# Patient Record
Sex: Male | Born: 1955 | Race: White | Hispanic: No | Marital: Married | Smoking: Former smoker
Health system: Southern US, Community
[De-identification: ages and names within clinical notes are randomized; demographics above are authoritative.]

## PROBLEM LIST (undated history)

## (undated) DIAGNOSIS — E785 Hyperlipidemia, unspecified: Secondary | ICD-10-CM

## (undated) DIAGNOSIS — I739 Peripheral vascular disease, unspecified: Secondary | ICD-10-CM

## (undated) DIAGNOSIS — I10 Essential (primary) hypertension: Secondary | ICD-10-CM

## (undated) DIAGNOSIS — M359 Systemic involvement of connective tissue, unspecified: Secondary | ICD-10-CM

---

## 2012-05-10 ENCOUNTER — Ambulatory Visit: Payer: Self-pay | Admitting: Internal Medicine

## 2015-11-01 ENCOUNTER — Other Ambulatory Visit: Payer: Self-pay | Admitting: Vascular Surgery

## 2015-11-05 ENCOUNTER — Other Ambulatory Visit
Admission: RE | Admit: 2015-11-05 | Discharge: 2015-11-05 | Disposition: A | Discharge status: Home / Self Care | Payer: BLUE CROSS/BLUE SHIELD | Source: Ambulatory Visit | Attending: Vascular Surgery | Admitting: Vascular Surgery

## 2015-11-05 DIAGNOSIS — I70213 Atherosclerosis of native arteries of extremities with intermittent claudication, bilateral legs: Secondary | ICD-10-CM | POA: Insufficient documentation

## 2015-11-05 LAB — CREATININE, SERUM: CREATININE: 0.96 mg/dL (ref 0.61–1.24)

## 2015-11-05 LAB — BUN: BUN: 13 mg/dL (ref 6–20)

## 2015-11-08 ENCOUNTER — Ambulatory Visit
Admission: RE | Admit: 2015-11-08 | Discharge: 2015-11-08 | Disposition: A | Discharge status: Home / Self Care | Payer: BLUE CROSS/BLUE SHIELD | Source: Ambulatory Visit | Attending: Vascular Surgery | Admitting: Vascular Surgery

## 2015-11-08 ENCOUNTER — Encounter: Payer: Self-pay | Admitting: *Deleted

## 2015-11-08 ENCOUNTER — Encounter
Admission: RE | Disposition: A | Discharge status: Home / Self Care | Payer: Self-pay | Source: Ambulatory Visit | Attending: Vascular Surgery

## 2015-11-08 DIAGNOSIS — I70213 Atherosclerosis of native arteries of extremities with intermittent claudication, bilateral legs: Secondary | ICD-10-CM | POA: Diagnosis present

## 2015-11-08 DIAGNOSIS — Z8249 Family history of ischemic heart disease and other diseases of the circulatory system: Secondary | ICD-10-CM | POA: Insufficient documentation

## 2015-11-08 DIAGNOSIS — Z833 Family history of diabetes mellitus: Secondary | ICD-10-CM | POA: Diagnosis not present

## 2015-11-08 DIAGNOSIS — E785 Hyperlipidemia, unspecified: Secondary | ICD-10-CM | POA: Insufficient documentation

## 2015-11-08 DIAGNOSIS — F1010 Alcohol abuse, uncomplicated: Secondary | ICD-10-CM | POA: Insufficient documentation

## 2015-11-08 DIAGNOSIS — M2550 Pain in unspecified joint: Secondary | ICD-10-CM | POA: Insufficient documentation

## 2015-11-08 DIAGNOSIS — I10 Essential (primary) hypertension: Secondary | ICD-10-CM | POA: Diagnosis not present

## 2015-11-08 DIAGNOSIS — Z823 Family history of stroke: Secondary | ICD-10-CM | POA: Diagnosis not present

## 2015-11-08 DIAGNOSIS — F17210 Nicotine dependence, cigarettes, uncomplicated: Secondary | ICD-10-CM | POA: Insufficient documentation

## 2015-11-08 DIAGNOSIS — Z809 Family history of malignant neoplasm, unspecified: Secondary | ICD-10-CM | POA: Diagnosis not present

## 2015-11-08 DIAGNOSIS — M79609 Pain in unspecified limb: Secondary | ICD-10-CM | POA: Insufficient documentation

## 2015-11-08 HISTORY — PX: PERIPHERAL VASCULAR CATHETERIZATION: SHX172C

## 2015-11-08 HISTORY — DX: Essential (primary) hypertension: I10

## 2015-11-08 HISTORY — DX: Hyperlipidemia, unspecified: E78.5

## 2015-11-08 HISTORY — DX: Peripheral vascular disease, unspecified: I73.9

## 2015-11-08 SURGERY — LOWER EXTREMITY ANGIOGRAPHY
Anesthesia: Moderate Sedation | Site: Leg Lower | Laterality: Right

## 2015-11-08 MED ORDER — LABETALOL HCL 5 MG/ML IV SOLN: 10.0000 mg | INTRAVENOUS | Status: DC | PRN

## 2015-11-08 MED ORDER — OXYCODONE-ACETAMINOPHEN 5-325 MG PO TABS: 1.0000 | ORAL_TABLET | ORAL | Status: DC | PRN

## 2015-11-08 MED ORDER — SODIUM CHLORIDE 0.9 % IV SOLN: 500.0000 mL | Freq: Once | INTRAVENOUS | Status: DC | PRN

## 2015-11-08 MED ORDER — ACETAMINOPHEN 325 MG RE SUPP
325.0000 mg | RECTAL | Status: DC | PRN
  Filled 2015-11-08: qty 2

## 2015-11-08 MED ORDER — GUAIFENESIN-DM 100-10 MG/5ML PO SYRP: 15.0000 mL | ORAL_SOLUTION | ORAL | Status: DC | PRN

## 2015-11-08 MED ORDER — HEPARIN SODIUM (PORCINE) 1000 UNIT/ML IJ SOLN
INTRAMUSCULAR | Status: AC
  Filled 2015-11-08: qty 1

## 2015-11-08 MED ORDER — FAMOTIDINE 20 MG PO TABS: 40.0000 mg | ORAL_TABLET | ORAL | Status: DC | PRN

## 2015-11-08 MED ORDER — CLOPIDOGREL BISULFATE 75 MG PO TABS: 75.0000 mg | ORAL_TABLET | Freq: Every day | ORAL | Status: DC

## 2015-11-08 MED ORDER — HYDROMORPHONE HCL 1 MG/ML IJ SOLN: 1.0000 mg | INTRAMUSCULAR | Status: DC | PRN

## 2015-11-08 MED ORDER — HEPARIN SODIUM (PORCINE) 1000 UNIT/ML IJ SOLN
INTRAMUSCULAR | Status: DC | PRN
  Administered 2015-11-08: 5000 [IU] via INTRAVENOUS
  Administered 2015-11-08: 2000 [IU] via INTRAVENOUS

## 2015-11-08 MED ORDER — ONDANSETRON HCL 4 MG/2ML IJ SOLN: 4.0000 mg | Freq: Four times a day (QID) | INTRAMUSCULAR | Status: DC | PRN

## 2015-11-08 MED ORDER — METOPROLOL TARTRATE 5 MG/5ML IV SOLN: 2.0000 mg | INTRAVENOUS | Status: DC | PRN

## 2015-11-08 MED ORDER — DIPHENHYDRAMINE HCL 50 MG/ML IJ SOLN
INTRAMUSCULAR | Status: AC
  Filled 2015-11-08: qty 1

## 2015-11-08 MED ORDER — HEPARIN (PORCINE) IN NACL 2-0.9 UNIT/ML-% IJ SOLN
INTRAMUSCULAR | Status: AC
  Filled 2015-11-08: qty 1000

## 2015-11-08 MED ORDER — MIDAZOLAM HCL 5 MG/5ML IJ SOLN
INTRAMUSCULAR | Status: AC
  Filled 2015-11-08: qty 5

## 2015-11-08 MED ORDER — DEXTROSE 5 % IV SOLN
1.5000 g | INTRAVENOUS | Status: AC
  Administered 2015-11-08: 1.5 g via INTRAVENOUS

## 2015-11-08 MED ORDER — DIPHENHYDRAMINE HCL 50 MG/ML IJ SOLN
INTRAMUSCULAR | Status: DC | PRN
  Administered 2015-11-08: 50 mg via INTRAVENOUS

## 2015-11-08 MED ORDER — FENTANYL CITRATE (PF) 100 MCG/2ML IJ SOLN
INTRAMUSCULAR | Status: AC
  Filled 2015-11-08: qty 4

## 2015-11-08 MED ORDER — HYDROMORPHONE HCL 1 MG/ML IJ SOLN
INTRAMUSCULAR | Status: AC
  Filled 2015-11-08: qty 1

## 2015-11-08 MED ORDER — HYDROMORPHONE HCL 1 MG/ML IJ SOLN
1.0000 mg | Freq: Once | INTRAMUSCULAR | Status: AC
  Administered 2015-11-08: 1 mg via INTRAVENOUS

## 2015-11-08 MED ORDER — METHYLPREDNISOLONE SODIUM SUCC 125 MG IJ SOLR: 125.0000 mg | INTRAMUSCULAR | Status: DC | PRN

## 2015-11-08 MED ORDER — MIDAZOLAM HCL 2 MG/2ML IJ SOLN
INTRAMUSCULAR | Status: AC
  Filled 2015-11-08: qty 2

## 2015-11-08 MED ORDER — FENTANYL CITRATE (PF) 100 MCG/2ML IJ SOLN
INTRAMUSCULAR | Status: DC | PRN
  Administered 2015-11-08: 25 ug via INTRAVENOUS
  Administered 2015-11-08 (×2): 50 ug via INTRAVENOUS
  Administered 2015-11-08 (×3): 25 ug via INTRAVENOUS

## 2015-11-08 MED ORDER — IOPAMIDOL (ISOVUE-300) INJECTION 61%
INTRAVENOUS | Status: DC | PRN
  Administered 2015-11-08: 100 mL via INTRA_ARTERIAL

## 2015-11-08 MED ORDER — ACETAMINOPHEN 325 MG PO TABS: 325.0000 mg | ORAL_TABLET | ORAL | Status: DC | PRN

## 2015-11-08 MED ORDER — MIDAZOLAM HCL 2 MG/2ML IJ SOLN
INTRAMUSCULAR | Status: DC | PRN
  Administered 2015-11-08: 2 mg via INTRAVENOUS
  Administered 2015-11-08 (×3): 1 mg via INTRAVENOUS
  Administered 2015-11-08: 2 mg via INTRAVENOUS
  Administered 2015-11-08: 1 mg via INTRAVENOUS

## 2015-11-08 MED ORDER — SODIUM CHLORIDE 0.9 % IV SOLN
INTRAVENOUS | Status: DC
Start: 2015-11-08 — End: 2015-11-08
  Administered 2015-11-08: 10:00:00 via INTRAVENOUS

## 2015-11-08 MED ORDER — HYDROMORPHONE HCL 1 MG/ML IJ SOLN: 0.5000 mg | INTRAMUSCULAR | Status: DC | PRN

## 2015-11-08 MED ORDER — LIDOCAINE-EPINEPHRINE (PF) 1 %-1:200000 IJ SOLN
INTRAMUSCULAR | Status: AC
  Filled 2015-11-08: qty 30

## 2015-11-08 MED ORDER — HYDRALAZINE HCL 20 MG/ML IJ SOLN: 5.0000 mg | INTRAMUSCULAR | Status: DC | PRN

## 2015-11-08 MED ORDER — PHENOL 1.4 % MT LIQD: 1.0000 | OROMUCOSAL | Status: DC | PRN

## 2015-11-08 MED ORDER — HYDROCODONE-ACETAMINOPHEN 5-325 MG PO TABS: 1.0000 | ORAL_TABLET | Freq: Four times a day (QID) | ORAL | Status: DC | PRN

## 2015-11-08 SURGICAL SUPPLY — 32 items
BALLN ARMADA 3.0X120X150 (BALLOONS) ×2
BALLN ARMADA 3X120X150 (BALLOONS) ×1
BALLN DORADO 5X200X135 (BALLOONS) ×3
BALLN LUTONIX 4X150X130 (BALLOONS) ×3
BALLN LUTONIX 5X150X130 (BALLOONS) ×3
BALLN ULTRVRSE 3X150X130 (BALLOONS) ×3
BALLN ULTRVRSE 4X200X130 (BALLOONS) ×3
BALLN ULTRVRSE 4X20X130C (BALLOONS)
BALLOON ARMADA 3X120X150 (BALLOONS) ×1 IMPLANT
BALLOON DORADO 5X200X135 (BALLOONS) ×1 IMPLANT
BALLOON LUTONIX 4X150X130 (BALLOONS) ×1 IMPLANT
BALLOON LUTONIX 5X150X130 (BALLOONS) ×1 IMPLANT
BALLOON ULTRVRSE 3X150X130 (BALLOONS) ×1 IMPLANT
BALLOON ULTRVRSE 4X200X130 (BALLOONS) ×1 IMPLANT
BALLOON ULTRVRSE 4X20X130C (BALLOONS) IMPLANT
CANNULA 5F STIFF (CANNULA) ×3 IMPLANT
CATH CXI SUPP ANG 4FR 135 (MICROCATHETER) ×1 IMPLANT
CATH CXI SUPP ANG 4FR 135CM (MICROCATHETER) ×3
CATH PIG 70CM (CATHETERS) ×3 IMPLANT
CATH VERT 100CM (CATHETERS) ×3 IMPLANT
DEVICE PRESTO INFLATION (MISCELLANEOUS) ×3 IMPLANT
DEVICE STARCLOSE SE CLOSURE (Vascular Products) ×3 IMPLANT
GLIDEWIRE ADV .035X260CM (WIRE) ×3 IMPLANT
PACK ANGIOGRAPHY (CUSTOM PROCEDURE TRAY) ×3 IMPLANT
SHEATH ANL2 6FRX45 HC (SHEATH) ×3 IMPLANT
SHEATH BRITE TIP 5FRX11 (SHEATH) ×3 IMPLANT
STENT VIABAHN 6X150X120 (Permanent Stent) ×3 IMPLANT
STENT VIABAHN 6X250X120 (Permanent Stent) ×3 IMPLANT
SYR MEDRAD MARK V 150ML (SYRINGE) ×3 IMPLANT
TUBING CONTRAST HIGH PRESS 72 (TUBING) ×3 IMPLANT
WIRE HI TORQ STEELCORE18 300CM (WIRE) ×3 IMPLANT
WIRE J 3MM .035X145CM (WIRE) ×3 IMPLANT

## 2015-11-08 NOTE — Op Note (Signed)
[facility] Percutaneous Study/Intervention Procedural Note   Date of Surgery: 11/08/2015  Surgeon(s):Jayli Fogleman   Assistants:none  Pre-operative Diagnosis: PAD with claudication bilateral lower extremities  Post-operative diagnosis: Same  Procedure(s) Performed: 1. Ultrasound guidance for vascular access left femoral artery 2. Catheter placement into right anterior tibial artery from left femoral approach 3. Aortogram and selective right lower extremity angiogram 4. Percutaneous transluminal angioplasty of right anterior tibial artery with 3 mm diameter by 12 cm length angioplasty balloon 5. Percutaneous transluminal angioplasty of the right popliteal artery with 4 mm diameter by 15 cm length Lutonix drug-coated angioplasty balloon and percutaneous transluminal angioplasty of the right superficial femoral artery with 5 mm diameter angioplasty balloon including a 5 mm diameter by 15 cm length Lutonix drug-coated balloon proximally  6.  Viabahn covered stent placement 2 to the right SFA and popliteal artery for multiple areas of greater than 50% residual stenosis and dissection using a 6 mm diameter by 25 cm length stent and a 6 mm diameter by 15 cm length stent 7. StarClose closure device left femoral artery  EBL: 25 cc  Contrast: 100 cc  Fluoro Time: 29.3 minutes  Moderate Conscious Sedation Time: approximately 90 minutes using 8 mg of Versed and 200 mcg of Fentanyl  Indications: Patient is a 60 y.o.male with short distance claudication in both lower extremities worse on the right. The patient has noninvasive study showing markedly reduced ABIs with bilateral SFA occlusions. The patient is brought in for angiography for further evaluation and potential treatment. Risks and benefits are discussed and informed consent is obtained  Procedure: The patient was identified  and appropriate procedural time out was performed. The patient was then placed supine on the table and prepped and draped in the usual sterile fashion.Moderate conscious sedation was administered during a face to face encounter with the patient throughout the procedure with my supervision of the RN administering medicines and monitoring the patient's vital signs, pulse oximetry, telemetry and mental status throughout from the start of the procedure until the patient was taken to the recovery room. Ultrasound was used to evaluate the left common femoral artery. It was patent . A digital ultrasound image was acquired. A Seldinger needle was used to access the left common femoral artery under direct ultrasound guidance and a permanent image was performed. A 0.035 J wire was advanced without resistance and a 5Fr sheath was placed. Pigtail catheter was placed into the aorta and an AP aortogram was performed. This demonstrated normal renal arteries and normal aorta and iliac segments without significant stenosis. I then crossed the aortic bifurcation and advanced to the right femoral head. Selective right lower extremity angiogram was then performed. This demonstrated Moderate stenosis in the common femoral artery of 50-60%, diffusely diseased SFA and popliteal artery with multiple areas of stenosis and occlusion throughout their course with complete occlusion of the popliteal artery and distal reconstitution of the posterior tibial artery and anterior tibial artery distally with occlusion of the tibioperoneal trunk and proximal anterior tibial artery as well. Very calcific disease. The patient was systemically heparinized and a 6 [place] Ansell sheath was then placed over the [employer] wire. I then used a Kumpe catheter and the advantage wire to navigate through the occlusion. This was a dense calcific lesion and was very difficult to cross with multiple different wires and catheters including a CXI catheter  and a steel core wire but ultimately the CXI catheter and the advantage wire were able to gain access  to the anterior tibial artery and confirm intraluminal flow. I exchanged for a 0.018 wire. The proximal anterior tibial artery occlusion was treated with a 3 mm diameter by 12 cm length angioplasty balloon. This was inflated to 12 atm for 1 minute. I predilated the popliteal and SFA with a 3 and a 4 mm balloon and then used a 4 mm diameter by 15 cm length Lutonix drug-coated angioplasty balloon inflated to 10 atm for 1 minute to treat the popliteal artery. The proximal SFA was treated with a 5 mm diameter by 15 cm length Lutonix drug-coated angioplasty balloon inflated to 12 atm for 1 minute. Several additional inflations with this 5 mm balloon were performed to treat the entire SFA. Despite this, there was a lot of residual stenosis and dissection throughout the SFA and popliteal artery with the severe calcific disease. 2 Viabahn covered stents were used to treat from the knee up to the proximal superficial femoral artery to encompass the residual disease that remained after angioplasty. The first stent was a 6 mm diameter by 25 cm length stent and the second stent was a 6 mm diameter by 15 cm length stent. 5 mm diameter high pressure balloon was used to post dilate this. At this point, completion angiogram showed 40-50% residual disease in the common femoral artery and SFA origin that was an area that we were not going to place a stent. The stented SFA and popliteal artery had no greater than 30% residual stenosis. There was about a 40% residual stenosis in the proximal anterior tibial artery but this was not flow limiting and he had brisk flow distally. He also now had flow through the posterior tibial artery and tibioperoneal trunk although these vessels were quite small. At this point, I filled his perfusion had been improved his much as possible. I elected to terminate the procedure. The sheath was removed and  StarClose closure device was deployed in the left femoral artery with excellent hemostatic result. The patient was taken to the recovery room in stable condition having tolerated the procedure well.  Findings:  Aortogram: Renal arteries appeared patent bilaterally. Aorta had calcification but no significant stenosis. Iliac arteries also had significant calcification with mild stenosis of less than 30% but nothing that appeared flow limiting. Right Lower Extremity: Moderate stenosis in the common femoral artery of 50-60%, diffusely diseased SFA and popliteal artery with multiple areas of stenosis and occlusion throughout their course with complete occlusion of the popliteal artery and distal reconstitution of the posterior tibial artery and anterior tibial artery distally with occlusion of the tibioperoneal trunk and proximal anterior tibial artery as well. Very calcific disease.   Disposition: Patient was taken to the recovery room in stable condition having tolerated the procedure well.  Complications: None  Marcoantonio Legault 11/08/2015 1:20 PM

## 2015-11-08 NOTE — OR Nursing (Signed)
PAD removed after being deflated X 20 minutes, site dressed with guaze & tegaderm. Stable upon discharge.

## 2015-11-08 NOTE — Discharge Instructions (Signed)
Groin Insertion Instructions-If you lose feeling or develop tingling or pain in your leg or foot after the procedure, please walk around first.  If the discomfort does not improve , contact your physician and proceed to the nearest emergency room.  Loss of feeling in your leg might mean that a blockage has formed in the artery and this can be appropriately treated.  Limit your activity for the next two days after your procedure.  Avoid stooping, bending, heavy lifting or exertion as this may put pressure on the insertion site.  Resume normal activities in 48 hours.  You may shower after 24 hours but avoid excessive warm water and do not scrub the site.  Remove clear dressing in 48 hours.  If you have had a closure device inserted, do not soak in a tub bath or a hot tub for at least one week. ° °No driving for 48 hours after discharge.  After the procedure, check the insertion site occasionally.  If any oozing occurs or there is apparent swelling, firm pressure over the site will prevent a bruise from forming.  You can not hurt anything by pressing directly on the site.  The pressure stops the bleeding by allowing a small clot to form.  If the bleeding continues after the pressure has been applied for more than 15 minutes, call 911 or go to the nearest emergency room.   ° °The x-ray dye causes you to pass a considerate amount of urine.  For this reason, you will be asked to drink plenty of liquids after the procedure to prevent dehydration.  You may resume you regular diet.  Avoid caffeine products.   ° °For pain at the site of your procedure, take non-aspirin medicines such as Tylenol. ° °Medications: A. Hold Metformin for 48 hours if applicable.  B. Continue taking all your present medications at home unless your doctor prescribes any changes.The drugs you were given will stay in your system until tomorrow, so for the next 24 hours you should not.  Drive an automobile. Make any legal decisions.  Drink any alcoholic  beverages. ° °Today you should start with liquids and gradually work up to solid foods as your are able to tolerate them ° °Resume your regular medications as prescribed by your doctor.  Avoid aspirin for 24 hours.   ° °Change the Band-Aid or dressing as needed.  After a 2 days no dressing as needed. ° °Avoid strenuous activity for the remainder of the day. ° °Please notify your primary physician immediately if you have any unusual bleeding, trouble breathing, fever >100 degrees or pain not relieved by the medication your doctor prescribed for your doctor prescribed for you physician ° °Return to diaslysis  tommorow. °

## 2015-11-08 NOTE — H&P (Signed)
  [facility] History & Physical Update  The patient was interviewed and re-examined.  The patient's previous History and Physical has been reviewed and is unchanged.  There is no change in the plan of care. We plan to proceed with the scheduled procedure.  Mattix Imhof, MD  11/08/2015, 9:19 AM

## 2015-11-09 ENCOUNTER — Encounter: Payer: Self-pay | Admitting: Vascular Surgery

## 2015-12-08 ENCOUNTER — Other Ambulatory Visit: Payer: Self-pay | Admitting: Vascular Surgery

## 2015-12-10 ENCOUNTER — Other Ambulatory Visit
Admission: RE | Admit: 2015-12-10 | Discharge: 2015-12-10 | Disposition: A | Discharge status: Home / Self Care | Payer: BLUE CROSS/BLUE SHIELD | Source: Ambulatory Visit | Attending: Vascular Surgery | Admitting: Vascular Surgery

## 2015-12-10 DIAGNOSIS — Z029 Encounter for administrative examinations, unspecified: Secondary | ICD-10-CM | POA: Insufficient documentation

## 2015-12-10 LAB — BUN: BUN: 14 mg/dL (ref 6–20)

## 2015-12-10 LAB — CREATININE, SERUM
Creatinine, Ser: 0.97 mg/dL (ref 0.61–1.24)
GFR calc Af Amer: >60 mL/min (ref >60)
GFR calc non Af Amer: >60 mL/min (ref >60)

## 2015-12-12 MED ORDER — DEXTROSE 5 % IV SOLN
1.5000 g | INTRAVENOUS | Status: AC
  Administered 2015-12-13: 1.5 g via INTRAVENOUS

## 2015-12-13 ENCOUNTER — Ambulatory Visit: Payer: BLUE CROSS/BLUE SHIELD | Admitting: Anesthesiology

## 2015-12-13 ENCOUNTER — Encounter: Payer: Self-pay | Admitting: *Deleted

## 2015-12-13 ENCOUNTER — Encounter
Admission: RE | Disposition: A | Discharge status: Home / Self Care | Payer: Self-pay | Source: Ambulatory Visit | Attending: Vascular Surgery

## 2015-12-13 ENCOUNTER — Observation Stay
Admission: RE | Admit: 2015-12-13 | Discharge: 2015-12-14 | Disposition: A | Discharge status: Home / Self Care | Payer: BLUE CROSS/BLUE SHIELD | Source: Ambulatory Visit | Attending: Vascular Surgery | Admitting: Vascular Surgery

## 2015-12-13 DIAGNOSIS — M2560 Stiffness of unspecified joint, not elsewhere classified: Secondary | ICD-10-CM | POA: Insufficient documentation

## 2015-12-13 DIAGNOSIS — Z8249 Family history of ischemic heart disease and other diseases of the circulatory system: Secondary | ICD-10-CM | POA: Diagnosis not present

## 2015-12-13 DIAGNOSIS — Z87891 Personal history of nicotine dependence: Secondary | ICD-10-CM | POA: Diagnosis not present

## 2015-12-13 DIAGNOSIS — Z833 Family history of diabetes mellitus: Secondary | ICD-10-CM | POA: Diagnosis not present

## 2015-12-13 DIAGNOSIS — I10 Essential (primary) hypertension: Secondary | ICD-10-CM | POA: Insufficient documentation

## 2015-12-13 DIAGNOSIS — I998 Other disorder of circulatory system: Secondary | ICD-10-CM | POA: Diagnosis present

## 2015-12-13 DIAGNOSIS — I70221 Atherosclerosis of native arteries of extremities with rest pain, right leg: Principal | ICD-10-CM | POA: Insufficient documentation

## 2015-12-13 DIAGNOSIS — E785 Hyperlipidemia, unspecified: Secondary | ICD-10-CM | POA: Diagnosis not present

## 2015-12-13 DIAGNOSIS — Z823 Family history of stroke: Secondary | ICD-10-CM | POA: Diagnosis not present

## 2015-12-13 HISTORY — PX: PERIPHERAL VASCULAR CATHETERIZATION: SHX172C

## 2015-12-13 SURGERY — LOWER EXTREMITY ANGIOGRAPHY
Anesthesia: General | Laterality: Right

## 2015-12-13 MED ORDER — ONDANSETRON HCL 4 MG/2ML IJ SOLN: 4.0000 mg | Freq: Four times a day (QID) | INTRAMUSCULAR | Status: DC | PRN

## 2015-12-13 MED ORDER — ASPIRIN EC 81 MG PO TBEC
81.0000 mg | DELAYED_RELEASE_TABLET | Freq: Every day | ORAL | Status: DC
  Administered 2015-12-13: 81 mg via ORAL
  Filled 2015-12-13: qty 1

## 2015-12-13 MED ORDER — HEPARIN (PORCINE) IN NACL 2-0.9 UNIT/ML-% IJ SOLN
INTRAMUSCULAR | Status: AC
Start: 2015-12-13 — End: 2015-12-13
  Filled 2015-12-13: qty 1000

## 2015-12-13 MED ORDER — HYDROMORPHONE HCL 1 MG/ML IJ SOLN
1.0000 mg | Freq: Once | INTRAMUSCULAR | Status: AC
  Administered 2015-12-13: 1 mg via INTRAVENOUS

## 2015-12-13 MED ORDER — PROMETHAZINE HCL 25 MG/ML IJ SOLN
6.2500 mg | INTRAMUSCULAR | Status: DC | PRN
  Filled 2015-12-13: qty 1

## 2015-12-13 MED ORDER — ONDANSETRON HCL 4 MG/2ML IJ SOLN
4.0000 mg | Freq: Four times a day (QID) | INTRAMUSCULAR | Status: DC | PRN
  Administered 2015-12-13 – 2015-12-14 (×2): 4 mg via INTRAVENOUS
  Filled 2015-12-13 (×2): qty 2

## 2015-12-13 MED ORDER — SODIUM CHLORIDE 0.9 % IV SOLN
INTRAVENOUS | Status: DC
  Administered 2015-12-13: 10:00:00 via INTRAVENOUS

## 2015-12-13 MED ORDER — ROSUVASTATIN CALCIUM 10 MG PO TABS
20.0000 mg | ORAL_TABLET | Freq: Every day | ORAL | Status: DC
  Administered 2015-12-13: 20 mg via ORAL
  Filled 2015-12-13: qty 2

## 2015-12-13 MED ORDER — SODIUM CHLORIDE 0.9% FLUSH
3.0000 mL | Freq: Two times a day (BID) | INTRAVENOUS | Status: DC
  Administered 2015-12-13: 3 mL via INTRAVENOUS

## 2015-12-13 MED ORDER — OXYCODONE HCL 5 MG PO TABS
10.0000 mg | ORAL_TABLET | ORAL | Status: DC | PRN
  Administered 2015-12-13 – 2015-12-14 (×2): 10 mg via ORAL
  Filled 2015-12-13 (×2): qty 2

## 2015-12-13 MED ORDER — IBUPROFEN 200 MG PO TABS: 200.0000 mg | ORAL_TABLET | Freq: Four times a day (QID) | ORAL | Status: DC | PRN

## 2015-12-13 MED ORDER — AMLODIPINE BESYLATE 5 MG PO TABS
5.0000 mg | ORAL_TABLET | Freq: Every day | ORAL | Status: DC
  Administered 2015-12-13: 5 mg via ORAL
  Filled 2015-12-13: qty 1

## 2015-12-13 MED ORDER — OXYCODONE HCL 5 MG PO TABS: 5.0000 mg | ORAL_TABLET | ORAL | Status: DC | PRN

## 2015-12-13 MED ORDER — FLEET ENEMA 7-19 GM/118ML RE ENEM: 1.0000 | ENEMA | Freq: Once | RECTAL | Status: DC | PRN

## 2015-12-13 MED ORDER — DEXTROSE-NACL 5-0.9 % IV SOLN
INTRAVENOUS | Status: DC
  Administered 2015-12-13: 1000 mL via INTRAVENOUS

## 2015-12-13 MED ORDER — SORBITOL 70 % SOLN
30.0000 mL | Freq: Every day | Status: DC | PRN
  Filled 2015-12-13: qty 30

## 2015-12-13 MED ORDER — MORPHINE SULFATE (PF) 2 MG/ML IV SOLN
2.0000 mg | INTRAVENOUS | Status: DC | PRN
  Administered 2015-12-13 – 2015-12-14 (×4): 2 mg via INTRAVENOUS
  Filled 2015-12-13 (×4): qty 1

## 2015-12-13 MED ORDER — LIDOCAINE HCL (CARDIAC) 20 MG/ML IV SOLN
INTRAVENOUS | Status: DC | PRN
  Administered 2015-12-13: 60 mg via INTRAVENOUS

## 2015-12-13 MED ORDER — IOPAMIDOL (ISOVUE-300) INJECTION 61%
INTRAVENOUS | Status: DC | PRN
  Administered 2015-12-13: 105 mL via INTRAVENOUS

## 2015-12-13 MED ORDER — MAGNESIUM HYDROXIDE 400 MG/5ML PO SUSP: 30.0000 mL | Freq: Every day | ORAL | Status: DC | PRN

## 2015-12-13 MED ORDER — METHYLPREDNISOLONE SODIUM SUCC 125 MG IJ SOLR: 125.0000 mg | INTRAMUSCULAR | Status: DC | PRN

## 2015-12-13 MED ORDER — HEPARIN SODIUM (PORCINE) 1000 UNIT/ML IJ SOLN
INTRAMUSCULAR | Status: DC | PRN
  Administered 2015-12-13: 5000 [IU] via INTRAVENOUS

## 2015-12-13 MED ORDER — TIROFIBAN HCL IN NACL 5-0.9 MG/100ML-% IV SOLN
0.1500 ug/kg/min | INTRAVENOUS | Status: DC
  Administered 2015-12-13 – 2015-12-14 (×2): 0.15 ug/kg/min via INTRAVENOUS
  Filled 2015-12-13 (×5): qty 100

## 2015-12-13 MED ORDER — DOCUSATE SODIUM 100 MG PO CAPS
100.0000 mg | ORAL_CAPSULE | Freq: Two times a day (BID) | ORAL | Status: DC
  Administered 2015-12-13: 100 mg via ORAL
  Filled 2015-12-13: qty 1

## 2015-12-13 MED ORDER — ACETAMINOPHEN 650 MG RE SUPP: 650.0000 mg | Freq: Four times a day (QID) | RECTAL | Status: DC | PRN

## 2015-12-13 MED ORDER — PROPOFOL 500 MG/50ML IV EMUL
INTRAVENOUS | Status: DC | PRN
  Administered 2015-12-13: 100 ug/kg/min via INTRAVENOUS

## 2015-12-13 MED ORDER — PHENYLEPHRINE HCL 10 MG/ML IJ SOLN
INTRAMUSCULAR | Status: DC | PRN
  Administered 2015-12-13: 100 ug via INTRAVENOUS
  Administered 2015-12-13: 50 ug via INTRAVENOUS

## 2015-12-13 MED ORDER — MORPHINE SULFATE (PF) 2 MG/ML IV SOLN
2.0000 mg | INTRAVENOUS | Status: DC | PRN
  Administered 2015-12-13: 2 mg via INTRAVENOUS
  Filled 2015-12-13: qty 1

## 2015-12-13 MED ORDER — PROPOFOL 10 MG/ML IV BOLUS
INTRAVENOUS | Status: DC | PRN
  Administered 2015-12-13: 20 mg via INTRAVENOUS
  Administered 2015-12-13: 80 mg via INTRAVENOUS

## 2015-12-13 MED ORDER — ADULT MULTIVITAMIN W/MINERALS CH
1.0000 | ORAL_TABLET | Freq: Every evening | ORAL | Status: DC
  Administered 2015-12-13: 1 via ORAL
  Filled 2015-12-13: qty 1

## 2015-12-13 MED ORDER — ALTEPLASE 2 MG IJ SOLR
INTRAMUSCULAR | Status: DC | PRN
  Administered 2015-12-13: 8 mg

## 2015-12-13 MED ORDER — FENTANYL CITRATE (PF) 100 MCG/2ML IJ SOLN
25.0000 ug | INTRAMUSCULAR | Status: DC | PRN
  Administered 2015-12-13 (×3): 50 ug via INTRAVENOUS

## 2015-12-13 MED ORDER — MIDAZOLAM HCL 2 MG/2ML IJ SOLN
INTRAMUSCULAR | Status: DC | PRN
Start: 2015-12-13 — End: 2015-12-13
  Administered 2015-12-13 (×2): 1 mg via INTRAVENOUS

## 2015-12-13 MED ORDER — MORPHINE SULFATE (PF) 2 MG/ML IV SOLN: 2.0000 mg | INTRAVENOUS | Status: DC | PRN

## 2015-12-13 MED ORDER — APIXABAN 2.5 MG PO TABS
5.0000 mg | ORAL_TABLET | Freq: Two times a day (BID) | ORAL | Status: DC
Start: 2015-12-13 — End: 2015-12-14
  Administered 2015-12-13: 5 mg via ORAL
  Filled 2015-12-13: qty 2

## 2015-12-13 MED ORDER — ACETAMINOPHEN 325 MG PO TABS
650.0000 mg | ORAL_TABLET | Freq: Four times a day (QID) | ORAL | Status: DC | PRN
Start: 2015-12-13 — End: 2015-12-14
  Filled 2015-12-13: qty 2

## 2015-12-13 MED ORDER — LIDOCAINE-EPINEPHRINE (PF) 1 %-1:200000 IJ SOLN
INTRAMUSCULAR | Status: AC
  Filled 2015-12-13: qty 30

## 2015-12-13 MED ORDER — OMEGA-3-ACID ETHYL ESTERS 1 G PO CAPS
1.0000 g | ORAL_CAPSULE | Freq: Every day | ORAL | Status: DC
  Administered 2015-12-13: 1 g via ORAL
  Filled 2015-12-13: qty 1

## 2015-12-13 MED ORDER — BENAZEPRIL HCL 20 MG PO TABS
20.0000 mg | ORAL_TABLET | Freq: Every day | ORAL | Status: DC
  Administered 2015-12-13: 20 mg via ORAL
  Filled 2015-12-13: qty 1

## 2015-12-13 MED ORDER — ONDANSETRON HCL 4 MG PO TABS
4.0000 mg | ORAL_TABLET | Freq: Four times a day (QID) | ORAL | Status: DC | PRN
  Filled 2015-12-13: qty 1

## 2015-12-13 MED ORDER — HYDROMORPHONE HCL 1 MG/ML IJ SOLN: 1.0000 mg | Freq: Once | INTRAMUSCULAR | Status: DC

## 2015-12-13 MED ORDER — FENTANYL CITRATE (PF) 100 MCG/2ML IJ SOLN
INTRAMUSCULAR | Status: DC | PRN
  Administered 2015-12-13: 25 ug via INTRAVENOUS
  Administered 2015-12-13: 12.5 ug via INTRAVENOUS
  Administered 2015-12-13 (×2): 25 ug via INTRAVENOUS
  Administered 2015-12-13: 50 ug via INTRAVENOUS
  Administered 2015-12-13: 25 ug via INTRAVENOUS
  Administered 2015-12-13: 12.5 ug via INTRAVENOUS
  Administered 2015-12-13: 25 ug via INTRAVENOUS

## 2015-12-13 MED ORDER — FAMOTIDINE 20 MG PO TABS: 40.0000 mg | ORAL_TABLET | ORAL | Status: DC | PRN

## 2015-12-13 SURGICAL SUPPLY — 24 items
BALLN ARMADA 5.0X100X150 (BALLOONS) ×3
BALLN DORADO 4X200X135 (BALLOONS) ×3
BALLN LUTONIX 4X150X130 (BALLOONS) ×3
BALLN LUTONIX 6X150X130 (BALLOONS) ×3
BALLN ULTRVRSE 2.5X220X150 (BALLOONS) ×3
BALLN ULTRVRSE 3X300X150 (BALLOONS) ×2
BALLN ULTRVRSE 3X300X150 OTW (BALLOONS) ×1
CATH CXI SUPP ANG 2.6FR 150CM (MICROCATHETER) ×3
CATH KA2 5FR 65CM (CATHETERS) ×3
CATH PIG 70CM (CATHETERS) ×3
CATH VERT 100CM (CATHETERS) ×3
DEVICE PRESTO INFLATION (MISCELLANEOUS) ×3
DEVICE SOLENT OMNI 120CM (CATHETERS) ×3
DEVICE STARCLOSE SE CLOSURE (Vascular Products) ×3 IMPLANT
GLIDEWIRE ADV .035X260CM (WIRE) ×3
LIFESTENT 6X60X130 (Permanent Stent) ×3 IMPLANT
PACK ANGIOGRAPHY (CUSTOM PROCEDURE TRAY) ×3
SHEATH ANL2 6FRX45 HC (SHEATH) ×3
SHEATH BRITE TIP 5FRX11 (SHEATH) ×3
STENT VIABAHN 5X100X120 (Permanent Stent) ×3 IMPLANT
SYR MEDRAD MARK V 150ML (SYRINGE) ×3
TUBING CONTRAST HIGH PRESS 72 (TUBING) ×3
WIRE G V18X300CM (WIRE) ×3
WIRE J 3MM .035X145CM (WIRE) ×3

## 2015-12-13 NOTE — H&P (Signed)
  [facility] History & Physical Update  The patient was interviewed and re-examined.  The patient's previous History and Physical has been reviewed and is unchanged.  There is no change in the plan of care. We plan to proceed with the scheduled procedure.  Jude Linck, MD  12/13/2015, 9:31 AM

## 2015-12-13 NOTE — Transfer of Care (Signed)
Immediate Anesthesia Transfer of Care Note  Patient: Travis Salas  Procedure(s) Performed: Procedure(s): Lower Extremity Angiography (Right)  Patient Location: PACU  Anesthesia Type:General  Level of Consciousness: awake and patient cooperative  Airway & Oxygen Therapy: Patient Spontanous Breathing and Patient connected to face mask oxygen  Post-op Assessment: Report given to RN and Post -op Vital signs reviewed and stable  Post vital signs: Reviewed and stable  Last Vitals:  Vitals:   12/13/15 0923 12/13/15 1435  BP: (!) 145/81 (!) (P) 172/95  Pulse: 76 85  Resp: 16 16  Temp: 37 C 98.64F    Last Pain:  Vitals:   12/13/15 0923  PainSc: 5          Complications: No apparent anesthesia complications

## 2015-12-13 NOTE — Anesthesia Preprocedure Evaluation (Signed)
Anesthesia Evaluation  Patient identified by MRN, date of birth, ID band Patient awake    Reviewed: Allergy & Precautions, NPO status , Patient's Chart, lab work & pertinent test results  History of Anesthesia Complications Negative for: history of anesthetic complications  Airway Mallampati: II  TM Distance: >3 FB Neck ROM: Full    Dental  (+) Upper Dentures, Lower Dentures   Pulmonary neg COPD, former smoker,    breath sounds clear to auscultation- rhonchi (-) wheezing      Cardiovascular Exercise Tolerance: Good hypertension, Pt. on medications + Peripheral Vascular Disease  (-) CAD and (-) Past MI  Rhythm:Regular Rate:Normal - Systolic murmurs and - Diastolic murmurs    Neuro/Psych negative psych ROS   GI/Hepatic negative GI ROS, Neg liver ROS,   Endo/Other  negative endocrine ROSneg diabetes  Renal/GU negative Renal ROS     Musculoskeletal negative musculoskeletal ROS (+)   Abdominal (+) - obese,   Peds  Hematology negative hematology ROS (+)   Anesthesia Other Findings Past Medical History: No date: Hyperlipidemia No date: Hypertension No date: Peripheral vascular disease (HCC)   Reproductive/Obstetrics                             Anesthesia Physical Anesthesia Plan  ASA: II  Anesthesia Plan: General   Post-op Pain Management:    Induction: Intravenous  Airway Management Planned: Natural Airway and LMA  Additional Equipment:   Intra-op Plan:   Post-operative Plan:   Informed Consent: I have reviewed the patients History and Physical, chart, labs and discussed the procedure including the risks, benefits and alternatives for the proposed anesthesia with the patient or authorized representative who has indicated his/her understanding and acceptance.   Dental advisory given  Plan Discussed with: CRNA and Anesthesiologist  Anesthesia Plan Comments:          Anesthesia Quick Evaluation

## 2015-12-13 NOTE — Progress Notes (Signed)
Patient lying in bed with wife at the bedside. Morphine given for pain per PRN order. Currently patient complains of mild nausea but antiemetic not due until 2300. Will administer when medication due and will continue to monitor patient to end of shift.

## 2015-12-13 NOTE — Op Note (Signed)
[facility] Percutaneous Study/Intervention Procedural Note   Date of Surgery: 12/13/2015  Surgeon(s):Jeriko Kowalke   Assistants:none  Pre-operative Diagnosis: PAD with rest pain right lower extremity status post previous intervention with recurrent occlusive disease  Post-operative diagnosis: Same  Procedure(s) Performed: 1. Ultrasound guidance for vascular access left femoral artery 2. Catheter placement into right anterior tibial artery from left femoral approach 3. Aortogram and selective right lower extremity angiogram 4. Percutaneous transluminal angioplasty of right anterior tibial artery with 2.5 and 3 mm diameter angioplasty balloons 5. Catheter directed thrombolysis with 8 mg of TPA delivered with the AngioJet Omni catheter and a power pulse spray fashion to the right SFA and popliteal arteries  6.  Mechanical rheolytic thrombectomy with the AngioJet Omni catheter to the right SFA, popliteal, and anterior tibial arteries  7.  Percutaneous transluminal angioplasty of the popliteal artery with 4 mm diameter Lutonix drug-coated angioplasty balloon  8.  Percutaneous transluminal angioplasty of the proximal SFA with 6 mm diameter Lutonix drug-coated angioplasty balloon  9.  Viabahn covered stent placement to right popliteal artery for residual stenosis and thrombosis after above procedures using a 5 mm diameter x 10 cm length stent  10. Self-expanding stent placement to the right proximal superficial femoral artery for greater than 50% residual stenosis and dissection after angioplasty using a 6 mm diameter by 6 cm length life stent 11. StarClose closure device left femoral artery  EBL: 25 cc  Contrast: 105 cc  Fluoro Time: 18 minutes  Anesthesia: general  Indications: Patient is a 60 y.o.male with severe peripheral vascular disease status post right lower  extremity revascularization earlier this year. He presented with worsening pain now with ischemic rest pain in the right leg. The patient has noninvasive study showing a markedly reduced ABI 0.3 range on the right in a stable but severely reduced ABI at 0.5 on the left. This would be consistent with occlusion of his previous intervention on the right. The patient is brought in for angiography for further evaluation and potential treatment. Risks and benefits are discussed and informed consent is obtained  Procedure: The patient was identified and appropriate procedural time out was performed. The patient was then placed supine on the table and prepped and draped in the usual sterile fashion.Anesthesia provided a general anesthetic for the procedure. Ultrasound was used to evaluate the left common femoral artery. It was patent . A digital ultrasound image was acquired. A Seldinger needle was used to access the left common femoral artery under direct ultrasound guidance and a permanent image was performed. A 0.035 J wire was advanced without resistance and a 5Fr sheath was placed. Pigtail catheter was placed into the aorta and an AP aortogram was performed. This demonstrated normal renal arteries and normal aorta and iliac segments without significant stenosis. I then crossed the aortic bifurcation and advanced to the right femoral head. Selective right lower extremity angiogram was then performed. This demonstrated near flush occlusion of the right SFA above the previously placed stent with stenosis in the common femoral artery as well as stenosis in the profunda femoris artery. There was reconstitution of the anterior tibial artery in the mid to distal segment and also faint reconstitution of a posterior tibial artery distally. The patient was systemically heparinized and a 6 [place] Ansell sheath was then placed over the [employer] wire. I then used a Kumpe catheter and the advantage wire to navigate  through the occlusion and gain access into the anterior tibial artery. I was  able to cross the occlusion in the anterior tibial artery with a CXI catheter and a 0.018 wire and confirm intraluminal flow in the distal anterior tibial artery near the foot. The 018 wire was then replaced. 8 mg of TPA were deployed with the AngioJet Omni catheter to the SFA and popliteal artery and a power pulse spray fashion. Initially, before angioplasty this would not track into the anterior tibial artery stent TPA was administered where the catheter wouldn't track. This was allowed to dwell for 15 minutes. I then performed balloon angioplasty of the anterior tibial artery with a 3 mm diameter by 30 cm length angioplasty balloon in the proximal and mid segments. For the more distal anterior tibial artery 2.5 mm x 22 cm length angioplasty balloon was inflated. Each of these inflations were for 1 minute. The initial balloon inflations were to 8 atm in both parts of the anterior tibial artery. With this, I was then able to perform mechanical rheolytic thrombectomy throughout the entire SFA, popliteal arteries, and in the proximal portions of the anterior tibial artery for thrombosis. Following this, a stenosis was seen in the proximal SFA as well as in the popliteal artery and the proximal anterior tibial artery continued to have stenosis despite angioplasty. In the popliteal artery, I used a 4 mm diameter by 15 cm length Lutonix drug-coated angioplasty balloon. This was inflated to 12 atm for 1 minute. In the proximal SFA, a 6 mm diameter by 15 cm length angioplasty balloon was used to treat the proximal SFA and common femoral artery. This was inflated to 12 atm for 1 minute. Following this, there was also an area of stenosis in the previous SFA stent and I elected to treat this area with a 6 mm diameter by 15 cm length balloon as well. Proximally, there was a bad spiral dissection with high-grade residual stenosis about 2 cm beyond the  origin of the SFA. I took a 6 mm diameter by 6 cm length noncovered life stent and deployed this about 1 cm below the origin of the SFA just above the area of stenosis and dissection and going down distally into the previously placed stent. Less than 20% residual stenosis was present in the proximal SFA after this. There was an area of extravasation in the mid to distal anterior tibial artery. I took the 2.5 mm diameter by 22 cm length angioplasty balloon and inflated in this area for approximately 3 minutes up to 8 atm. The more proximal anterior tibial artery was treated with a burst inflation of the 3 mm diameter angioplasty balloon. The popliteal artery was retreated with a 4 mm diameter high pressure angioplasty balloon. Nonetheless, the popliteal artery continued to have a residual high-grade stenosis and dissection. A 5 mm diameter by 10 cm length Viabahn covered stent was taken from the previously placed stent in the popliteal artery down to just above the origin of the anterior tibial artery. This was postdilated with a 5 mm balloon proximally and a 4 mm balloon distally. There was no further extravasation seen in the mid to distal anterior tibial artery and the anterior tibial artery after the first 8-10 cm was widely patent without significant stenosis after the above procedures. In the proximal anterior tibial artery there was a dissection and about 40-50% residual stenosis, but this was not a location where a stent would likely be durable. I elected to re-balloon the area again with a 4 mm balloon but little improvement was seen. The patient will  be kept on Aggrastat and transition to full anticoagulation for his high risk of recurrent thrombosis.. I elected to terminate the procedure. The sheath was removed and StarClose closure device was deployed in the left femoral artery with excellent hemostatic result. The patient was taken to the recovery room in stable condition having tolerated the procedure  well.  Findings:  Aortogram: No significant stenosis of the aorta, iliac arteries, and renal arteries appeared present, but calcific disease is present throughout the aorta and iliac arteries Right Lower Extremity: near flush occlusion of the right SFA above the previously placed stent with stenosis in the common femoral artery as well as stenosis in the profunda femoris artery. There was reconstitution of the anterior tibial artery in the mid to distal segment and also faint reconstitution of a posterior tibial artery distally   Disposition: Patient was taken to the recovery room in stable condition having tolerated the procedure well.  Complications: None  Lason Eveland 12/13/2015 2:21 PM

## 2015-12-13 NOTE — Anesthesia Procedure Notes (Signed)
Procedure Name: LMA Insertion Date/Time: 12/13/2015 12:53 PM Performed by: Lily Kocher Pre-anesthesia Checklist: Patient identified, Patient being monitored, Timeout performed, Emergency Drugs available and Suction available Patient Re-evaluated:Patient Re-evaluated prior to inductionOxygen Delivery Method: Circle system utilized Preoxygenation: Pre-oxygenation with 100% oxygen Intubation Type: IV induction Ventilation: Mask ventilation without difficulty LMA: LMA inserted LMA Size: 5.0 Tube type: Oral Number of attempts: 1 Placement Confirmation: positive ETCO2 and breath sounds checked- equal and bilateral Tube secured with: Tape Dental Injury: Teeth and Oropharynx as per pre-operative assessment  Comments: Upper dentures removed prior to LMA insertion; dentures placed in denture cup labeled with patient sticker

## 2015-12-14 ENCOUNTER — Encounter: Payer: Self-pay | Admitting: Vascular Surgery

## 2015-12-14 DIAGNOSIS — I70221 Atherosclerosis of native arteries of extremities with rest pain, right leg: Secondary | ICD-10-CM | POA: Diagnosis not present

## 2015-12-14 LAB — BASIC METABOLIC PANEL
ANION GAP: 6 (ref 5–15)
BUN: 9 mg/dL (ref 6–20)
CO2: 31 mmol/L (ref 22–32)
Calcium: 9 mg/dL (ref 8.9–10.3)
Chloride: 99 mmol/L — ABNORMAL LOW (ref 101–111)
Creatinine, Ser: 0.62 mg/dL (ref 0.61–1.24)
Glucose, Bld: 137 mg/dL — ABNORMAL HIGH (ref 65–99)
POTASSIUM: 3.4 mmol/L — AB (ref 3.5–5.1)
SODIUM: 136 mmol/L (ref 135–145)

## 2015-12-14 LAB — MAGNESIUM: Magnesium: 1.9 mg/dL (ref 1.7–2.4)

## 2015-12-14 LAB — CBC
HEMATOCRIT: 33.9 % — AB (ref 40.0–52.0)
HEMOGLOBIN: 12.1 g/dL — AB (ref 13.0–18.0)
MCH: 33 pg (ref 26.0–34.0)
MCHC: 35.8 g/dL (ref 32.0–36.0)
MCV: 92.2 fL (ref 80.0–100.0)
Platelets: 222 10*3/uL (ref 150–440)
RBC: 3.67 MIL/uL — AB (ref 4.40–5.90)
RDW: 12.6 % (ref 11.5–14.5)
WBC: 16.2 10*3/uL — AB (ref 3.8–10.6)

## 2015-12-14 MED ORDER — ALUM & MAG HYDROXIDE-SIMETH 200-200-20 MG/5ML PO SUSP
30.0000 mL | ORAL | Status: DC | PRN
  Administered 2015-12-14: 30 mL via ORAL
  Filled 2015-12-14 (×2): qty 30

## 2015-12-14 MED ORDER — ASPIRIN 81 MG PO TBEC: 81.0000 mg | DELAYED_RELEASE_TABLET | Freq: Every day | ORAL | Status: DC

## 2015-12-14 MED ORDER — OXYCODONE HCL 5 MG PO TABS: ORAL_TABLET | ORAL | 0 refills | Status: DC

## 2015-12-14 MED ORDER — APIXABAN 5 MG PO TABS: 5.0000 mg | ORAL_TABLET | Freq: Two times a day (BID) | ORAL | 5 refills | Status: DC

## 2015-12-14 NOTE — Discharge Instructions (Signed)
You may remove your dressing tomorrow and shower. No driving while on pain medication.

## 2015-12-14 NOTE — Progress Notes (Signed)
[facility]  Daily Progress Note   Subjective: 1 Day Post-Op:  Ultrasound guidance for vascular access left femoral artery, Catheter placement into right anterior tibial artery from left femoral approach, Aortogram and selective right lower extremity angiogram, Percutaneous transluminal angioplasty of right anterior tibial artery with 2.5 and 3 mm diameter angioplasty balloons, Catheter directed thrombolysis with 8 mg of TPA delivered with the AngioJet Omni catheter and a power pulse spray fashion to the right SFA and popliteal arteries, Mechanical rheolytic thrombectomy with the AngioJet Omni catheter to the right SFA, popliteal, and anterior tibial arteries, Percutaneous transluminal angioplasty of the popliteal artery with 4 mm diameter Lutonix drug-coated angioplasty balloon, Percutaneous transluminal angioplasty of the proximal SFA with 6 mm diameter Lutonix drug-coated angioplasty balloon, Viabahn covered stent placement to right popliteal artery for residual stenosis and thrombosis after above procedures using a 5 mm diameter x 10 cm length stent, Self-expanding stent placement to the right proximal superficial femoral artery for greater than 50% residual stenosis and dissection after angioplasty using a 6 mm diameter by 6 cm length life stent with StarClose closure device left femoral artery  Patient with two bouts of nausea of vomiting last night. Some right lower extremity discomfort last night into this AM.   Objective: Vitals:   12/13/15 1633 12/13/15 1900 12/13/15 2028 12/14/15 0414  BP: (!) 158/74 (!) 147/61 (!) 152/74 (!) 143/67  Pulse: 87 84 90 73  Resp: 16 17 16 18   Temp: 98.2 F (36.8 C) 98 F (36.7 C) 98.1 F (36.7 C) 98.1 F (36.7 C)  TempSrc: Oral Oral Oral Oral  SpO2: 100% 98% 98% 95%  Weight:    66.2 kg (146 lb)  Height:        Intake/Output Summary (Last 24 hours) at 12/14/15 0801 Last data filed at 12/14/15 1027  Gross per 24 hour  Intake              2266 ml  Output             1600 ml  Net              666 ml    Physical Exam: A&Ox3, NAD CV: RRR Pulmonary: CTA Bilaterally Abdomen: Soft, Nontender, Nondistended Vascular:  Right Lower Extremity: Faint PT pulses but leg warm down to toes.    Laboratory: CBC    Component Value Date/Time   WBC 16.2 (H) 12/14/2015 0451   HGB 12.1 (L) 12/14/2015 0451   HCT 33.9 (L) 12/14/2015 0451   PLT 222 12/14/2015 0451    BMET    Component Value Date/Time   NA 136 12/14/2015 0451   K 3.4 (L) 12/14/2015 0451   CL 99 (L) 12/14/2015 0451   CO2 31 12/14/2015 0451   GLUCOSE 137 (H) 12/14/2015 0451   BUN 9 12/14/2015 0451   CREATININE 0.62 12/14/2015 0451   CALCIUM 9.0 12/14/2015 0451   GFRNONAA >60 12/14/2015 0451   GFRAA >60 12/14/2015 0451    Assessment/Planning: 60 year old male s/p right lower extremity angiogram with aggrestat overnight - stable 1) Right extremity warm down to toes 2) OK to discharge home  [facility] PA-C 12/14/2015 8:01 AM

## 2015-12-14 NOTE — Discharge Summary (Signed)
[facility]    Discharge Summary    Patient ID:  Travis Salas MRN: [id number] DOB/AGE: 09-08-1955 60 y.o.  Admit date: 12/13/2015 Discharge date: 12/14/2015 Date of Surgery: 12/13/2015 Surgeon: Surgeon(s): Annice Needy, MD  Admission Diagnosis: RT leg angio  ASO w rest pain     Anesthesia Needed   Per Vernona Rieger called Juliette Alcide and Anesthesia will be available at 10:15A  Discharge Diagnoses:  RT leg angio  ASO w rest pain     Anesthesia Needed   Per Vernona Rieger called Juliette Alcide and Anesthesia will be available at 10:15A  Secondary Diagnoses: Past Medical History:  Diagnosis Date  . Hyperlipidemia   . Hypertension   . Peripheral vascular disease (HCC)     Procedure(s): Lower Extremity Angiography  Discharged Condition: good  HPI:  Patient is a 60 y.o.male with severe peripheral vascular disease status post right lower extremity revascularization earlier this year. He presented with worsening pain now with ischemic rest pain in the right leg. The patient has noninvasive study showing a markedly reduced ABI 0.3 range on the right in a stable but severely reduced ABI at 0.5 on the left. This would be consistent with occlusion of his previous intervention on the right.  On 12/13/15, the patient underwent a Ultrasound guidance for vascular access left femoral artery, 2. Catheter placement into right anterior tibial artery from left femoral approach, 3. Aortogram and selective right lower extremity angiogram, 4. Percutaneous transluminal angioplasty of right anterior tibial artery with 2.5 and 3 mm diameter angioplasty balloons, 5. Catheter directed thrombolysis with 8 mg of TPA delivered with the AngioJet Omni catheter and a power pulse spray fashion to the right SFA and popliteal arteries, 6.  Mechanical rheolytic thrombectomy with the AngioJet Omni catheter to the right SFA, popliteal, and anterior tibial arteries, 7.  Percutaneous transluminal angioplasty of the  popliteal artery with 4 mm diameter Lutonix drug-coated angioplasty balloon, 8.  Percutaneous transluminal angioplasty of the proximal SFA with 6 mm diameter Lutonix drug-coated angioplasty balloon, 9.  Viabahn covered stent placement to right popliteal artery for residual stenosis and thrombosis after above procedures using a 5 mm diameter x 10 cm length stent, 10. Self-expanding stent placement to the right proximal superficial femoral artery for greater than 50% residual stenosis and dissection after angioplasty using a 6 mm diameter by 6 cm length life stent with StarClose closure device left femoral artery  Patient was started on Aggrastat s/p intervention which ran until the following morning.   Hospital Course:  Travis Salas is a 60 y.o. male is S/P Right  Procedure(s): Lower Extremity Angiography  Physical exam:  A&Ox3, NAD CV: RRR Pulmonary: CTA Bilaterally Abdomen: Soft, Nontender, Nondistended Left Groin: OR dressing intact, clean and dry Vascular:     Right Lower Extremity: Faint PT pulses but leg warm down to toes.   Post-op wounds clean, dry, intact or healing well  Pt. Ambulating, voiding and taking PO diet without difficulty.  Pt pain controlled with PO pain meds.  Labs as below  Complications:none  Consults:  None  Significant Diagnostic Studies: CBC Lab Results  Component Value Date   WBC 16.2 (H) 12/14/2015   HGB 12.1 (L) 12/14/2015   HCT 33.9 (L) 12/14/2015   MCV 92.2 12/14/2015   PLT 222 12/14/2015    BMET    Component Value Date/Time   NA 136 12/14/2015 0451   K 3.4 (L) 12/14/2015 0451   CL 99 (L) 12/14/2015 0451   CO2  31 12/14/2015 0451   GLUCOSE 137 (H) 12/14/2015 0451   BUN 9 12/14/2015 0451   CREATININE 0.62 12/14/2015 0451   CALCIUM 9.0 12/14/2015 0451   GFRNONAA >60 12/14/2015 0451   GFRAA >60 12/14/2015 0451   COAG No results found for: INR, PROTIME   Disposition:  Discharge to :Home    Medication List    STOP taking  these medications   clopidogrel 75 MG tablet Commonly known as:  PLAVIX   HYDROcodone-acetaminophen 5-325 MG tablet Commonly known as:  NORCO   ibuprofen 200 MG tablet Commonly known as:  ADVIL,MOTRIN     TAKE these medications   amLODipine-benazepril 5-20 MG capsule Commonly known as:  LOTREL Take 1 capsule by mouth 2 (two) times daily.   apixaban 5 MG Tabs tablet Commonly known as:  ELIQUIS Take 1 tablet (5 mg total) by mouth 2 (two) times daily.   aspirin 81 MG EC tablet Take 1 tablet (81 mg total) by mouth daily.   Fish Oil 1200 MG Caps Take 2,400 mg by mouth 2 (two) times daily.   multivitamin with minerals Tabs tablet Take 1 tablet by mouth every evening.   ONE-A-DAY PROSTATE PO Take 1 tablet by mouth daily. "Super Beta Prostate"   oxyCODONE 5 MG immediate release tablet Commonly known as:  Oxy IR/ROXICODONE One to Two Tabs by Mouth Every Four to Six Hours As Needed For Pain   rosuvastatin 20 MG tablet Commonly known as:  CRESTOR Take 20 mg by mouth daily.      Verbal and written Discharge instructions given to the patient. Wound care per Discharge AVS Follow-up Information    DEW,JASON, MD. Go on 12/28/2015.   Specialties:  Vascular Surgery, Radiology, Interventional Cardiology Why:  With ABI's at 2:45 PM Contact information: [address] [place] [address] [phone]           Signed: Tonette Lederer, PA-C  12/14/2015, 2:03 PM

## 2015-12-14 NOTE — Progress Notes (Signed)
Pt A and O x 4. VSS. Pt tolerating diet well. No complaints of pain or nausea. IV removed intact, prescriptions given. Pt voiced understanding of discharge instructions with no further questions. Pt discharged via wheelchair with axillary.   

## 2015-12-14 NOTE — Anesthesia Postprocedure Evaluation (Addendum)
Anesthesia Post Note  Patient: Travis Salas  Procedure(s) Performed: Procedure(s) (LRB): Lower Extremity Angiography (Right)  Patient location during evaluation: PACU Anesthesia Type: General Level of consciousness: awake and alert Pain management: pain level controlled Vital Signs Assessment: post-procedure vital signs reviewed and stable Respiratory status: spontaneous breathing, nonlabored ventilation and respiratory function stable Cardiovascular status: blood pressure returned to baseline and stable Postop Assessment: no signs of nausea or vomiting Anesthetic complications: no    Last Vitals:  Vitals:   12/13/15 2028 12/14/15 0414  BP: (!) 152/74 (!) 143/67  Pulse: 90 73  Resp: 16 18  Temp: 36.7 C 36.7 C    Last Pain:  Vitals:   12/14/15 0440  TempSrc:   PainSc: 3                  Ramata Strothman

## 2015-12-14 NOTE — Progress Notes (Signed)
Earlier this morning, the patient requested to stand up for it helps to relieve the pain in right foot. Patient sat down on the side of the bed after standing about 1-2 minutes and begin to feel nauseous. Patient vomited x1 with Tea-colored emesis noted about 200cc. Zofran given per patient request and also morphine given for pain in right foot.  Notified Dr. Gilda Crease about the Tea-colored emesis. Orders given to continue IV infusion Aggrastat and to make patient NPO. Carried out instructions as ordered. Will continue to monitor patient to end of shift as well as notify oncoming nurse.

## 2015-12-21 ENCOUNTER — Inpatient Hospital Stay
Admission: AD | Admit: 2015-12-21 | Discharge: 2015-12-29 | DRG: 271 | Disposition: A | Discharge status: Home Health | Payer: BLUE CROSS/BLUE SHIELD | Source: Ambulatory Visit | Attending: Vascular Surgery | Admitting: Vascular Surgery

## 2015-12-21 ENCOUNTER — Inpatient Hospital Stay: Payer: BLUE CROSS/BLUE SHIELD

## 2015-12-21 DIAGNOSIS — I70221 Atherosclerosis of native arteries of extremities with rest pain, right leg: Secondary | ICD-10-CM | POA: Diagnosis present

## 2015-12-21 DIAGNOSIS — I10 Essential (primary) hypertension: Secondary | ICD-10-CM | POA: Diagnosis present

## 2015-12-21 DIAGNOSIS — Z87891 Personal history of nicotine dependence: Secondary | ICD-10-CM

## 2015-12-21 DIAGNOSIS — I998 Other disorder of circulatory system: Secondary | ICD-10-CM | POA: Diagnosis present

## 2015-12-21 DIAGNOSIS — D62 Acute posthemorrhagic anemia: Secondary | ICD-10-CM | POA: Diagnosis not present

## 2015-12-21 DIAGNOSIS — E785 Hyperlipidemia, unspecified: Secondary | ICD-10-CM | POA: Diagnosis present

## 2015-12-21 HISTORY — DX: Systemic involvement of connective tissue, unspecified: M35.9

## 2015-12-21 LAB — BASIC METABOLIC PANEL
Anion gap: 11 (ref 5–15)
BUN: 13 mg/dL (ref 6–20)
CHLORIDE: 98 mmol/L — AB (ref 101–111)
CO2: 23 mmol/L (ref 22–32)
CREATININE: 0.86 mg/dL (ref 0.61–1.24)
Calcium: 9.5 mg/dL (ref 8.9–10.3)
GFR calc Af Amer: >60 mL/min (ref >60)
GFR calc non Af Amer: >60 mL/min (ref >60)
GLUCOSE: 128 mg/dL — AB (ref 65–99)
Potassium: 3.9 mmol/L (ref 3.5–5.1)
Sodium: 132 mmol/L — ABNORMAL LOW (ref 135–145)

## 2015-12-21 LAB — CBC WITH DIFFERENTIAL/PLATELET
Basophils Absolute: 0.1 10*3/uL (ref 0–0.1)
Basophils Relative: 1 %
EOS ABS: 0.1 10*3/uL (ref 0–0.7)
EOS PCT: 1 %
HCT: 32.1 % — ABNORMAL LOW (ref 40.0–52.0)
HEMOGLOBIN: 11.4 g/dL — AB (ref 13.0–18.0)
LYMPHS ABS: 1.5 10*3/uL (ref 1.0–3.6)
Lymphocytes Relative: 11 %
MCH: 32.2 pg (ref 26.0–34.0)
MCHC: 35.5 g/dL (ref 32.0–36.0)
MCV: 90.8 fL (ref 80.0–100.0)
MONOS PCT: 10 %
Monocytes Absolute: 1.4 10*3/uL — ABNORMAL HIGH (ref 0.2–1.0)
Neutro Abs: 10 10*3/uL — ABNORMAL HIGH (ref 1.4–6.5)
Neutrophils Relative %: 77 %
PLATELETS: 443 10*3/uL — AB (ref 150–440)
RBC: 3.53 MIL/uL — ABNORMAL LOW (ref 4.40–5.90)
RDW: 12.8 % (ref 11.5–14.5)
WBC: 13 10*3/uL — ABNORMAL HIGH (ref 3.8–10.6)

## 2015-12-21 LAB — PROTIME-INR
INR: 1.24
Prothrombin Time: 15.7 seconds — ABNORMAL HIGH (ref 11.4–15.2)

## 2015-12-21 LAB — HEPARIN LEVEL (UNFRACTIONATED): HEPARIN UNFRACTIONATED: 1.38 [IU]/mL — AB (ref 0.30–0.70)

## 2015-12-21 LAB — APTT: aPTT: 37 seconds — ABNORMAL HIGH (ref 24–36)

## 2015-12-21 MED ORDER — IOPAMIDOL (ISOVUE-370) INJECTION 76%
125.0000 mL | Freq: Once | INTRAVENOUS | Status: AC | PRN
  Administered 2015-12-21: 125 mL via INTRAVENOUS

## 2015-12-21 MED ORDER — OXYCODONE HCL 5 MG PO TABS
5.0000 mg | ORAL_TABLET | ORAL | Status: DC | PRN
  Administered 2015-12-22 – 2015-12-29 (×6): 5 mg via ORAL
  Filled 2015-12-21 (×8): qty 1

## 2015-12-21 MED ORDER — HEPARIN (PORCINE) IN NACL 100-0.45 UNIT/ML-% IJ SOLN
1350.0000 [IU]/h | INTRAMUSCULAR | Status: DC
  Administered 2015-12-21: 1100 [IU]/h via INTRAVENOUS
  Administered 2015-12-22: 1350 [IU]/h via INTRAVENOUS
  Filled 2015-12-21 (×3): qty 250

## 2015-12-21 MED ORDER — FLEET ENEMA 7-19 GM/118ML RE ENEM: 1.0000 | ENEMA | Freq: Once | RECTAL | Status: DC | PRN

## 2015-12-21 MED ORDER — ASPIRIN EC 81 MG PO TBEC
81.0000 mg | DELAYED_RELEASE_TABLET | Freq: Every day | ORAL | Status: DC
  Administered 2015-12-21 – 2015-12-29 (×7): 81 mg via ORAL
  Filled 2015-12-21 (×7): qty 1

## 2015-12-21 MED ORDER — SORBITOL 70 % SOLN
30.0000 mL | Freq: Every day | Status: DC | PRN
  Filled 2015-12-21: qty 30

## 2015-12-21 MED ORDER — HEPARIN BOLUS VIA INFUSION
2000.0000 [IU] | Freq: Once | INTRAVENOUS | Status: AC
  Administered 2015-12-21: 2000 [IU] via INTRAVENOUS
  Filled 2015-12-21: qty 2000

## 2015-12-21 MED ORDER — ACETAMINOPHEN 650 MG RE SUPP: 650.0000 mg | Freq: Four times a day (QID) | RECTAL | Status: DC | PRN

## 2015-12-21 MED ORDER — SODIUM CHLORIDE 0.9% FLUSH
3.0000 mL | Freq: Two times a day (BID) | INTRAVENOUS | Status: DC
  Administered 2015-12-21 – 2015-12-28 (×7): 3 mL via INTRAVENOUS

## 2015-12-21 MED ORDER — MAGNESIUM HYDROXIDE 400 MG/5ML PO SUSP
30.0000 mL | Freq: Every day | ORAL | Status: DC | PRN
  Administered 2015-12-26: 30 mL via ORAL
  Filled 2015-12-21: qty 30

## 2015-12-21 MED ORDER — DEXTROSE-NACL 5-0.9 % IV SOLN
INTRAVENOUS | Status: DC
Start: 2015-12-21 — End: 2015-12-24
  Administered 2015-12-21 – 2015-12-23 (×4): via INTRAVENOUS

## 2015-12-21 MED ORDER — SODIUM CHLORIDE 0.9 % IV SOLN
4.0000 mg | Freq: Four times a day (QID) | INTRAVENOUS | Status: DC | PRN
  Filled 2015-12-21: qty 2

## 2015-12-21 MED ORDER — HYDROMORPHONE HCL 1 MG/ML IJ SOLN
1.0000 mg | INTRAMUSCULAR | Status: DC | PRN
  Administered 2015-12-21 – 2015-12-23 (×9): 1 mg via INTRAVENOUS
  Filled 2015-12-21 (×11): qty 1

## 2015-12-21 MED ORDER — ONDANSETRON HCL 4 MG PO TABS: 4.0000 mg | ORAL_TABLET | Freq: Four times a day (QID) | ORAL | Status: DC | PRN

## 2015-12-21 MED ORDER — ACETAMINOPHEN 325 MG PO TABS: 650.0000 mg | ORAL_TABLET | Freq: Four times a day (QID) | ORAL | Status: DC | PRN

## 2015-12-21 MED ORDER — DOCUSATE SODIUM 100 MG PO CAPS
100.0000 mg | ORAL_CAPSULE | Freq: Two times a day (BID) | ORAL | Status: DC
  Administered 2015-12-21 – 2015-12-29 (×15): 100 mg via ORAL
  Filled 2015-12-21 (×8): qty 1
  Filled 2015-12-21: qty 2
  Filled 2015-12-21 (×7): qty 1
  Filled 2015-12-21: qty 2

## 2015-12-21 NOTE — Consult Note (Addendum)
ANTICOAGULATION CONSULT NOTE - Initial Consult  Pharmacy Consult for heparin drip Indication: arterial insufficiency with rest pain  Allergies  Allergen Reactions  . Hydrocodone Swelling    Patient Measurements:   Heparin Dosing Weight: 66.2kg  Vital Signs: Temp: 98.2 F (36.8 C) (08/08 1602) Temp Source: Oral (08/08 1602) BP: 135/79 (08/08 1602) Pulse Rate: 91 (08/08 1602)  Labs: No results for input(s): HGB, HCT, PLT, APTT, LABPROT, INR, HEPARINUNFRC, HEPRLOWMOCWT, CREATININE, CKTOTAL, CKMB, TROPONINI in the last 72 hours.  Estimated Creatinine Clearance: 93.1 mL/min (by C-G formula based on SCr of 0.8 mg/dL).   Medical History: Past Medical History:  Diagnosis Date  . Hyperlipidemia   . Hypertension   . Peripheral vascular disease (HCC)     Medications:  Scheduled:  . aspirin EC  81 mg Oral Daily  . docusate sodium  100 mg Oral BID  . heparin  2,000 Units Intravenous Once  . sodium chloride flush  3 mL Intravenous Q12H    Assessment: Pt is a 60 year old male who is a direct admit by Dr. Wyn Quakerew for arterial insufficiency with rest pain. Pharmacy is consulted to dose a heparin drip. Pt is on apixaban at home. Last dose was yesterday according to RN. Ok to start heparin drip now as it has been >12 hours since last apixaban dose. Will get a baseline HL, APTT, INR and CBC. Will most likely need to dose based off of APTT until HL and APTT correlate if baseline HL is >0.1. Follow up on baseline HL. Due to recent anticoaulation use, will give a smaller bolus than normal.  Goal of Therapy:  Heparin level 0.3-0.7 units/ml Monitor platelets by anticoagulation protocol: Yes   Plan:  Give 2000 units bolus x 1 Start heparin infusion at 1100 units/hr Check anti-Xa level in 6 hours and daily while on heparin Continue to monitor H&H and platelets   8/9 AM aPTT 55. Increase rate to 1200 units/hr. Recheck aPTT and heparin level in 6 hours.  Olene FlossMelissa D Maccia, Pharm.D Clinical  Pharmacist  12/21/2015,5:22 PM

## 2015-12-21 NOTE — H&P (Signed)
See scanned office note for H&P

## 2015-12-22 LAB — CBC
HEMATOCRIT: 29.8 % — AB (ref 40.0–52.0)
HEMOGLOBIN: 10.8 g/dL — AB (ref 13.0–18.0)
MCH: 32.9 pg (ref 26.0–34.0)
MCHC: 36.1 g/dL — ABNORMAL HIGH (ref 32.0–36.0)
MCV: 91.2 fL (ref 80.0–100.0)
PLATELETS: 422 10*3/uL (ref 150–440)
RBC: 3.27 MIL/uL — ABNORMAL LOW (ref 4.40–5.90)
RDW: 13 % (ref 11.5–14.5)
WBC: 9.8 10*3/uL (ref 3.8–10.6)

## 2015-12-22 LAB — HEPARIN LEVEL (UNFRACTIONATED)
HEPARIN UNFRACTIONATED: 0.58 [IU]/mL (ref 0.30–0.70)
HEPARIN UNFRACTIONATED: 0.69 [IU]/mL (ref 0.30–0.70)

## 2015-12-22 LAB — APTT
aPTT: 55 seconds — ABNORMAL HIGH (ref 24–36)
aPTT: 51 seconds — ABNORMAL HIGH (ref 24–36)
aPTT: 68 seconds — ABNORMAL HIGH (ref 24–36)

## 2015-12-22 LAB — MRSA PCR SCREENING: MRSA by PCR: NEGATIVE

## 2015-12-22 MED ORDER — CHLORHEXIDINE GLUCONATE 4 % EX LIQD
60.0000 mL | Freq: Once | CUTANEOUS | Status: AC
  Administered 2015-12-23: 4 via TOPICAL

## 2015-12-22 NOTE — Consult Note (Addendum)
ANTICOAGULATION CONSULT NOTE - Initial Consult  Pharmacy Consult for heparin drip Indication: arterial insufficiency with rest pain  Allergies  Allergen Reactions  . Hydrocodone Swelling    Patient Measurements: Weight: 139 lb 4.8 oz (63.2 kg) Heparin Dosing Weight: 66.2kg  Vital Signs: Temp: 98.5 F (36.9 C) (08/09 1259) Temp Source: Oral (08/09 1259) BP: 110/66 (08/09 1259) Pulse Rate: 67 (08/09 1259)  Labs:  Recent Labs  12/21/15 1825 12/22/15 0406 12/22/15 1152  HGB 11.4* 10.8*  --   HCT 32.1* 29.8*  --   PLT 443* 422  --   APTT 37* 55* 51*  LABPROT 15.7*  --   --   INR 1.24  --   --   HEPARINUNFRC 1.38*  --  0.58  CREATININE 0.86  --   --     Estimated Creatinine Clearance: 82.7 mL/min (by C-G formula based on SCr of 0.86 mg/dL).   Medical History: Past Medical History:  Diagnosis Date  . Collagen vascular disease (HCC)   . Hyperlipidemia   . Hypertension   . Peripheral vascular disease (HCC)     Medications:  Scheduled:  . aspirin EC  81 mg Oral Daily  . docusate sodium  100 mg Oral BID  . sodium chloride flush  3 mL Intravenous Q12H    Assessment: Pt is a 60 year old male who is a direct admit by Dr. Wyn Quakerew for arterial insufficiency with rest pain. Pharmacy is consulted to dose a heparin drip. Pt is on apixaban at home. Last dose was yesterday according to RN. Ok to start heparin drip now as it has been >12 hours since last apixaban dose. Will get a baseline HL, APTT, INR and CBC. Will most likely need to dose based off of APTT until HL and APTT correlate if baseline HL is >0.1. Follow up on baseline HL. Due to recent anticoaulation use, will give a smaller bolus than normal.  Goal of Therapy:  Heparin level 0.3-0.7 units/ml Monitor platelets by anticoagulation protocol: Yes   Plan:  Give 2000 units bolus x 1 Start heparin infusion at 1100 units/hr Check anti-Xa level in 6 hours and daily while on heparin Continue to monitor H&H and platelets    8/9 AM aPTT 55. Increase rate to 1200 units/hr. Recheck aPTT and heparin level in 6 hours.  8/9 1247  APTT 51 and HL 0.58. Will increase rate to 1350 units/hr and recheck aPTT and Heparin level in 6 hours.   Cher NakaiSheema Govanni Plemons, PharmD Clinical Pharmacist 12/22/2015 1:31 PM

## 2015-12-22 NOTE — Progress Notes (Addendum)
ANTICOAGULATION CONSULT NOTE - Initial Consult  Pharmacy Consult for Heparin  Indication: limb ischemia  Allergies  Allergen Reactions  . Hydrocodone Swelling    Patient Measurements: Weight: 139 lb 4.8 oz (63.2 kg) Heparin Dosing Weight:   Vital Signs: Temp: 98.5 F (36.9 C) (08/09 1259) Temp Source: Oral (08/09 1259) BP: 110/66 (08/09 1259) Pulse Rate: 67 (08/09 1259)  Labs:  Recent Labs  12/21/15 1825 12/22/15 0406 12/22/15 1152 12/22/15 1936  HGB 11.4* 10.8*  --   --   HCT 32.1* 29.8*  --   --   PLT 443* 422  --   --   APTT 37* 55* 51* 68*  LABPROT 15.7*  --   --   --   INR 1.24  --   --   --   HEPARINUNFRC 1.38*  --  0.58 0.69  CREATININE 0.86  --   --   --     Estimated Creatinine Clearance: 82.7 mL/min (by C-G formula based on SCr of 0.86 mg/dL).   Medical History: Past Medical History:  Diagnosis Date  . Collagen vascular disease (HCC)   . Hyperlipidemia   . Hypertension   . Peripheral vascular disease (HCC)     Medications:  Scheduled:  . aspirin EC  81 mg Oral Daily  . docusate sodium  100 mg Oral BID  . sodium chloride flush  3 mL Intravenous Q12H    Assessment: Pt is a 60 year old male who is a direct admit by Dr. Wyn Quakerew for arterial insufficiency with rest pain. Pharmacy is consulted to dose a heparin drip. Pt is on apixaban at home. Last dose was yesterday according to RN. Ok to start heparin drip now as it has been >12 hours since last apixaban dose. Will get a baseline HL, APTT, INR and CBC. Will most likely need to dose based off of APTT until HL and APTT correlate if baseline HL is >0.1. Follow up on baseline HL. Due to recent anticoaulation use, will give a smaller bolus than normal.  8/09:  HL @ 19:36 = 0.69 ,  APTT = 68   Goal of Therapy:  Heparin level 0.3-0.7 units/ml Monitor platelets by anticoagulation protocol: Yes   Plan:  Since this pt's aptt and HL are now therapeutic will now use HL to guide heparin dosing.  Will  continue this pt on current heparin rate and recheck HL on 8/10 with AM labs.   08/10 heparin level 0.49. Continue current regimen, Recheck CBC and heparin level with tomorrow AM labs.  Robbins,Jason D 12/22/2015,8:07 PM

## 2015-12-22 NOTE — Progress Notes (Signed)
[facility]  Daily Progress Note   Subjective  - * No surgery date entered *  Pain still pretty bad.  IV dilaudid does help.   CTA done and I have reviewed this.  There is a distal target for bypass present No other major events overnight  Objective Vitals:   12/21/15 1602 12/21/15 2106 12/22/15 0438 12/22/15 1259  BP: 135/79 113/66 125/68 110/66  Pulse: 91 89 70 67  Resp: 18 18 20 18   Temp: 98.2 F (36.8 C) 98.3 F (36.8 C)  98.5 F (36.9 C)  TempSrc: Oral Oral  Oral  SpO2: 100% 98% 98% 100%  Weight:   63.2 kg (139 lb 4.8 oz)     Intake/Output Summary (Last 24 hours) at 12/22/15 1357 Last data filed at 12/22/15 0910  Gross per 24 hour  Intake              223 ml  Output             1425 ml  Net            -1202 ml    PULM  CTAB CV  RRR VASC  Right foot cool with no pulses palpable, foot with petechial areas and some mottling present  Laboratory CBC    Component Value Date/Time   WBC 9.8 12/22/2015 0406   HGB 10.8 (L) 12/22/2015 0406   HCT 29.8 (L) 12/22/2015 0406   PLT 422 12/22/2015 0406    BMET    Component Value Date/Time   NA 132 (L) 12/21/2015 1825   K 3.9 12/21/2015 1825   CL 98 (L) 12/21/2015 1825   CO2 23 12/21/2015 1825   GLUCOSE 128 (H) 12/21/2015 1825   BUN 13 12/21/2015 1825   CREATININE 0.86 12/21/2015 1825   CALCIUM 9.5 12/21/2015 1825   GFRNONAA >60 12/21/2015 1825   GFRAA >60 12/21/2015 1825    Assessment/Planning: RLE ischemia with rest pain CTA reviewed and does have distal target for bypass Plan femoral to distal bypass tomorrow am Risks and benefits discussed Limb threatening situation       DEW,JASON  12/22/2015, 1:57 PM

## 2015-12-23 ENCOUNTER — Inpatient Hospital Stay: Payer: BLUE CROSS/BLUE SHIELD

## 2015-12-23 ENCOUNTER — Encounter
Admission: AD | Disposition: A | Discharge status: Home Health | Payer: Self-pay | Source: Ambulatory Visit | Attending: Vascular Surgery

## 2015-12-23 ENCOUNTER — Inpatient Hospital Stay: Payer: BLUE CROSS/BLUE SHIELD | Admitting: Anesthesiology

## 2015-12-23 HISTORY — PX: FEMORAL-TIBIAL BYPASS GRAFT: SHX938

## 2015-12-23 HISTORY — PX: ENDARTERECTOMY FEMORAL: SHX5804

## 2015-12-23 LAB — MRSA PCR SCREENING: MRSA by PCR: NEGATIVE

## 2015-12-23 LAB — CBC
HCT: 28.5 % — ABNORMAL LOW (ref 40.0–52.0)
HEMOGLOBIN: 10 g/dL — AB (ref 13.0–18.0)
MCH: 32.2 pg (ref 26.0–34.0)
MCHC: 34.9 g/dL (ref 32.0–36.0)
MCV: 92.2 fL (ref 80.0–100.0)
Platelets: 440 10*3/uL (ref 150–440)
RBC: 3.09 MIL/uL — ABNORMAL LOW (ref 4.40–5.90)
RDW: 12.9 % (ref 11.5–14.5)
WBC: 8.1 10*3/uL (ref 3.8–10.6)

## 2015-12-23 LAB — ABO/RH: ABO/RH(D): B POS

## 2015-12-23 LAB — HEPARIN LEVEL (UNFRACTIONATED): Heparin Unfractionated: 0.49 IU/mL (ref 0.30–0.70)

## 2015-12-23 LAB — GLUCOSE, CAPILLARY: GLUCOSE-CAPILLARY: 187 mg/dL — AB (ref 65–99)

## 2015-12-23 SURGERY — CREATION, BYPASS, ARTERIAL, FEMORAL TO TIBIAL, USING GRAFT
Anesthesia: General | Laterality: Right

## 2015-12-23 SURGERY — CREATION, BYPASS, ARTERIAL, FEMORAL TO TIBIAL, USING GRAFT
Anesthesia: General | Laterality: Right | Wound class: Clean

## 2015-12-23 MED ORDER — OMEGA-3-ACID ETHYL ESTERS 1 G PO CAPS
1.0000 g | ORAL_CAPSULE | Freq: Every day | ORAL | Status: DC
  Administered 2015-12-24 – 2015-12-29 (×6): 1 g via ORAL
  Filled 2015-12-23 (×6): qty 1

## 2015-12-23 MED ORDER — FENTANYL CITRATE (PF) 100 MCG/2ML IJ SOLN
INTRAMUSCULAR | Status: AC
  Filled 2015-12-23: qty 2

## 2015-12-23 MED ORDER — HEPARIN SODIUM (PORCINE) 5000 UNIT/ML IJ SOLN
INTRAMUSCULAR | Status: AC
  Filled 2015-12-23: qty 1

## 2015-12-23 MED ORDER — LIDOCAINE HCL (CARDIAC) 20 MG/ML IV SOLN
INTRAVENOUS | Status: DC | PRN
  Administered 2015-12-23: 40 mg via INTRAVENOUS

## 2015-12-23 MED ORDER — SODIUM CHLORIDE 0.9 % IV SOLN
INTRAVENOUS | Status: DC | PRN
  Administered 2015-12-23 (×3): via INTRAVENOUS

## 2015-12-23 MED ORDER — DEXAMETHASONE SODIUM PHOSPHATE 10 MG/ML IJ SOLN
INTRAMUSCULAR | Status: DC | PRN
  Administered 2015-12-23: 8 mg via INTRAVENOUS

## 2015-12-23 MED ORDER — HYDROMORPHONE HCL 1 MG/ML IJ SOLN
2.0000 mg | INTRAMUSCULAR | Status: DC | PRN
  Administered 2015-12-23 – 2015-12-24 (×7): 2 mg via INTRAVENOUS
  Filled 2015-12-23 (×7): qty 2

## 2015-12-23 MED ORDER — PROMETHAZINE HCL 25 MG/ML IJ SOLN
6.2500 mg | INTRAMUSCULAR | Status: DC | PRN
Start: 2015-12-23 — End: 2015-12-23

## 2015-12-23 MED ORDER — PHENYLEPHRINE HCL 10 MG/ML IJ SOLN
INTRAMUSCULAR | Status: DC | PRN
  Administered 2015-12-23 (×2): 50 ug via INTRAVENOUS
  Administered 2015-12-23: 100 ug via INTRAVENOUS
  Administered 2015-12-23 (×2): 50 ug via INTRAVENOUS

## 2015-12-23 MED ORDER — MIDAZOLAM HCL 2 MG/2ML IJ SOLN
INTRAMUSCULAR | Status: DC | PRN
  Administered 2015-12-23: .5 mg via INTRAVENOUS
  Administered 2015-12-23: 1.5 mg via INTRAVENOUS

## 2015-12-23 MED ORDER — FAMOTIDINE IN NACL 20-0.9 MG/50ML-% IV SOLN
20.0000 mg | Freq: Two times a day (BID) | INTRAVENOUS | Status: DC
  Administered 2015-12-23 – 2015-12-24 (×3): 20 mg via INTRAVENOUS
  Filled 2015-12-23 (×5): qty 50

## 2015-12-23 MED ORDER — DOPAMINE-DEXTROSE 3.2-5 MG/ML-% IV SOLN: 3.0000 ug/kg/min | INTRAVENOUS | Status: DC

## 2015-12-23 MED ORDER — MAGNESIUM SULFATE 2 GM/50ML IV SOLN
2.0000 g | Freq: Every day | INTRAVENOUS | Status: DC | PRN
Start: 2015-12-23 — End: 2015-12-29
  Filled 2015-12-23: qty 50

## 2015-12-23 MED ORDER — DEXTROSE 5 % IV SOLN
1.5000 g | Freq: Two times a day (BID) | INTRAVENOUS | Status: AC
  Administered 2015-12-23 – 2015-12-24 (×2): 1.5 g via INTRAVENOUS
  Filled 2015-12-23 (×2): qty 1.5

## 2015-12-23 MED ORDER — PROPOFOL 10 MG/ML IV BOLUS
INTRAVENOUS | Status: DC | PRN
  Administered 2015-12-23: 30 mg via INTRAVENOUS
  Administered 2015-12-23: 40 mg via INTRAVENOUS
  Administered 2015-12-23: 20 mg via INTRAVENOUS
  Administered 2015-12-23: 40 mg via INTRAVENOUS
  Administered 2015-12-23: 150 mg via INTRAVENOUS

## 2015-12-23 MED ORDER — METOPROLOL TARTRATE 5 MG/5ML IV SOLN: 2.0000 mg | INTRAVENOUS | Status: DC | PRN

## 2015-12-23 MED ORDER — EVICEL 5 ML EX KIT
PACK | CUTANEOUS | Status: DC | PRN
  Administered 2015-12-23: 5 mL via TOPICAL

## 2015-12-23 MED ORDER — FENTANYL CITRATE (PF) 100 MCG/2ML IJ SOLN
INTRAMUSCULAR | Status: DC | PRN
Start: 2015-12-23 — End: 2015-12-23
  Administered 2015-12-23 (×3): 25 ug via INTRAVENOUS
  Administered 2015-12-23: 50 ug via INTRAVENOUS
  Administered 2015-12-23 (×4): 25 ug via INTRAVENOUS
  Administered 2015-12-23: 50 ug via INTRAVENOUS
  Administered 2015-12-23: 25 ug via INTRAVENOUS
  Administered 2015-12-23: 50 ug via INTRAVENOUS
  Administered 2015-12-23: 25 ug via INTRAVENOUS
  Administered 2015-12-23: 100 ug via INTRAVENOUS
  Administered 2015-12-23: 25 ug via INTRAVENOUS
  Administered 2015-12-23: 50 ug via INTRAVENOUS

## 2015-12-23 MED ORDER — SENNOSIDES-DOCUSATE SODIUM 8.6-50 MG PO TABS: 1.0000 | ORAL_TABLET | Freq: Every evening | ORAL | Status: DC | PRN

## 2015-12-23 MED ORDER — GUAIFENESIN-DM 100-10 MG/5ML PO SYRP: 15.0000 mL | ORAL_SOLUTION | ORAL | Status: DC | PRN

## 2015-12-23 MED ORDER — ONDANSETRON HCL 4 MG/2ML IJ SOLN: 4.0000 mg | Freq: Four times a day (QID) | INTRAMUSCULAR | Status: DC | PRN

## 2015-12-23 MED ORDER — FENTANYL CITRATE (PF) 100 MCG/2ML IJ SOLN
25.0000 ug | INTRAMUSCULAR | Status: DC | PRN
  Administered 2015-12-23 (×2): 25 ug via INTRAVENOUS
  Administered 2015-12-23: 50 ug via INTRAVENOUS
  Administered 2015-12-23 (×2): 25 ug via INTRAVENOUS

## 2015-12-23 MED ORDER — MEPERIDINE HCL 25 MG/ML IJ SOLN: 6.2500 mg | INTRAMUSCULAR | Status: DC | PRN

## 2015-12-23 MED ORDER — ADULT MULTIVITAMIN W/MINERALS CH
1.0000 | ORAL_TABLET | Freq: Every evening | ORAL | Status: DC
  Administered 2015-12-24 – 2015-12-27 (×3): 1 via ORAL
  Filled 2015-12-23 (×3): qty 1

## 2015-12-23 MED ORDER — PHENOL 1.4 % MT LIQD
1.0000 | OROMUCOSAL | Status: DC | PRN
Start: 2015-12-23 — End: 2015-12-29
  Filled 2015-12-23: qty 177

## 2015-12-23 MED ORDER — ROSUVASTATIN CALCIUM 20 MG PO TABS
20.0000 mg | ORAL_TABLET | Freq: Every day | ORAL | Status: DC
  Administered 2015-12-24 – 2015-12-29 (×6): 20 mg via ORAL
  Filled 2015-12-23 (×6): qty 1

## 2015-12-23 MED ORDER — ACETAMINOPHEN 325 MG PO TABS: 325.0000 mg | ORAL_TABLET | ORAL | Status: DC | PRN

## 2015-12-23 MED ORDER — NITROGLYCERIN IN D5W 200-5 MCG/ML-% IV SOLN: 5.0000 ug/min | INTRAVENOUS | Status: DC

## 2015-12-23 MED ORDER — HEPARIN (PORCINE) IN NACL 100-0.45 UNIT/ML-% IJ SOLN
1500.0000 [IU]/h | INTRAMUSCULAR | Status: AC
  Administered 2015-12-23 – 2015-12-25 (×2): 1350 [IU]/h via INTRAVENOUS
  Administered 2015-12-26: 1500 [IU]/h via INTRAVENOUS
  Administered 2015-12-26: 1350 [IU]/h via INTRAVENOUS
  Administered 2015-12-27 – 2015-12-28 (×2): 1500 [IU]/h via INTRAVENOUS
  Filled 2015-12-23 (×11): qty 250

## 2015-12-23 MED ORDER — POTASSIUM CHLORIDE CRYS ER 20 MEQ PO TBCR: 20.0000 meq | EXTENDED_RELEASE_TABLET | Freq: Every day | ORAL | Status: DC | PRN

## 2015-12-23 MED ORDER — CEFAZOLIN SODIUM 1 G IJ SOLR
INTRAMUSCULAR | Status: DC | PRN
  Administered 2015-12-23: 2 g via INTRAMUSCULAR
  Administered 2015-12-23: 1 g via INTRAMUSCULAR

## 2015-12-23 MED ORDER — SODIUM CHLORIDE 0.9 % IV SOLN
INTRAVENOUS | Status: DC | PRN
  Administered 2015-12-23: 100 mL

## 2015-12-23 MED ORDER — LABETALOL HCL 5 MG/ML IV SOLN
10.0000 mg | INTRAVENOUS | Status: DC | PRN
  Filled 2015-12-23: qty 4

## 2015-12-23 MED ORDER — ACETAMINOPHEN 10 MG/ML IV SOLN
INTRAVENOUS | Status: AC
  Filled 2015-12-23: qty 100

## 2015-12-23 MED ORDER — EVICEL 5 ML EX KIT
PACK | CUTANEOUS | Status: AC
  Filled 2015-12-23: qty 2

## 2015-12-23 MED ORDER — SODIUM CHLORIDE 0.9 % IV SOLN
INTRAVENOUS | Status: DC | PRN
  Administered 2015-12-23: 300 mL via INTRAMUSCULAR

## 2015-12-23 MED ORDER — HYDRALAZINE HCL 20 MG/ML IJ SOLN: 5.0000 mg | INTRAMUSCULAR | Status: DC | PRN

## 2015-12-23 MED ORDER — HEPARIN SODIUM (PORCINE) 1000 UNIT/ML IJ SOLN
INTRAMUSCULAR | Status: DC | PRN
  Administered 2015-12-23: 1000 [IU] via INTRAVENOUS
  Administered 2015-12-23: 4000 [IU] via INTRAVENOUS
  Administered 2015-12-23: 2000 [IU] via INTRAVENOUS
  Administered 2015-12-23: 2500 [IU] via INTRAVENOUS

## 2015-12-23 MED ORDER — EPHEDRINE SULFATE 50 MG/ML IJ SOLN
INTRAMUSCULAR | Status: DC | PRN
  Administered 2015-12-23: 10 mg via INTRAVENOUS

## 2015-12-23 MED ORDER — ALUM & MAG HYDROXIDE-SIMETH 200-200-20 MG/5ML PO SUSP: 15.0000 mL | ORAL | Status: DC | PRN

## 2015-12-23 MED ORDER — ONDANSETRON HCL 4 MG/2ML IJ SOLN
INTRAMUSCULAR | Status: DC | PRN
  Administered 2015-12-23: 4 mg via INTRAVENOUS

## 2015-12-23 MED ORDER — SODIUM CHLORIDE 0.9 % IV SOLN
INTRAVENOUS | Status: DC
Start: 2015-12-23 — End: 2015-12-27
  Administered 2015-12-23 – 2015-12-27 (×8): via INTRAVENOUS

## 2015-12-23 MED ORDER — SODIUM CHLORIDE 0.9 % IV SOLN: 500.0000 mL | Freq: Once | INTRAVENOUS | Status: DC | PRN

## 2015-12-23 MED ORDER — OXYCODONE HCL 5 MG PO TABS: 5.0000 mg | ORAL_TABLET | Freq: Once | ORAL | Status: DC | PRN

## 2015-12-23 MED ORDER — ACETAMINOPHEN 650 MG RE SUPP: 325.0000 mg | RECTAL | Status: DC | PRN

## 2015-12-23 MED ORDER — ACETAMINOPHEN 10 MG/ML IV SOLN
INTRAVENOUS | Status: DC | PRN
  Administered 2015-12-23: 1000 mg via INTRAVENOUS

## 2015-12-23 MED ORDER — CEFAZOLIN SODIUM 1 G IJ SOLR
INTRAMUSCULAR | Status: AC
  Filled 2015-12-23: qty 10

## 2015-12-23 MED ORDER — OXYCODONE HCL 5 MG/5ML PO SOLN: 5.0000 mg | Freq: Once | ORAL | Status: DC | PRN

## 2015-12-23 SURGICAL SUPPLY — 75 items
APPLIER CLIP 9.375 SM OPEN (CLIP) ×6
BAG COUNTER SPONGE EZ (MISCELLANEOUS) IMPLANT
BAG DECANTER FOR FLEXI CONT (MISCELLANEOUS) ×3 IMPLANT
BALLN DORADO 7X60X80 (BALLOONS) ×3
BALLOON DORADO 7X60X80 (BALLOONS) ×1 IMPLANT
BLADE SURG 15 STRL LF DISP TIS (BLADE) ×1 IMPLANT
BLADE SURG 15 STRL SS (BLADE) ×2
BLADE SURG SZ11 CARB STEEL (BLADE) ×3 IMPLANT
BOOT SUTURE AID YELLOW STND (SUTURE) ×3 IMPLANT
BRUSH SCRUB 4% CHG (MISCELLANEOUS) ×3 IMPLANT
CANISTER SUCT 1200ML W/VALVE (MISCELLANEOUS) ×3 IMPLANT
CATH TORCON 5FR 0.38 (CATHETERS) ×3 IMPLANT
CATH TRAY 16F METER LATEX (MISCELLANEOUS) ×3 IMPLANT
CLIP APPLIE 9.375 SM OPEN (CLIP) ×2 IMPLANT
COUNTER SPONGE BAG EZ (MISCELLANEOUS)
COVER LIGHT HANDLE STERIS (MISCELLANEOUS) ×3 IMPLANT
COVER PROBE FLX POLY STRL (MISCELLANEOUS) ×6 IMPLANT
DEVICE PRESTO INFLATION (MISCELLANEOUS) ×3 IMPLANT
DRAPE C-ARM XRAY 36X54 (DRAPES) ×3 IMPLANT
DRAPE INCISE IOBAN 66X45 STRL (DRAPES) ×6 IMPLANT
DRAPE UNIVERSAL PACK (DRAPES) ×3 IMPLANT
DRSG OPSITE POSTOP 4X8 (GAUZE/BANDAGES/DRESSINGS) ×9 IMPLANT
DURAPREP 26ML APPLICATOR (WOUND CARE) ×6 IMPLANT
ELECT CAUTERY BLADE 6.4 (BLADE) ×6 IMPLANT
ELECT REM PT RETURN 9FT ADLT (ELECTROSURGICAL) ×6
ELECTRODE REM PT RTRN 9FT ADLT (ELECTROSURGICAL) ×2 IMPLANT
GLIDEWIRE ADV .035X180CM (WIRE) ×3 IMPLANT
GLOVE BIO SURGEON STRL SZ7 (GLOVE) ×12 IMPLANT
GLOVE INDICATOR 7.5 STRL GRN (GLOVE) ×3 IMPLANT
GOWN STRL REUS W/ TWL LRG LVL3 (GOWN DISPOSABLE) ×2 IMPLANT
GOWN STRL REUS W/TWL LRG LVL3 (GOWN DISPOSABLE) ×4
GRAFT VASC DISTAFLO 6X80 (Graft) ×3 IMPLANT
HEMOSTAT SURGICEL 2X14 (HEMOSTASIS) ×3 IMPLANT
HEMOSTAT SURGICEL 2X3 (HEMOSTASIS) ×3 IMPLANT
IV NS 500ML (IV SOLUTION) ×2
IV NS 500ML BAXH (IV SOLUTION) ×1 IMPLANT
KIT RM TURNOVER STRD PROC AR (KITS) ×3 IMPLANT
LABEL OR SOLS (LABEL) IMPLANT
LIQUID BAND (GAUZE/BANDAGES/DRESSINGS) ×6 IMPLANT
LOOP RED MAXI  1X406MM (MISCELLANEOUS) ×6
LOOP VESSEL MAXI 1X406 RED (MISCELLANEOUS) ×3 IMPLANT
LOOP VESSEL MINI 0.8X406 BLUE (MISCELLANEOUS) ×6 IMPLANT
LOOPS BLUE MINI 0.8X406MM (MISCELLANEOUS) ×12
NS IRRIG 500ML POUR BTL (IV SOLUTION) ×3 IMPLANT
PACK ANGIOGRAPHY (CUSTOM PROCEDURE TRAY) ×3 IMPLANT
PACK BASIN MAJOR ARMC (MISCELLANEOUS) ×3 IMPLANT
PATCH CAROTID ECM VASC 1X10 (Prosthesis & Implant Heart) ×3 IMPLANT
PENCIL ELECTRO HAND CTR (MISCELLANEOUS) ×3 IMPLANT
SHEATH BRITE TIP 7FRX11 (SHEATH) ×3 IMPLANT
SPONGE LAP 18X18 5 PK (GAUZE/BANDAGES/DRESSINGS) ×3 IMPLANT
SPONGE XRAY 4X4 16PLY STRL (MISCELLANEOUS) IMPLANT
STAPLER SKIN PROX 35W (STAPLE) ×3 IMPLANT
STENT VIABAHN 8X50X120 (Permanent Stent) ×3 IMPLANT
SUT GTX CV-5 TTC13 DBL (SUTURE) ×3 IMPLANT
SUT GTX CV-6 30 (SUTURE) ×9 IMPLANT
SUT GTX CV-7 30 3/8 TAPER (SUTURE) ×3 IMPLANT
SUT MNCRL 4-0 (SUTURE) ×2
SUT MNCRL 4-0 27XMFL (SUTURE) ×1
SUT PROLENE 5 0 RB 1 DA (SUTURE) ×6 IMPLANT
SUT PROLENE 6 0 BV (SUTURE) ×48 IMPLANT
SUT PROLENE 6 0 C 1 30 (SUTURE) ×3 IMPLANT
SUT SILK 2 0 (SUTURE) ×2
SUT SILK 2-0 18XBRD TIE 12 (SUTURE) ×1 IMPLANT
SUT SILK 3 0 (SUTURE) ×4
SUT SILK 3-0 18XBRD TIE 12 (SUTURE) ×2 IMPLANT
SUT SILK 4 0 (SUTURE) ×2
SUT SILK 4-0 18XBRD TIE 12 (SUTURE) ×1 IMPLANT
SUT VIC AB 2-0 CT1 (SUTURE) ×9 IMPLANT
SUT VIC AB 3-0 SH 27 (SUTURE) ×4
SUT VIC AB 3-0 SH 27X BRD (SUTURE) ×2 IMPLANT
SUT VICRYL+ 3-0 36IN CT-1 (SUTURE) ×12 IMPLANT
SUTURE MNCRL 4-0 27XMF (SUTURE) ×1 IMPLANT
SYR 20CC LL (SYRINGE) ×3 IMPLANT
SYR BULB IRRIG 60ML STRL (SYRINGE) ×3 IMPLANT
WIRE G 018X200 V18 (WIRE) ×3 IMPLANT

## 2015-12-23 NOTE — Anesthesia Postprocedure Evaluation (Signed)
Anesthesia Post Note  Patient: Travis Salas  Procedure(s) Performed: Procedure(s) (LRB): BYPASS GRAFT FEMORAL-TIBIAL ARTERY, COMMON FEMORAL AND PROFUNDIS FEMORAL WITH PATCH ANGIOPLASTY, POPLITEAL ANTERIOR ARTERY BYPASS, ILIOFEMORAL ANGIOGRAM VIABHAM STENT 8 MM DIAMETER X 5 CM LENGTH (Right) COMMON, FEMORAL, SUPERFICIAL, AND PROFUNDA ENDARTERECTOMY (Right)  Patient location during evaluation: PACU Anesthesia Type: General Level of consciousness: awake and alert Pain management: pain level controlled Vital Signs Assessment: post-procedure vital signs reviewed and stable Respiratory status: spontaneous breathing, nonlabored ventilation and respiratory function stable Cardiovascular status: blood pressure returned to baseline and stable Postop Assessment: no signs of nausea or vomiting Anesthetic complications: no    Last Vitals:  Vitals:   12/23/15 1520 12/23/15 1530  BP: (!) 155/80 (!) 147/78  Pulse: 92 92  Resp: 12 13  Temp:      Last Pain:  Vitals:   12/23/15 1530  TempSrc:   PainSc: Asleep                 Marielis Samara

## 2015-12-23 NOTE — Transfer of Care (Signed)
Immediate Anesthesia Transfer of Care Note  Patient: Travis Salas  Procedure(s) Performed: Procedure(s): BYPASS GRAFT FEMORAL-TIBIAL ARTERY, COMMON FEMORAL AND PROFUNDIS FEMORAL WITH PATCH ANGIOPLASTY, POPLITEAL ANTERIOR ARTERY BYPASS, ILIOFEMORAL ANGIOGRAM VIABHAM STENT 8 MM DIAMETER X 5 CM LENGTH (Right) COMMON, FEMORAL, SUPERFICIAL, AND PROFUNDA ENDARTERECTOMY (Right)  Patient Location: PACU  Anesthesia Type:General  Level of Consciousness: awake  Airway & Oxygen Therapy: Patient Spontanous Breathing and Patient connected to face mask oxygen  Post-op Assessment: Report given to RN and Post -op Vital signs reviewed and stable  Post vital signs: Reviewed and stable  Last Vitals:  Vitals:   12/22/15 2200 12/23/15 0354  BP: 128/71 136/68  Pulse: 73 73  Resp: 16 17  Temp: 36.9 C 36.9 C    Last Pain:  Vitals:   12/23/15 0636  TempSrc:   PainSc: 8       Patients Stated Pain Goal: 0 (12/23/15 0636)  Complications: No apparent anesthesia complications

## 2015-12-23 NOTE — Op Note (Signed)
[facility]   OPERATIVE NOTE     PRE-OPERATIVE DIAGNOSIS: PAD with rest pain right lower extremity status post multiple failed previous percutaneous interventions  POST-OPERATIVE DIAGNOSIS: Same as above  PROCEDURE: 1.  Right common femoral artery and profunda femoris artery endarterectomy with patch angioplasty using cor-matrix patch 2.  Right popliteal artery and tibioperoneal trunk endarterectomy 3.  Right SFA endarterectomy 4.  Right femoral artery to TP trunk bypass with 6mm PTFE Distaflo graft 5.  Right popliteal artery (from the Distaflo graft) to anterior tibial artery bypass with reverse saphenous vein graft 6.  Catheter placement into the aorta from right femoral approach 7.  Right iliofemoral arteriogram 8.  Viabahn stent placement to the right external iliac artery with 8mm diameter by 5 cm length stent  SURGEON:  Dr. Wyn Quaker and Dr. Gilda Crease, co-surgeons  ANESTHESIA: general  ESTIMATED BLOOD LOSS: 400 cc  FINDING(S): Extensive atherosclerotic disease of the common femoral artery, profunda femoris artery, superficial femoral artery proximally, popliteal artery, and tibioperoneal trunk with inadequate saphenous vein for long bypass but a short segment of the proximal great saphenous vein was usable for a shorter distal bypass  SPECIMEN(S):  Right common femoral, profunda femoris, and superficial femoral artery plaque. Right popliteal artery and tibioperoneal trunk plaque  INDICATIONS:   Travis Salas is a 60 y.o. male who presents with ischemic rest pain of the right lower extremity with 2 failed previous revascularization attempts.  The patient required surgical bypass for revascularization.  Risks and benefits were discussed including but not limited to bleeding, infection, thrombosis, limb loss, injury to nearby structures, cardiopulmonary complications, and death.  DESCRIPTION: After full informed written consent was obtained, the patient was  brought back to the operating room and placed supine upon the operating table.  Prior to induction, the patient was given intravenous antibiotics.  After obtaining adequate anesthesia, the patient was prepped and draped in the standard fashion for a femoral to distal bypass operation.  Attention was turned to the right groin.  A longitudinal incision was made over the right common femoral artery.  Using blunt dissection and electrocautery, the artery was dissected out from the inguinal ligament down to the femoral bifurcation.  The superficial femoral artery, profunda femoral artery, and external iliac artery were dissected out and vessel loops applied.  Circumflex branches were also dissected and controlled with vessel loops.  This common femoral artery was found on exam to be calcified and quite disease as was the profunda femoris artery to its primary branches and the proximal superficial femoral artery. There was an occluded stent in the superficial femoral artery about 3-4 cm below the bifurcation.      At this point, attention was turned to the calf.  An longitudinal incision was made one finger-width posterior to the tibia.  Using blunt dissection and electrocautery, a plane was developed through the subcutaneous tissue and fascia down to the popliteal space.  The popliteal vein was dissected out and retracted medially and posteriorly.  The below-the-knee popliteal artery was dissected away from the popliteal vein.  This popliteal artery was found to be very diseased, and the dissection was carried down to the tibioperoneal trunk beyond the origin of the anterior tibial artery and down to the tibioperoneal trunk bifurcation. As both his posterior tibial artery and anterior tibial artery continues to the foot and he did not have adequate vein for bypass the entire length, we felt that a bypass to the popliteal artery/tibioperoneal trunk to try  to keep the posterior tibial artery continuous and then a jump  graft from this artery to the anterior tibial artery would be his best option for long-term durability.      At this point, the patient's right greater saphenous vein was not found to be adequate for bypass conduit for the entire bypass but would be harvested over the proximal 12-15 segments for the jump graft bypass.  We elected to use a Distaflo PTFE graft as a conduit for what would be the femoral to tibioperoneal trunk bypass.  This was brought onto the field and flushed with heparin.  At this point, I bluntly dissected a space between the femoral condyles adjacent to the below-the-knee popliteal vessels.  I bluntly passed the long metal tunneler between the femoral condyles in a subsartorial fashion to the groin incision.  The bullet on the tunneler was removed and then the conduit was sewn to the inner cannula with a 2-0 Silk.  I passed the conduit through the metal tunnel, taking care to maintain the orientation of the conduit.     At this point, the patient was given 4000 units of Heparin intravenously.  After waiting three minutes, the external iliac artery, superficial femoral artery and profunda femoral artery were clamped.  An incision was made in the common femoral artery and extended proximally and distally with a Potts scissor.  We started by making a long incision on the common femoral artery and carrying this down to the primary bifurcation of the profunda femoris artery for an extensive endarterectomy. This was going to be a much longer segment and could be patched for the bypass graft. We elected to lay this open and use a core matrix patch for an endarterectomy in this location to try to improve the profunda femoris artery separate from the bypass. The Therapist, nutritional was used to gain a nice plane and a nice feathered distal endpoint was gained at the profunda bifurcation. There was disease up to the clamp proximally, but an endarterectomy was performed to remove as much plaque as possible  at this location. The vessel was locally heparinized and we had avoided the superficial femoral artery on this instance. The medial distal common femoral artery and proximal superficial femoral artery would then be used for the bypass later. The core matrix patch was then brought onto the field and cut and beveled proximally and distally to match the arteriotomy. This was closed with a running 6-0 Prolene suture and was flushed and de-aired prior to release of control. Initially, there was a reasonably good pulse in the profunda femoris artery.  Attention was then turned to the popliteal exposure.   I reset the exposure of the popliteal space.  I verified the popliteal artery was appropriately marked.  I determine the target segment for the anastomosis which would be the distal popliteal artery and then the tibioperoneal trunk.  I applied tension to control the artery proximally and distally with vessel loops.  I made an incision with a 11-blade in the artery and extended it proximally and distally with a Potts scissor. The vessels far too diseased for anastomosis initially. We elected to perform an endarterectomy and the [facility] elevator was brought onto the field. The endarterectomy was continued in the popliteal artery up to a previously placed stent where it was transected with the stent in the intima removed. Distally we went down to the tibioperoneal trunk bifurcation and at this point, a nice feathered endpoint was created both the peroneal artery  and the posterior tibial artery with good backbleeding identified.  I pulled the conduit to appropriate tension and length, taking into account straightening out the leg.  I adjusted the length of the Distaflo PTFE conduit sharply.  I spatulated this conduit to meet the dimensions of the arteriotomy.  The conduit was sewn to the popliteal artery with a running CV-7 suture.  Prior to completing this anastomosis, all vessels were backbled. No thrombus was noted  from any vessels and backbleeding was good.  The bypass conduit was allowed to bleed in an antegrade fashion with excellent pulsatile flow.  The anastomosis was completed in the usual fashion. We then created an arteriotomy on the distal medial common femoral artery and extended this down to the superficial femoral artery. Large bulky plaque was identified and the Therapist, nutritionalreer elevator was used to remove a large chunk of plaque from the first 2-3 cm of the superficial femoral artery down to the previously placed stent. The vessel was locally heparinized and flushed and then prepared for anastomosis in this location. The proximal conduit was cut and bevelled to match the arteriotomy.  The conduit was sewn to the common femoral artery with a running CV-5 suture.  Prior to completing this anastomosis, all vessels were backbled.  No thrombus was noted from any vessels and backbleeding was good.  The anastomosis was completed in the usual fashion. To create the bypass to the anterior tibial artery, the saphenous vein was harvested in the thigh and all side branches were ligated. Was then marked for orientation and brought onto the field. A tunnel was created between the anterior tibial incision after dissecting out the anterior tibial artery in its midsegment in an area that was reasonably free of disease. We tunneled from this anterior tibial incision to the bypass in the popliteal space. About 4-5 cm proximal to the distal bypass anastomosis of the PTFE graft, a graftotomy was created in the location that would correspond to the popliteal artery. The vein was then cut and beveled to match the graftotomy and an anastomosis was created with a CV 6 suture in the usual fashion. We then turned our attention to the distal anastomosis to the anterior tibial artery. An arteriotomy is created with 11 blade and extended with Potts scissors and the vessel was found to be suitable for distal bypass anastomosis to the mid anterior  tibial artery. The vein was then cut and beveled to an appropriate length to match the arteriotomy. The anastomosis created with a 6-0 Prolene suture in the usual fashion and the vessel was flushed and de-aired prior to release of control. On flushing maneuvers, it was noted that her proximal and inflow pulses were not as good as they were previously. There appeared to be a plaque at the clamp site in the distal external iliac artery proximally. There was a clear transition from a good pulse prior to this and a diminished pulse beyond this. We then accessed the core matrix patch and a 7 French sheath was placed in the proximal profunda femoris artery. I then imaged the stenosis with an angiogram through the sheath and there was a large bulky plaque from a clamp site area that was about 90% stenotic and clearly flow-limiting. I then used a 0.018 wire to navigate through the stenosis and a Kumpe catheter up into the aorta and confirm intraluminal flow in the aorta and right common iliac artery. The 0.018 wire was replaced. I elected to place an 8 mm diameter by 5  cm length Viabahn stent in the right external iliac artery and postdilated this with a 7 mm balloon. Completion angiogram from both the sheath in the femoral location as well as a catheter in the common iliac artery showed this to be widely patent with less than 10% residual stenosis. This also resulted in a brisk pulse through the graft as well as in the profunda femoris artery beyond the sheath. The sheath was removed and a pursestring 6-0 Prolene suture was placed. The distal bypass anastomosis completed after flushing maneuvers showed good flow. She also be noted that several other small dose of heparin were given due to the length of the procedure.   At this point, all incisions were washed out and Surgicel and Evicel were placed into both incisions.    At this point, bleeding in both incisions were controlled with electrocautery and suture  ligature.  The 2 calf incisions were closed with interrupted deep sutures to reapproximate the deep muscles and a running stitch of 3-0 Vicryl in the subcutaneous tissue.  The skin was reapproximated with staples. . The upper thigh incision for vein harvest was then closed with a 2-0 Vicryl and a 3-0 Monocryl and the skin was reapproximated with staples. Attention was turned to the groin.  The groin was repaired with a double layer of 2-0 Vicryl immediately superficial to the bypass conduit.  The superficial subcutaneous tissue was reapproximated with two layers of 3-0 Vicryl.  The skin was reapproximated with a running subcuticular of 4-0 Monocryl.  The skin was cleaned, dried, and reinforced with Dermabond.   The patient was then awakened from anesthesia and taken to the recovery room in stable condition having tolerated the procedure well.  He had a palpable dorsalis pedis pulse at the conclusion of the procedure and his foot was pink and warm.   COMPLICATIONS: none  CONDITION: stable   Taunya Goral 12/23/2015 5:57 PM

## 2015-12-23 NOTE — Op Note (Signed)
[facility]    OPERATIVE NOTE   PROCEDURE: 1. Right superficial femoral artery to tibioperoneal trunk artery bypass with 6 mm distal flow PTFE graft 2. Right popliteal to anterior tibial bypass using reversed great saphenous vein harvested from the right thigh 3. Endarterectomy with core matrix patch angioplasty of the right common femoral artery and the right profunda femoris artery 4. Endarterectomy of the right popliteal artery 5. Endarterectomy of the right tibioperoneal trunk 6. Endarterectomy of the right superficial femoral artery 7. Open angioplasty and stent placement with a Viabahn covered stent right external iliac artery 8. 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 and Annice Needy, M.D. co-surgeons  ASSISTANT(S): None  ANESTHESIA: general  ESTIMATED BLOOD LOSS: 400 cc  FINDING(S): None   SPECIMEN(S):  Plaque from the right common femoral, profunda femoris, popliteal and tibioperoneal trunk as well as the superficial femoral artery to pathology for permanent section  INDICATIONS:   Travis Salas is a 60 y.o. male who presents with worsening ischemia of his right leg. He is now in 10 out of 10 continuous pain. When evaluated by Dr. Wyn Quaker in the office he was noted to have profound ischemia with flatline tracings of the right foot. He has undergone multiple attempts at revascularization with intervention and therefore he is now undergoing open repair.. The risk, benefits, and alternative for bypass operations were discussed with the patient.  The patient is aware the risks include but are not limited to: bleeding, infection, myocardial infarction, stroke, limb loss, nerve damage, need for additional procedures in the  future, wound complications, and inability to complete the bypass.  The patient is voices understanding of these risks and agreed to proceed.  DESCRIPTION: After informed consent was obtained, the patient was brought back to the operating room and placed in the supine position.  Prior to induction, the patient was given intravenous antibiotics.  After general anesthesia is induced, the patient was prepped and draped in the standard fashion for a femoral to popliteal bypass operation.  Appropriate timeout is called.  Attention was turned to the right groin.  A longitudinal incision was made over the right common femoral artery.  Using blunt dissection and electrocautery, the artery was dissected circumferentially from the inguinal ligament down to the femoral bifurcation.  The superficial femoral artery, profunda femoral artery, and distal external iliac artery are looped with Silastic vessel loops.  Circumflex branches were also dissected and controlled with vessel loops as needed. It should be noted that the profunda femoris was dissected well beyond its origin to the level of the fourth order branches and each branch was individually looped with a Silastic vessel loop also the distal external iliac artery for a distance of approximately 2 cm was exposed and dissected circumferentially.     Also working from the vertical groin incision the saphenofemoral junction was identified and the saphenous vein dissected for a distance of approximately 6-8 cm was skeletonized ligating branches with 0 silk ties. Subsequently an the case a second medial thigh incision was created over the saphenous vein and the dissection carried down to expose the vein. Again branches were ligated with silk. The dissection was carried to a level where the vein bifurcated and became too small to use as a complete conduit for femoral distal bypass. Given  this finding it was decided to perform a prosthetic bypass to the level and which he  reconstitutes which was the tibioperoneal trunk and then to jump from this reconstruction to the anterior tibial using a second bypass with the short segment of great saphenous vein that was available.  Medial incision was then made in the calf and the distal popliteal and tibioperoneal trunk were identified. The popliteal as well as the origin of the peroneal and the origin of the posterior tibial were dissected circumferentially and looped with Silastic vessel loops. A tunneler was then passed and a Bard distal flow graft was then pulled through the tunnel. The distal end was approximated to the tibial artery.  The anterior tibial artery was then exposed through a lateral calf incision small branches were ligated with 6-0 Prolene or controlled with surgical clips Silastic vessel loops were placed proximally and distally. The interosseous membrane was then opened using blunt dissection and a Kelly clamp passed from the medial calf incision through to the lateral Incision to ensure a pathway for the vein graft.  The patient was given 4000 units of Heparin intravenously and re-bolused 3 times during the procedure, and this is allowed to circulate for proximally 5 minutes.  The external iliac artery, superficial femoral artery, and profunda femoral artery were then clamped along with any circumflex branches.  Arteriotomy is then made in the common femoral artery with an 11 blade and extended with Potts scissors. Arteriotomy is extended into the profunda femoris. The common femoral and profunda femoris arteries were found to be severely atherosclerotic with a large amount of plaque present which created a subtotal occlusion of both of these arteries..    Endarterectomy of the common femoral and profunda femoris was then performed under direct visualization beginning in the distal external iliac artery and extending down to the fourth order branches of the profunda femoris. This created an endarterectomy was  approximately 7 cm in length. At this time the origin of the superficial femoral artery was endarterectomized using an eversion technique down to the point that a stent was encountered. This did create approximately a 15 mm cul-de-sac which will later be used as the origin for the bypass graft. A Cor matrix patch was then rehydrated on the field and sewn to repair the arteriotomy using running 6-0 Prolene.  The distal 10 mm of the popliteal artery as well as the entire tibioperoneal trunk was then opened using an 11 blade and subsequent Potts scissors. Freer elevator was then used to perform endarterectomy of both of these vessels under direct visualization. Again the specimen was passed off the field.    The distal flow graft was then approximated to the below-knee arteriotomy and an end distal flow to side tibioperoneal artery anastomosis was fashioned using running CV 7 suture. A gap was left in the medial suture line to allow for flushing. Subsequently, the vein previously harvested was dilated on the back table flushed with heparin. Small leaks were controlled with 6-0 Prolene it was then marked and reversed. An arteriotomy was made in the graft at the level of the popliteal and an end vein to side graft anastomosis was fashioned using running CV 7 suture. A small of leak was controlled with a 6-0 Prolene interrupted suture. Attention was then returned to the distal flow as noted above the origin of the SFA was then selected as the site for the bypass graft anastomosis. The common femoral profunda femoris and were controlled with vascular clamps arteriotomy  was made in the SFA further endarterectomy was performed under direct visualization. The proximal extent of the bypass vein was trimmed to the appropriate shape.  The PTFE graft was sewn to the superficial femoral artery in an end-to-side configuration with a running stitch of Gore CV 6 suture.  Prior to completing the suture line the anastomosis is  flushed and subsequently the suture line is completed. Flow was then reestablished to the profunda femoris artery.  The anastomosis is checked for leaks and the graft forward flushed and then it is clamped proximally.  The medial gap in the distal suture line was then completed without difficulty after flushing maneuvers and subtotally flow was established in a forward direction through the distal flow graft.  It was at this point that the inflow was noted to be very poor with a very weakly palpable pulse however upon inspecting the mid external iliac there was a very strong pulse not localizing a residual high-grade stenosis within the external iliac. Based on this C-arm was brought into the room a 7 French sheath was started under direct visualization in the profunda femoris and using a Kumpe catheter and a Glidewire the stenosis in the mid external iliac was crossed catheter was advanced into the aorta were hand injection contrast demonstrated proper intraluminal positioning. The actual lesion was then interrogated under magnified imaging again with hand injection. AV 18 wire was then advanced through the catheter and subsequently an 8 x 50 Viabahn stent was deployed and postdilated with a 7 mm balloon. Follow-up imaging now demonstrated a widely patent external iliac and clinically there is now normalization of the pulse within the graft as well as the profunda femoris.  Excellent forward flow was also noted through the vein graft. The medial wound was packed with a laparotomy pad and the anterior tibial artery controlled with Silastic vessel loops. Arteriotomy was then made with 11 blade and extended with Potts scissors the reversed saphenous vein was trimmed to the appropriate length and spatulated and an end vein to side anterior tibial anastomosis was fashioned using running 6-0 Prolene. Flushing maneuvers were performed and flow was reestablished through the vein graft down to the foot. Palpable  dorsalis pedis pulse is now clinically evident.  All the wounds were then irrigated with sterile saline.  The wounds were then inspected for hemostasis. Bleeding points were controlled with electrocautery, and suture repair of active bleeding points.  Fibrillar and EvaSeal were then placed in the bed of the wounds. Once hemostasis was achieved.  The groin incision was then closed in multiple layers using both 2-0 and 3-0 Vicryl in interrupted and running fashion. Skin was closed with a staples. The medial and lateral calf and the medial thigh wound were then closed layers using  3-0 Vicryl, and staples for the calf incisions and 4-0 Monocryl subcuticular for the 2 thigh incisions.   Honeycomb dressings were applied to each incision.   COMPLICATIONS: None  CONDITION: Stable  Renford Dills, M.D. [facility] Office: [phone]  12/23/2015, 2:56 PM

## 2015-12-23 NOTE — Anesthesia Preprocedure Evaluation (Signed)
Anesthesia Evaluation  Patient identified by MRN, date of birth, ID band Patient awake    Reviewed: Allergy & Precautions, NPO status , Patient's Chart, lab work & pertinent test results  History of Anesthesia Complications Negative for: history of anesthetic complications  Airway Mallampati: II  TM Distance: >3 FB Neck ROM: Full    Dental  (+) Upper Dentures, Lower Dentures   Pulmonary neg COPD, former smoker,    breath sounds clear to auscultation- rhonchi (-) wheezing      Cardiovascular Exercise Tolerance: Good hypertension, Pt. on medications + Peripheral Vascular Disease  (-) CAD and (-) Past MI  Rhythm:Regular Rate:Normal - Systolic murmurs and - Diastolic murmurs    Neuro/Psych negative psych ROS   GI/Hepatic negative GI ROS, Neg liver ROS,   Endo/Other  negative endocrine ROSneg diabetes  Renal/GU negative Renal ROS     Musculoskeletal negative musculoskeletal ROS (+)   Abdominal (+) - obese,   Peds  Hematology negative hematology ROS (+)   Anesthesia Other Findings Past Medical History: No date: Hyperlipidemia No date: Hypertension No date: Peripheral vascular disease (HCC)   Reproductive/Obstetrics                             Anesthesia Physical  Anesthesia Plan  ASA: II  Anesthesia Plan: General   Post-op Pain Management:    Induction: Intravenous  Airway Management Planned: LMA  Additional Equipment:   Intra-op Plan:   Post-operative Plan:   Informed Consent: I have reviewed the patients History and Physical, chart, labs and discussed the procedure including the risks, benefits and alternatives for the proposed anesthesia with the patient or authorized representative who has indicated his/her understanding and acceptance.   Dental advisory given  Plan Discussed with: CRNA and Anesthesiologist  Anesthesia Plan Comments:         Lab Results   Component Value Date   WBC 9.8 12/22/2015   HGB 10.8 (L) 12/22/2015   HCT 29.8 (L) 12/22/2015   MCV 91.2 12/22/2015   PLT 422 12/22/2015    Anesthesia Quick Evaluation

## 2015-12-23 NOTE — Anesthesia Procedure Notes (Signed)
Procedure Name: LMA Insertion Date/Time: 12/23/2015 7:39 AM Performed by: Henrietta HooverPOPE, Jamicia Haaland Pre-anesthesia Checklist: Patient identified, Emergency Drugs available, Suction available, Patient being monitored and Timeout performed Patient Re-evaluated:Patient Re-evaluated prior to inductionOxygen Delivery Method: Circle system utilized Preoxygenation: Pre-oxygenation with 100% oxygen Intubation Type: IV induction Ventilation: Mask ventilation without difficulty LMA: LMA inserted LMA Size: 4.0 Grade View: Grade I

## 2015-12-24 ENCOUNTER — Encounter: Payer: Self-pay | Admitting: Vascular Surgery

## 2015-12-24 LAB — BASIC METABOLIC PANEL
ANION GAP: 7 (ref 5–15)
BUN: 8 mg/dL (ref 6–20)
CHLORIDE: 106 mmol/L (ref 101–111)
CO2: 24 mmol/L (ref 22–32)
Calcium: 8.3 mg/dL — ABNORMAL LOW (ref 8.9–10.3)
Creatinine, Ser: 0.56 mg/dL — ABNORMAL LOW (ref 0.61–1.24)
GFR calc non Af Amer: >60 mL/min (ref >60)
GLUCOSE: 124 mg/dL — AB (ref 65–99)
POTASSIUM: 4.1 mmol/L (ref 3.5–5.1)
SODIUM: 137 mmol/L (ref 135–145)

## 2015-12-24 LAB — TYPE AND SCREEN
ABO/RH(D): B POS
ANTIBODY SCREEN: NEGATIVE
UNIT DIVISION: 0
UNIT DIVISION: 0

## 2015-12-24 LAB — HEPARIN LEVEL (UNFRACTIONATED): Heparin Unfractionated: 0.4 IU/mL (ref 0.30–0.70)

## 2015-12-24 LAB — CBC
HEMATOCRIT: 23.7 % — AB (ref 40.0–52.0)
HEMOGLOBIN: 8.4 g/dL — AB (ref 13.0–18.0)
MCH: 32.2 pg (ref 26.0–34.0)
MCHC: 35.7 g/dL (ref 32.0–36.0)
MCV: 90.4 fL (ref 80.0–100.0)
Platelets: 438 10*3/uL (ref 150–440)
RBC: 2.62 MIL/uL — ABNORMAL LOW (ref 4.40–5.90)
RDW: 12.9 % (ref 11.5–14.5)
WBC: 11.3 10*3/uL — ABNORMAL HIGH (ref 3.8–10.6)

## 2015-12-24 MED ORDER — HYDROMORPHONE HCL 1 MG/ML IJ SOLN
1.0000 mg | INTRAMUSCULAR | Status: DC | PRN
Start: 2015-12-24 — End: 2015-12-29
  Administered 2015-12-24 – 2015-12-28 (×9): 1 mg via INTRAVENOUS
  Filled 2015-12-24 (×12): qty 1

## 2015-12-24 MED ORDER — OXYCODONE HCL 5 MG PO TABS
10.0000 mg | ORAL_TABLET | ORAL | Status: DC | PRN
  Administered 2015-12-24 – 2015-12-29 (×12): 10 mg via ORAL
  Filled 2015-12-24 (×11): qty 2

## 2015-12-24 NOTE — Progress Notes (Signed)
ANTICOAGULATION CONSULT NOTE - Initial Consult  Pharmacy Consult for Heparin  Indication: limb ischemia  Allergies  Allergen Reactions  . Hydrocodone Swelling    Patient Measurements: Height: 5\' 7"  (170.2 cm) Weight: 143 lb 4.8 oz (65 kg) IBW/kg (Calculated) : 66.1 Heparin Dosing Weight:   Vital Signs: Temp: 98.4 F (36.9 C) (08/10 2000) Temp Source: Oral (08/10 2000) BP: 128/78 (08/10 2000) Pulse Rate: 79 (08/10 2015)  Labs:  Recent Labs  12/21/15 1825 12/22/15 0406 12/22/15 1152 12/22/15 1936 12/23/15 0436 12/24/15 0107  HGB 11.4* 10.8*  --   --  10.0* 8.4*  HCT 32.1* 29.8*  --   --  28.5* 23.7*  PLT 443* 422  --   --  440 438  APTT 37* 55* 51* 68*  --   --   LABPROT 15.7*  --   --   --   --   --   INR 1.24  --   --   --   --   --   HEPARINUNFRC 1.38*  --  0.58 0.69 0.49 0.40  CREATININE 0.86  --   --   --   --  0.56*    Estimated Creatinine Clearance: 90.3 mL/min (by C-G formula based on SCr of 0.8 mg/dL).   Medical History: Past Medical History:  Diagnosis Date  . Collagen vascular disease (HCC)   . Hyperlipidemia   . Hypertension   . Peripheral vascular disease (HCC)     Medications:  Scheduled:  . aspirin EC  81 mg Oral Daily  . cefUROXime (ZINACEF)  IV  1.5 g Intravenous Q12H  . docusate sodium  100 mg Oral BID  . famotidine (PEPCID) IV  20 mg Intravenous Q12H  . multivitamin with minerals  1 tablet Oral QPM  . omega-3 acid ethyl esters  1 g Oral Daily  . rosuvastatin  20 mg Oral Daily  . sodium chloride flush  3 mL Intravenous Q12H    Assessment: Pt is a [age] year old male who is a direct admit by Dr. Wyn Quakerew for arterial insufficiency with rest pain. Pharmacy is consulted to dose a heparin drip. Pt is on apixaban at home. Last dose was yesterday according to RN. Ok to start heparin drip now as it has been >12 hours since last apixaban dose. Will get a baseline HL, APTT, INR and CBC. Will most likely need to dose based off of APTT until HL and  APTT correlate if baseline HL is >0.1. Follow up on baseline HL. Due to recent anticoaulation use, will give a smaller bolus than normal.  8/09:  HL @ 19:36 = 0.69 ,  APTT = 68   Goal of Therapy:  Heparin level 0.3-0.7 units/ml Monitor platelets by anticoagulation protocol: Yes   Plan:  Since this pt's aptt and HL are now therapeutic will now use HL to guide heparin dosing.  Will continue this pt on current heparin rate and recheck HL on 8/10 with AM labs.   08/10 heparin level 0.49. Continue current regimen, Recheck CBC and heparin level with tomorrow AM labs.  8/11 01:00 heparin level 0.40. Drip was apparently suspended for procedure and then restarted. Since patient is therapeutic at previous rate, will order next heparin level and CBC with tomorrow AM labs.  Elmyra Banwart S 12/24/2015,3:48 AM

## 2015-12-24 NOTE — Evaluation (Signed)
Physical Therapy Evaluation Patient Details Name: Travis SkinnerSammy T Manzer MRN: [id number] DOB: [birth year] Today's Date: 12/24/2015   History of Present Illness  presented to ER/hospital secondary to R LE ischemic rest pain; status post R LE revascularization (R popliteal to anterior tibial artery bypass, with additional endarterectomy and stent placement), 12/23/15.  Clinical Impression  Upon evaluation, patient alert and oriented; very fearful/anxious for initiation of mobility due to pain.  R LE generally edematous (distal > proximal); incisions clean, dry and dressings intact.  Demonstrates fair/good post-op strength and ROM in R LE, limited primarily by pain (but functional for basic transfers and mobility).  Currently requiring min assist for bed mobility, sit/stand and short-distance gait (4-5 steps) with RW; tends to maintain R LE in complete NWB for pain control.  Good standing balance and good safety awareness noted.  Additional mobility declined due to patient fatigue, pain and anxiety regarding additional mobility.  Will need to increase gait distance and complete stair training prior to discharge home. Would benefit from skilled PT to address above deficits and promote optimal return to PLOF; anticipate patient okay to discharge home with HHPT as medical status and pain control improves (if patient able to safely increase gait distance and negotiate stairs).    Follow Up Recommendations Home health PT (if able to safely negotiate stairs prior to discharge)    Equipment Recommendations  Rolling walker with 5" wheels    Recommendations for Other Services       Precautions / Restrictions Precautions Precautions: Fall Precaution Comments: heparin drip Restrictions Weight Bearing Restrictions: No RLE Weight Bearing: Non weight bearing      Mobility  Bed Mobility Overal bed mobility: Needs Assistance Bed Mobility: Supine to Sit     Supine to sit: Min assist         Transfers Overall transfer level: Needs assistance Equipment used: Rolling walker (2 wheeled) Transfers: Sit to/from Stand Sit to Stand: Min assist         General transfer comment: tends to maintain R LE in NWB with movement transitions  Ambulation/Gait Ambulation/Gait assistance: Min assist Ambulation Distance (Feet): 4 Feet Assistive device: Rolling walker (2 wheeled)       General Gait Details: hop-to gait performance with R LE in complete NWB (due to pain).  Very slow and deliberate, but steady without buckling or LOB. Patient very fearful/anxious of mobility  Stairs            Wheelchair Mobility    Modified Rankin (Stroke Patients Only)       Balance Overall balance assessment: Needs assistance Sitting-balance support: No upper extremity supported;Feet supported Sitting balance-Leahy Scale: Normal     Standing balance support: Bilateral upper extremity supported Standing balance-Leahy Scale: Fair                               Pertinent Vitals/Pain Pain Assessment: 0-10 Pain Score: 4  Pain Location: R LE Pain Descriptors / Indicators: Aching Pain Intervention(s): Limited activity within patient's tolerance;Monitored during session;Repositioned (meds given prior to session)    Home Living Family/patient expects to be discharged to:: Private residence Living Arrangements: Spouse/significant other Available Help at Discharge: Family Type of Home: House Home Access: Stairs to enter Entrance Stairs-Rails: None Secretary/administratorntrance Stairs-Number of Steps: 2 Home Layout: One level Home Equipment: Crutches Additional Comments: Wife works from home and is available to assist patient as needed    Prior Function Level of Independence: Independent  Comments: Indep without use of assist device at true baseline; has recently been utilizing bilat axillary crutches with mobility due to worsening pain in R LE     Hand Dominance         Extremity/Trunk Assessment   Upper Extremity Assessment: Overall WFL for tasks assessed           Lower Extremity Assessment: Overall WFL for tasks assessed (R LE very guarded (due to pain), but demonstrates functional ROM with strength at least 3-/5)         Communication   Communication: No difficulties  Cognition Arousal/Alertness: Awake/alert Behavior During Therapy: WFL for tasks assessed/performed;Anxious Overall Cognitive Status: Within Functional Limits for tasks assessed                      General Comments General comments (skin integrity, edema, etc.): R LE groin incision clean and dry; additional LE incisions with honeycomb dressing, mild drainage (unchanged with mobility)    Exercises Other Exercises Other Exercises: Supine R LE therex, act assist (within tolerable range), 1x10: ankle pumps, SAQs, heel slides, hip abduct/adduct and SLR.  Encouraged performance outside of therapy for continued strengthening, circulation and edema management.  Patient/wife voiced understanding.      Assessment/Plan    PT Assessment Patient needs continued PT services  PT Diagnosis Difficulty walking;Generalized weakness;Acute pain   PT Problem List Decreased strength;Decreased range of motion;Decreased activity tolerance;Decreased balance;Decreased mobility;Decreased skin integrity;Pain  PT Treatment Interventions DME instruction;Gait training;Stair training;Functional mobility training;Therapeutic activities;Therapeutic exercise;Balance training;Patient/family education   PT Goals (Current goals can be found in the Care Plan section) Acute Rehab PT Goals Patient Stated Goal: to try a little PT Goal Formulation: With patient Time For Goal Achievement: 01/07/16 Potential to Achieve Goals: Good    Frequency 7X/week   Barriers to discharge        Co-evaluation               End of Session Equipment Utilized During Treatment: Gait belt Activity Tolerance:  Patient limited by pain Patient left: in chair;with call bell/phone within reach;with family/visitor present;with nursing/sitter in room Nurse Communication: Mobility status         Time: 8119-1478 PT Time Calculation (min) (ACUTE ONLY): 31 min   Charges:   PT Evaluation $PT Eval Moderate Complexity: 1 Procedure PT Treatments $Therapeutic Exercise: 8-22 mins   PT G Codes:       Kalin Kyler H. Manson Passey, PT, DPT, NCS 12/24/15, 3:13 PM [phone]

## 2015-12-24 NOTE — Progress Notes (Signed)
[facility]  Daily Progress Note   Subjective  - 1 Day Post-Op  Appropriately sore No signs of bleeding.  Left foot pain gone No N/V No signs of bleeding On heparin  Objective Vitals:   12/24/15 0600 12/24/15 0700 12/24/15 0800 12/24/15 0804  BP: 114/73 126/80 127/68   Pulse: 70 75 80   Resp: (!) 9 15 14    Temp:   98 F (36.7 C) 98 F (36.7 C)  TempSrc:   Oral Oral  SpO2: 96% 99% 98%   Weight:      Height:        Intake/Output Summary (Last 24 hours) at 12/24/15 0833 Last data filed at 12/24/15 0600  Gross per 24 hour  Intake          3993.58 ml  Output             1850 ml  Net          2143.58 ml    PULM  CTAB CV  RRR VASC  2+ right DP pulse, incisions C/D/I  Laboratory CBC    Component Value Date/Time   WBC 11.3 (H) 12/24/2015 0107   HGB 8.4 (L) 12/24/2015 0107   HCT 23.7 (L) 12/24/2015 0107   PLT 438 12/24/2015 0107    BMET    Component Value Date/Time   NA 137 12/24/2015 0107   K 4.1 12/24/2015 0107   CL 106 12/24/2015 0107   CO2 24 12/24/2015 0107   GLUCOSE 124 (H) 12/24/2015 0107   BUN 8 12/24/2015 0107   CREATININE 0.56 (L) 12/24/2015 0107   CALCIUM 8.3 (L) 12/24/2015 0107   GFRNONAA >60 12/24/2015 0107   GFRAA >60 12/24/2015 0107    Assessment/Planning: POD #1 s/p extensive surgery to revascularize RLE   Doing well  To floor today  Regular diet  Heparin drip  Recheck CBC tomorrow am    DEW,JASON  12/24/2015, 8:33 AM

## 2015-12-24 NOTE — Progress Notes (Signed)
Pt transferred to room 221, report given to Buddy DutyJosh Simser, RN receiving pt in room 221.  Pt is alert and oriented, nsr, lungs clear on room air.  Tolerating regular diet.  850 ml urine output from foley thus far, foley to remain in place until tomorrow post op day 2 per md order.  Foley care provided.  VSS, afebrile.  Pt up to chair with physical therapy.  Remains on heparin drip at 13.5 ml/hr, ns at 100 ml/hr.  Right femoral incision site and right calf/lower leg incisions clean dry and intact.  Pt does complain of pain in right groin and leg, but controlled/tolerable with prn dilaudid and oxycodone.

## 2015-12-24 NOTE — Progress Notes (Signed)
Pt was able to rest last night . Pain was controlled with 2 mg dilaudid x 3. Pt in stable condition at this time. Report given to Barron SchmidJerimiah, Charity fundraiserN.

## 2015-12-25 LAB — CBC
HEMATOCRIT: 21.7 % — AB (ref 40.0–52.0)
Hemoglobin: 7.8 g/dL — ABNORMAL LOW (ref 13.0–18.0)
MCH: 32.4 pg (ref 26.0–34.0)
MCHC: 36 g/dL (ref 32.0–36.0)
MCV: 89.9 fL (ref 80.0–100.0)
PLATELETS: 424 10*3/uL (ref 150–440)
RBC: 2.42 MIL/uL — ABNORMAL LOW (ref 4.40–5.90)
RDW: 13.2 % (ref 11.5–14.5)
WBC: 11.9 10*3/uL — AB (ref 3.8–10.6)

## 2015-12-25 LAB — BASIC METABOLIC PANEL
ANION GAP: 6 (ref 5–15)
BUN: 7 mg/dL (ref 6–20)
CALCIUM: 8.5 mg/dL — AB (ref 8.9–10.3)
CO2: 26 mmol/L (ref 22–32)
Chloride: 103 mmol/L (ref 101–111)
Creatinine, Ser: 0.61 mg/dL (ref 0.61–1.24)
Glucose, Bld: 121 mg/dL — ABNORMAL HIGH (ref 65–99)
Potassium: 3.6 mmol/L (ref 3.5–5.1)
SODIUM: 135 mmol/L (ref 135–145)

## 2015-12-25 LAB — HEPARIN LEVEL (UNFRACTIONATED): Heparin Unfractionated: 0.4 IU/mL (ref 0.30–0.70)

## 2015-12-25 LAB — MAGNESIUM: MAGNESIUM: 1.8 mg/dL (ref 1.7–2.4)

## 2015-12-25 MED ORDER — GABAPENTIN 300 MG PO CAPS
300.0000 mg | ORAL_CAPSULE | Freq: Every day | ORAL | Status: DC
  Administered 2015-12-25 – 2015-12-28 (×4): 300 mg via ORAL
  Filled 2015-12-25 (×4): qty 1

## 2015-12-25 MED ORDER — FAMOTIDINE 20 MG PO TABS
20.0000 mg | ORAL_TABLET | Freq: Two times a day (BID) | ORAL | Status: DC
  Administered 2015-12-25 – 2015-12-29 (×9): 20 mg via ORAL
  Filled 2015-12-25 (×9): qty 1

## 2015-12-25 NOTE — Progress Notes (Signed)
[facility]  Daily Progress Note   Subjective  - 2 Day Post-Op  Appropriately sore No signs of bleeding.  Left foot pain resolved    Objective Vitals:   12/24/15 2029 12/25/15 0500 12/25/15 0516 12/25/15 1232  BP: (!) 122/58  127/63 (!) 147/72  Pulse: 89  88 90  Resp: 20  20 20   Temp: 99.2 F (37.3 C)  98.7 F (37.1 C) 98.3 F (36.8 C)  TempSrc: Oral  Oral Oral  SpO2: 100%  98% 100%  Weight:  72.1 kg (159 lb)    Height:        Intake/Output Summary (Last 24 hours) at 12/25/15 1351 Last data filed at 12/25/15 1100  Gross per 24 hour  Intake             2544 ml  Output             2129 ml  Net              415 ml    PULM  CTAB CV  RRR VASC  2+ right DP pulse, incisions C/D/I, 3+ right leg edema with hyperemic response  Laboratory CBC    Component Value Date/Time   WBC 11.9 (H) 12/25/2015 0521   HGB 7.8 (L) 12/25/2015 0521   HCT 21.7 (L) 12/25/2015 0521   PLT 424 12/25/2015 0521    BMET    Component Value Date/Time   NA 135 12/25/2015 0521   K 3.6 12/25/2015 0521   CL 103 12/25/2015 0521   CO2 26 12/25/2015 0521   GLUCOSE 121 (H) 12/25/2015 0521   BUN 7 12/25/2015 0521   CREATININE 0.61 12/25/2015 0521   CALCIUM 8.5 (L) 12/25/2015 0521   GFRNONAA >60 12/25/2015 0521   GFRAA >60 12/25/2015 0521    Assessment/Planning: POD #2 s/p extensive surgery to revascularize RLE   Doing well, moderate reperfusion edema  Start Neurontin  Regular diet  Heparin drip continues  Recheck CBC tomorrow am    Renford DillsSchnier, Natalio Salois G  12/25/2015, 1:51 PM

## 2015-12-25 NOTE — Progress Notes (Signed)
Key Points: Use following P&T approved IV to PO antibiotic change policy.  Description contains the criteria that are approved Note: Policy Excludes:  Esophagectomy patientsPHARMACIST - PHYSICIAN COMMUNICATION DR:   Wyn Quakerew CONCERNING: IV to Oral Route Change Policy  RECOMMENDATION: This patient is receiving famotidine by the intravenous route.  Based on criteria approved by the Pharmacy and Therapeutics Committee, the intravenous medication(s) is/are being converted to the equivalent oral dose form(s).   DESCRIPTION: These criteria include:  The patient is eating (either orally or via tube) and/or has been taking other orally administered medications for a least 24 hours  The patient has no evidence of active gastrointestinal bleeding or impaired GI absorption (gastrectomy, short bowel, patient on TNA or NPO).  If you have questions about this conversion, please contact the Pharmacy Department  []   [phone]( [phone] )  [facility] [x]   [phone]( [phone] )  [facility] []   [phone]( [phone] )  [facility] []   [phone]( [phone] )  [facility] []   [phone]( [phone] )  [facility]   Valentina GuChristy, Jassmin Kemmerer D, [facility]PH 12/25/2015 9:47 AM

## 2015-12-25 NOTE — Progress Notes (Signed)
Patient set goal to aim to take prescribed Oral pain medication prn (pt progressed well), pt tolerated today's activities well and is asleep in bed.   Pt refuses bed alarm and was advised of fall precautions and injury/fall prevention. Pt states that he will call out for assistance, if he desires to get up and go to bathroom.   Report given to oncoming shift RN, continue to assess.

## 2015-12-25 NOTE — Progress Notes (Signed)
RN removed foley, per managed order. Pt has until 1119 am to void.   [name] [name]

## 2015-12-25 NOTE — Progress Notes (Signed)
Physical Therapy Treatment Patient Details Name: SQUARE JOWETT MRN: [id number] DOB: [birth year] Today's Date: 12/25/2015    History of Present Illness 60 y/o male presented to ER/hospital secondary to R LE ischemic rest pain; status post R LE revascularization (R popliteal to anterior tibial artery bypass, with additional endarterectomy and stent placement), 12/23/15.    PT Comments    Pt shows good willingness to participate with PT and push himself during ambulation.  He c/o signficant pain and needs multiple standing (and one sitting) rest breaks, but ultimately does well with O2 remaining 99% t/o the effort.  Pt initially very guarded and hesitant with ambulation but improved with increased distance.   Follow Up Recommendations  Home health PT     Equipment Recommendations  Rolling walker with 5" wheels    Recommendations for Other Services       Precautions / Restrictions Precautions Precautions: Fall Restrictions Weight Bearing Restrictions: No RLE Weight Bearing: Weight bearing as tolerated    Mobility  Bed Mobility Overal bed mobility: Modified Independent Bed Mobility: Supine to Sit;Sit to Supine     Supine to sit: Supervision Sit to supine: Min guard   General bed mobility comments: Pt is able to get R LE into/out of bed with cautious, slow movements, but was able to do so w/o assist  Transfers Overall transfer level: Modified independent Equipment used: Rolling walker (2 wheeled) Transfers: Sit to/from Stand Sit to Stand: Supervision         General transfer comment: Pt is reliant on UEs to rise to standing, able to maintain balance with only R TTWBing  Ambulation/Gait Ambulation/Gait assistance: Min guard Ambulation Distance (Feet): 60 Feet Assistive device: Rolling walker (2 wheeled)       General Gait Details: Pt is very hesitant to take more than a pound or 2 on the R LE during ambulation and though he is slow and guarded he is able to maintain  balance and with time does increase cadence and confidence.  He needed a few standing rest breaks but ultimately showed good motivation and eagerness to increase ambulation distance despite c/o severe pain.  Pt walked 25 ft and then after seated rest break another 35 ft.   Stairs            Wheelchair Mobility    Modified Rankin (Stroke Patients Only)       Balance     Sitting balance-Leahy Scale: Normal       Standing balance-Leahy Scale: Good (with RW)                      Cognition Arousal/Alertness: Awake/alert Behavior During Therapy: WFL for tasks assessed/performed;Anxious Overall Cognitive Status: Within Functional Limits for tasks assessed                      Exercises General Exercises - Lower Extremity Ankle Circles/Pumps: Strengthening;10 reps Quad Sets: Strengthening;10 reps Gluteal Sets: Strengthening;10 reps Short Arc Quad: 15 reps;Strengthening Heel Slides: 5 reps;Strengthening Hip ABduction/ADduction: Strengthening;10 reps    General Comments        Pertinent Vitals/Pain Pain Assessment:  (4/10 at rest, increases to 9/10 with WBing/gravity dependent)    Home Living                      Prior Function            PT Goals (current goals can now be found in the care plan section)  Acute Rehab PT Goals PT Goal Formulation: With patient Time For Goal Achievement: 01/07/16 Potential to Achieve Goals: Good Progress towards PT goals: Progressing toward goals    Frequency  7X/week    PT Plan Current plan remains appropriate    Co-evaluation             End of Session Equipment Utilized During Treatment: Gait belt Activity Tolerance: Patient limited by pain;Patient limited by fatigue Patient left: with call bell/phone within reach;with chair alarm set     Time: 1146-1230 PT Time Calculation (min) (ACUTE ONLY): 44 min  Charges:  $Gait Training: 8-22 mins $Therapeutic Exercise: 23-37 mins                     G Codes:      Malachi ProGalen R Nakema Fake, DPT 12/25/2015, 2:48 PM

## 2015-12-25 NOTE — Progress Notes (Signed)
ANTICOAGULATION CONSULT NOTE - Initial Consult  Pharmacy Consult for Heparin  Indication: limb ischemia  Allergies  Allergen Reactions  . Hydrocodone Swelling    Patient Measurements: Height: 5\' 7"  (170.2 cm) Weight: 152 lb 8.9 oz (69.2 kg) IBW/kg (Calculated) : 66.1 Heparin Dosing Weight:   Vital Signs: Temp: 98.7 F (37.1 C) (08/12 0516) Temp Source: Oral (08/12 0516) BP: 127/63 (08/12 0516) Pulse Rate: 88 (08/12 0516)  Labs:  Recent Labs  12/22/15 1152 12/22/15 1936  12/23/15 0436 12/24/15 0107 12/25/15 0521  HGB  --   --   < > 10.0* 8.4* 7.8*  HCT  --   --   --  28.5* 23.7* 21.7*  PLT  --   --   --  440 438 424  APTT 51* 68*  --   --   --   --   HEPARINUNFRC 0.58 0.69  --  0.49 0.40 0.40  CREATININE  --   --   --   --  0.56* 0.61  < > = values in this interval not displayed.  Estimated Creatinine Clearance: 91.8 mL/min (by C-G formula based on SCr of 0.8 mg/dL).   Medical History: Past Medical History:  Diagnosis Date  . Collagen vascular disease (HCC)   . Hyperlipidemia   . Hypertension   . Peripheral vascular disease (HCC)     Medications:  Scheduled:  . aspirin EC  81 mg Oral Daily  . docusate sodium  100 mg Oral BID  . famotidine (PEPCID) IV  20 mg Intravenous Q12H  . multivitamin with minerals  1 tablet Oral QPM  . omega-3 acid ethyl esters  1 g Oral Daily  . rosuvastatin  20 mg Oral Daily  . sodium chloride flush  3 mL Intravenous Q12H    Assessment: Pt is a 60 year old male who is a direct admit by Dr. Wyn Quakerew for arterial insufficiency with rest pain. Pharmacy is consulted to dose a heparin drip. Pt is on apixaban at home. Last dose was yesterday according to RN. Ok to start heparin drip now as it has been >12 hours since last apixaban dose. Will get a baseline HL, APTT, INR and CBC. Will most likely need to dose based off of APTT until HL and APTT correlate if baseline HL is >0.1. Follow up on baseline HL. Due to recent anticoaulation use,  will give a smaller bolus than normal.  8/09:  HL @ 19:36 = 0.69 ,  APTT = 68   Goal of Therapy:  Heparin level 0.3-0.7 units/ml Monitor platelets by anticoagulation protocol: Yes   Plan:  Since this pt's aptt and HL are now therapeutic will now use HL to guide heparin dosing.  Will continue this pt on current heparin rate and recheck HL on 8/10 with AM labs.   08/10 heparin level 0.49. Continue current regimen, Recheck CBC and heparin level with tomorrow AM labs.  8/11 01:00 heparin level 0.40. Drip was apparently suspended for procedure and then restarted. Since patient is therapeutic at previous rate, will order next heparin level and CBC with tomorrow AM labs.  8/12 AM heparin level 0.40. Continue current regimen. Recheck heparin level and CBC with tomorrow AM labs.  Niketa Turner S 12/25/2015,6:10 AM

## 2015-12-26 LAB — BASIC METABOLIC PANEL
Anion gap: 6 (ref 5–15)
BUN: 7 mg/dL (ref 6–20)
CHLORIDE: 105 mmol/L (ref 101–111)
CO2: 27 mmol/L (ref 22–32)
Calcium: 8.4 mg/dL — ABNORMAL LOW (ref 8.9–10.3)
Creatinine, Ser: 0.73 mg/dL (ref 0.61–1.24)
GFR calc Af Amer: >60 mL/min (ref >60)
GLUCOSE: 107 mg/dL — AB (ref 65–99)
POTASSIUM: 3.6 mmol/L (ref 3.5–5.1)
Sodium: 138 mmol/L (ref 135–145)

## 2015-12-26 LAB — PREPARE RBC (CROSSMATCH)

## 2015-12-26 LAB — CBC
HEMATOCRIT: 19.9 % — AB (ref 40.0–52.0)
Hemoglobin: 7.3 g/dL — ABNORMAL LOW (ref 13.0–18.0)
MCH: 33.3 pg (ref 26.0–34.0)
MCHC: 36.8 g/dL — ABNORMAL HIGH (ref 32.0–36.0)
MCV: 90.5 fL (ref 80.0–100.0)
PLATELETS: 371 10*3/uL (ref 150–440)
RBC: 2.2 MIL/uL — ABNORMAL LOW (ref 4.40–5.90)
RDW: 13.1 % (ref 11.5–14.5)
WBC: 10 10*3/uL (ref 3.8–10.6)

## 2015-12-26 LAB — HEPARIN LEVEL (UNFRACTIONATED)
HEPARIN UNFRACTIONATED: 0.2 [IU]/mL — AB (ref 0.30–0.70)
Heparin Unfractionated: 0.36 IU/mL (ref 0.30–0.70)
Heparin Unfractionated: 0.35 IU/mL (ref 0.30–0.70)

## 2015-12-26 MED ORDER — ZOLPIDEM TARTRATE 5 MG PO TABS
5.0000 mg | ORAL_TABLET | Freq: Every evening | ORAL | Status: DC | PRN
  Administered 2015-12-26 – 2015-12-28 (×3): 5 mg via ORAL
  Filled 2015-12-26 (×3): qty 1

## 2015-12-26 MED ORDER — HEPARIN BOLUS VIA INFUSION
1000.0000 [IU] | Freq: Once | INTRAVENOUS | Status: AC
  Administered 2015-12-26: 1000 [IU] via INTRAVENOUS
  Filled 2015-12-26: qty 1000

## 2015-12-26 NOTE — Progress Notes (Signed)
ANTICOAGULATION CONSULT NOTE - Initial Consult  Pharmacy Consult for Heparin  Indication: limb ischemia  Allergies  Allergen Reactions  . Hydrocodone Swelling    Patient Measurements: Height: 5\' 7"  (170.2 cm) Weight: 152 lb 9.6 oz (69.2 kg) IBW/kg (Calculated) : 66.1 Heparin Dosing Weight:   Vital Signs: Temp: 98.7 F (37.1 C) (08/13 1339) Temp Source: Oral (08/13 1339) BP: 130/70 (08/13 1339) Pulse Rate: 82 (08/13 1339)  Labs:  Recent Labs  12/24/15 0107 12/25/15 0521 12/26/15 0555 12/26/15 1414  HGB 8.4* 7.8* 7.3*  --   HCT 23.7* 21.7* 19.9*  --   PLT 438 424 371  --   HEPARINUNFRC 0.40 0.40 0.20* 0.36  CREATININE 0.56* 0.61 0.73  --     Estimated Creatinine Clearance: 91.8 mL/min (by C-G formula based on SCr of 0.8 mg/dL).   Medical History: Past Medical History:  Diagnosis Date  . Collagen vascular disease (HCC)   . Hyperlipidemia   . Hypertension   . Peripheral vascular disease (HCC)     Medications:  Scheduled:  . aspirin EC  81 mg Oral Daily  . docusate sodium  100 mg Oral BID  . famotidine  20 mg Oral BID  . gabapentin  300 mg Oral QHS  . multivitamin with minerals  1 tablet Oral QPM  . omega-3 acid ethyl esters  1 g Oral Daily  . rosuvastatin  20 mg Oral Daily  . sodium chloride flush  3 mL Intravenous Q12H    Assessment: Pt is a 60 year old male who is a direct admit by Dr. Wyn Quakerew for arterial insufficiency with rest pain. Pharmacy is consulted to dose a heparin drip. Pt is on apixaban at home.   Goal of Therapy:  Heparin level 0.3-0.7 units/ml Monitor platelets by anticoagulation protocol: Yes   Plan:  HL= 0.36. Will continue heparin infusion at 1500 units/hr and check a confirmatory level in 6 hours.    Luisa HartChristy, Dwyne Hasegawa D 12/26/2015,3:01 PM

## 2015-12-26 NOTE — Progress Notes (Signed)
Pt did very well throughout the night with only taking oral pain medication, however, he still complains that he "can barely sleep and can not get any rest with the soreness/pain the right leg keeps giving him." Will continue to monitor.   [name] [name]

## 2015-12-26 NOTE — Progress Notes (Signed)
[facility]  Daily Progress Note   Subjective  - 3 Day Post-Op  Appropriately sore, seems "better" today No signs of bleeding.  Left foot pain resolved    Objective Vitals:   12/25/15 2027 12/26/15 0427 12/26/15 0500 12/26/15 1339  BP: (!) 116/58 131/68  130/70  Pulse: 85 81  82  Resp: 16 16  18   Temp: 99 F (37.2 C) 98.3 F (36.8 C)  98.7 F (37.1 C)  TempSrc: Oral Oral  Oral  SpO2: 98% 100%  100%  Weight:   69.2 kg (152 lb 9.6 oz)   Height:   5\' 7"  (1.702 m)     Intake/Output Summary (Last 24 hours) at 12/26/15 1452 Last data filed at 12/26/15 1154  Gross per 24 hour  Intake             1754 ml  Output             2275 ml  Net             -521 ml    PULM  CTAB CV  RRR VASC  2+ right DP pulse, incisions C/D/I, 3+ right leg edema with hyperemic response  Laboratory CBC    Component Value Date/Time   WBC 10.0 12/26/2015 0555   HGB 7.3 (L) 12/26/2015 0555   HCT 19.9 (L) 12/26/2015 0555   PLT 371 12/26/2015 0555    BMET    Component Value Date/Time   NA 138 12/26/2015 0555   K 3.6 12/26/2015 0555   CL 105 12/26/2015 0555   CO2 27 12/26/2015 0555   GLUCOSE 107 (H) 12/26/2015 0555   BUN 7 12/26/2015 0555   CREATININE 0.73 12/26/2015 0555   CALCIUM 8.4 (L) 12/26/2015 0555   GFRNONAA >60 12/26/2015 0555   GFRAA >60 12/26/2015 0555    Assessment/Planning: POD #3 s/p extensive surgery to revascularize RLE   Doing well, moderate reperfusion edema unchanged  Started Neurontin  Regular diet  Heparin drip continues  Recheck CBC tomorrow am    Renford DillsSchnier, Gregory G  12/26/2015, 2:52 PM

## 2015-12-26 NOTE — Progress Notes (Addendum)
ANTICOAGULATION CONSULT NOTE - Initial Consult  Pharmacy Consult for Heparin  Indication: limb ischemia  Allergies  Allergen Reactions  . Hydrocodone Swelling    Patient Measurements: Height: 5\' 7"  (170.2 cm) Weight: 152 lb 9.6 oz (69.2 kg) IBW/kg (Calculated) : 66.1 Heparin Dosing Weight:   Vital Signs: Temp: 98.3 F (36.8 C) (08/13 2050) Temp Source: Oral (08/13 2050) BP: 141/55 (08/13 2050) Pulse Rate: 82 (08/13 2050)  Labs:  Recent Labs  12/24/15 0107 12/25/15 0521 12/26/15 0555 12/26/15 1414 12/26/15 2003  HGB 8.4* 7.8* 7.3*  --   --   HCT 23.7* 21.7* 19.9*  --   --   PLT 438 424 371  --   --   HEPARINUNFRC 0.40 0.40 0.20* 0.36 0.35  CREATININE 0.56* 0.61 0.73  --   --     Estimated Creatinine Clearance: 91.8 mL/min (by C-G formula based on SCr of 0.8 mg/dL).   Medical History: Past Medical History:  Diagnosis Date  . Collagen vascular disease (HCC)   . Hyperlipidemia   . Hypertension   . Peripheral vascular disease (HCC)     Medications:  Scheduled:  . aspirin EC  81 mg Oral Daily  . docusate sodium  100 mg Oral BID  . famotidine  20 mg Oral BID  . gabapentin  300 mg Oral QHS  . multivitamin with minerals  1 tablet Oral QPM  . omega-3 acid ethyl esters  1 g Oral Daily  . rosuvastatin  20 mg Oral Daily  . sodium chloride flush  3 mL Intravenous Q12H    Assessment: Pt is a 60 year old male who is a direct admit by Dr. Wyn Quakerew for arterial insufficiency with rest pain. Pharmacy is consulted to dose a heparin drip. Pt is on apixaban at home.   Goal of Therapy:  Heparin level 0.3-0.7 units/ml Monitor platelets by anticoagulation protocol: Yes   Plan:  HL= 0.35. Will continue heparin infusion at 1500 units/hr and check HL and CBC with AM labs tomorrow.    8/14 AM heparin level 0.34. Continue current regimen. Recheck CBC and heparin level with tomorrow AM labs.  Cindi CarbonMary M Swayne, PharmD 12/26/2015,9:02 PM

## 2015-12-26 NOTE — Progress Notes (Signed)
ANTICOAGULATION CONSULT NOTE - Initial Consult  Pharmacy Consult for Heparin  Indication: limb ischemia  Allergies  Allergen Reactions  . Hydrocodone Swelling    Patient Measurements: Height: 5\' 7"  (170.2 cm) Weight: 152 lb 9.6 oz (69.2 kg) IBW/kg (Calculated) : 66.1 Heparin Dosing Weight:   Vital Signs: Temp: 98.3 F (36.8 C) (08/13 0427) Temp Source: Oral (08/13 0427) BP: 131/68 (08/13 0427) Pulse Rate: 81 (08/13 0427)  Labs:  Recent Labs  12/24/15 0107 12/25/15 0521 12/26/15 0555  HGB 8.4* 7.8* 7.3*  HCT 23.7* 21.7* 19.9*  PLT 438 424 371  HEPARINUNFRC 0.40 0.40 0.20*  CREATININE 0.56* 0.61 0.73    Estimated Creatinine Clearance: 91.8 mL/min (by C-G formula based on SCr of 0.8 mg/dL).   Medical History: Past Medical History:  Diagnosis Date  . Collagen vascular disease (HCC)   . Hyperlipidemia   . Hypertension   . Peripheral vascular disease (HCC)     Medications:  Scheduled:  . aspirin EC  81 mg Oral Daily  . docusate sodium  100 mg Oral BID  . famotidine  20 mg Oral BID  . gabapentin  300 mg Oral QHS  . heparin  1,000 Units Intravenous Once  . multivitamin with minerals  1 tablet Oral QPM  . omega-3 acid ethyl esters  1 g Oral Daily  . rosuvastatin  20 mg Oral Daily  . sodium chloride flush  3 mL Intravenous Q12H    Assessment: Pt is a 60 year old male who is a direct admit by Dr. Wyn Quakerew for arterial insufficiency with rest pain. Pharmacy is consulted to dose a heparin drip. Pt is on apixaban at home.   Goal of Therapy:  Heparin level 0.3-0.7 units/ml Monitor platelets by anticoagulation protocol: Yes   Plan:  HL= 0.2 and not paused overnight per RN. Will bolus heparin 1000 units iv and increase infusion to 1500 units/hr. Will recheck a HL in 6 hours.   Luisa HartChristy, Shealee Yordy D 12/26/2015,7:11 AM

## 2015-12-26 NOTE — Progress Notes (Signed)
Physical Therapy Treatment Patient Details Name: Travis Salas MRN: [id number] DOB: Jun 08, 1955 Today's Date: 12/26/2015    History of Present Illness 60 y/o male presented to ER/hospital secondary to R LE ischemic rest pain; status post R LE revascularization (R popliteal to anterior tibial artery bypass, with additional endarterectomy and stent placement), 12/23/15.    PT Comments    Pt is able to increase ambulation distance today and shows more confidence with the effort.  He continues to be slow and cautious but needed less cuing and rest breaks.  He is eager to work with PT but still lacks confidence.   Follow Up Recommendations  Home health PT     Equipment Recommendations  Rolling walker with 5" wheels    Recommendations for Other Services       Precautions / Restrictions Precautions Precautions: Fall Restrictions RLE Weight Bearing: Weight bearing as tolerated    Mobility  Bed Mobility Overal bed mobility: Modified Independent Bed Mobility: Supine to Sit;Sit to Supine     Supine to sit: Supervision Sit to supine: Min guard   General bed mobility comments: Pt does well with getting in/out of bed, shows more R LE control.  Transfers Overall transfer level: Modified independent Equipment used: Rolling walker (2 wheeled) Transfers: Sit to/from Stand Sit to Stand: Supervision         General transfer comment: Pt does well getting to standing and does not need excessive UE use and maintains balance well with walker.  Ambulation/Gait Ambulation/Gait assistance: Min guard Ambulation Distance (Feet): 70 Feet Assistive device: Rolling walker (2 wheeled)       General Gait Details: 2 bouts of 35 ft with brief seated rest break.  Pt was able to put a little more weight on the R heel and forefoot and shows increased cadence and confidence.   Stairs            Wheelchair Mobility    Modified Rankin (Stroke Patients Only)       Balance      Sitting balance-Leahy Scale: Normal       Standing balance-Leahy Scale: Good                      Cognition Arousal/Alertness: Awake/alert Behavior During Therapy: WFL for tasks assessed/performed;Anxious Overall Cognitive Status: Within Functional Limits for tasks assessed                      Exercises General Exercises - Lower Extremity Ankle Circles/Pumps: Strengthening;10 reps (5 X 20 second calf stretches) Quad Sets: Strengthening;10 reps Gluteal Sets: Strengthening;10 reps Short Arc Quad: 15 reps;Strengthening Heel Slides: 5 reps;Strengthening Hip ABduction/ADduction: Strengthening;10 reps    General Comments        Pertinent Vitals/Pain Pain Assessment: 0-10 Pain Score: 6  Pain Location: Pt reports pain seems less severe today    Home Living                      Prior Function            PT Goals (current goals can now be found in the care plan section) Progress towards PT goals: Progressing toward goals    Frequency  7X/week    PT Plan Current plan remains appropriate    Co-evaluation             End of Session Equipment Utilized During Treatment: Gait belt Activity Tolerance: Patient limited by pain;Patient limited by fatigue Patient  left: with call bell/phone within reach;with chair alarm set     Time: [phone] PT Time Calculation (min) (ACUTE ONLY): 47 min  Charges:  $Gait Training: 23-37 mins $Therapeutic Exercise: 8-22 mins                    G Codes:      Malachi Pro, DPT 12/26/2015, 5:56 PM

## 2015-12-27 LAB — CBC
HEMATOCRIT: 21 % — AB (ref 40.0–52.0)
Hemoglobin: 7.4 g/dL — ABNORMAL LOW (ref 13.0–18.0)
MCH: 31.7 pg (ref 26.0–34.0)
MCHC: 35.4 g/dL (ref 32.0–36.0)
MCV: 89.6 fL (ref 80.0–100.0)
PLATELETS: 439 10*3/uL (ref 150–440)
RBC: 2.35 MIL/uL — ABNORMAL LOW (ref 4.40–5.90)
RDW: 13.4 % (ref 11.5–14.5)
WBC: 10.2 10*3/uL (ref 3.8–10.6)

## 2015-12-27 LAB — SURGICAL PATHOLOGY

## 2015-12-27 LAB — HEPARIN LEVEL (UNFRACTIONATED): HEPARIN UNFRACTIONATED: 0.34 [IU]/mL (ref 0.30–0.70)

## 2015-12-27 MED ORDER — POLYSACCHARIDE IRON COMPLEX 150 MG PO CAPS
150.0000 mg | ORAL_CAPSULE | Freq: Every day | ORAL | Status: DC
  Administered 2015-12-27 – 2015-12-29 (×3): 150 mg via ORAL
  Filled 2015-12-27 (×3): qty 1

## 2015-12-27 MED ORDER — APIXABAN 5 MG PO TABS
5.0000 mg | ORAL_TABLET | Freq: Two times a day (BID) | ORAL | Status: DC
  Administered 2015-12-28 – 2015-12-29 (×3): 5 mg via ORAL
  Filled 2015-12-27 (×3): qty 1

## 2015-12-27 NOTE — Progress Notes (Signed)
[facility]  Daily Progress Note   Subjective  - 4 Days Post-Op  Feeling better.  Walking ok with PT, but still limited Hgb stable, no signs of bleeding  Objective Vitals:   12/26/15 1339 12/26/15 2050 12/27/15 0429 12/27/15 0438  BP: 130/70 (!) 141/55  (!) 142/65  Pulse: 82 82  83  Resp: 18 20  16   Temp: 98.7 F (37.1 C) 98.3 F (36.8 C)  98 F (36.7 C)  TempSrc: Oral Oral  Oral  SpO2: 100% 100%  100%  Weight:   69.3 kg (152 lb 12.8 oz)   Height:        Intake/Output Summary (Last 24 hours) at 12/27/15 1400 Last data filed at 12/27/15 1024  Gross per 24 hour  Intake           2583.5 ml  Output             2625 ml  Net            -41.5 ml    PULM  CTAB CV  RRR VASC  2-3+ RLE edema.  Foot warm, pulses palpable  Laboratory CBC    Component Value Date/Time   WBC 10.2 12/27/2015 0434   HGB 7.4 (L) 12/27/2015 0434   HCT 21.0 (L) 12/27/2015 0434   PLT 439 12/27/2015 0434    BMET    Component Value Date/Time   NA 138 12/26/2015 0555   K 3.6 12/26/2015 0555   CL 105 12/26/2015 0555   CO2 27 12/26/2015 0555   GLUCOSE 107 (H) 12/26/2015 0555   BUN 7 12/26/2015 0555   CREATININE 0.73 12/26/2015 0555   CALCIUM 8.4 (L) 12/26/2015 0555   GFRNONAA >60 12/26/2015 0555   GFRAA >60 12/26/2015 0555    Assessment/Planning: POD #4 s/p RLE revascularization   Overall, doing pretty well  Start NuIron for anemia.  No need for transfusion currently  Saline lock iv  Start eliquis tomorrow and stop heparin several hours later  Continue PT.  Likely D/C in 2-3 days    Travis Salas  12/27/2015, 2:00 PM

## 2015-12-27 NOTE — Care Management Important Message (Deleted)
Important Message  Patient Details  Name: Travis SkinnerSammy T Matura MRN: [id number] Date of Birth: [birth year]   Medicare Important Message Given:  Yes    Chapman FitchBOWEN, Yaritzi Craun T, RN 12/27/2015, 12:30 PM

## 2015-12-27 NOTE — Progress Notes (Signed)
Physical Therapy Treatment Patient Details Name: Travis Salas MRN: [id number] DOB: 05-15-1956 Today's Date: 12/27/2015    History of Present Illness 60 y/o male presented to ER/hospital secondary to R LE ischemic rest pain; status post R LE revascularization (R popliteal to anterior tibial artery bypass, with additional endarterectomy and stent placement), 12/23/15.    PT Comments    Agrees to session.  Transitioned out of bed with increased time and rail.  He was able to amb 5370' then and additional 25' with walker and min guard.  He takes slow cautious steps with decreased weight bearing on RLE.  He stops frequently during gait for rest and to weight shift.  Weight bearing increases throughout session.  Participated in exercises as described below.   Follow Up Recommendations  Home health PT     Equipment Recommendations  Rolling walker with 5" wheels    Recommendations for Other Services       Precautions / Restrictions Precautions Precautions: Fall Precaution Comments: heparin drip Restrictions Weight Bearing Restrictions: No RLE Weight Bearing: Weight bearing as tolerated    Mobility  Bed Mobility Overal bed mobility: Modified Independent Bed Mobility: Supine to Sit;Sit to Supine     Supine to sit: Supervision Sit to supine: Min guard      Transfers Overall transfer level: Modified independent Equipment used: Rolling walker (2 wheeled) Transfers: Sit to/from Stand Sit to Stand: Supervision         General transfer comment: verbal cues for hand placements  Ambulation/Gait Ambulation/Gait assistance: Min guard Ambulation Distance (Feet): 70 Feet (25) Assistive device: Rolling walker (2 wheeled) Gait Pattern/deviations: Step-to pattern;Decreased stance time - right         Stairs            Wheelchair Mobility    Modified Rankin (Stroke Patients Only)       Balance Overall balance assessment: Needs assistance Sitting-balance support:  Feet supported Sitting balance-Leahy Scale: Normal     Standing balance support: Bilateral upper extremity supported Standing balance-Leahy Scale: Good                      Cognition Arousal/Alertness: Awake/alert Behavior During Therapy: WFL for tasks assessed/performed;Anxious Overall Cognitive Status: Within Functional Limits for tasks assessed                      Exercises General Exercises - Lower Extremity Ankle Circles/Pumps: Strengthening;10 reps Quad Sets: Strengthening;10 reps Heel Slides: AAROM;Right;10 reps;Supine Hip ABduction/ADduction: Strengthening;10 reps Straight Leg Raises: AAROM;Right;10 reps;Supine    General Comments        Pertinent Vitals/Pain Pain Assessment: 0-10 Pain Score: 8  Pain Location: LLE Pain Descriptors / Indicators: Aching Pain Intervention(s): Limited activity within patient's tolerance;Patient requesting pain meds-RN notified    Home Living                      Prior Function            PT Goals (current goals can now be found in the care plan section) Progress towards PT goals: Progressing toward goals    Frequency  7X/week    PT Plan Current plan remains appropriate    Co-evaluation             End of Session Equipment Utilized During Treatment: Gait belt Activity Tolerance: Patient limited by pain;Patient limited by fatigue Patient left: in bed;with call bell/phone within reach;with bed alarm set  Time: 1245-1315 PT Time Calculation (min) (ACUTE ONLY): 30 min  Charges:  $Gait Training: 8-22 mins $Therapeutic Exercise: 8-22 mins                    G Codes:      Danielle DessSarah Rhea Kaelin, PTA 12/27/15, 1:21 PM

## 2015-12-27 NOTE — Progress Notes (Addendum)
ANTICOAGULATION CONSULT NOTE - Initial Consult  Pharmacy Consult for Eliquis, Heparin  Indication: limb ischemia  Allergies  Allergen Reactions  . Hydrocodone Swelling    Patient Measurements: Height: 5\' 7"  (170.2 cm) Weight: 152 lb 12.8 oz (69.3 kg) IBW/kg (Calculated) : 66.1 Heparin Dosing Weight:   Vital Signs: Temp: 98 F (36.7 C) (08/14 0438) Temp Source: Oral (08/14 0438) BP: 142/65 (08/14 0438) Pulse Rate: 83 (08/14 0438)  Labs:  Recent Labs  12/25/15 0521 12/26/15 0555 12/26/15 1414 12/26/15 2003 12/27/15 0434  HGB 7.8* 7.3*  --   --  7.4*  HCT 21.7* 19.9*  --   --  21.0*  PLT 424 371  --   --  439  HEPARINUNFRC 0.40 0.20* 0.36 0.35 0.34  CREATININE 0.61 0.73  --   --   --     Estimated Creatinine Clearance: 91.8 mL/min (by C-G formula based on SCr of 0.8 mg/dL).   Medical History: Past Medical History:  Diagnosis Date  . Collagen vascular disease (HCC)   . Hyperlipidemia   . Hypertension   . Peripheral vascular disease (HCC)     Medications:  Scheduled:  . [START ON 12/28/2015] apixaban  5 mg Oral BID  . aspirin EC  81 mg Oral Daily  . docusate sodium  100 mg Oral BID  . famotidine  20 mg Oral BID  . gabapentin  300 mg Oral QHS  . iron polysaccharides  150 mg Oral Daily  . multivitamin with minerals  1 tablet Oral QPM  . omega-3 acid ethyl esters  1 g Oral Daily  . rosuvastatin  20 mg Oral Daily  . sodium chloride flush  3 mL Intravenous Q12H    Assessment: Pt currently on heparin gtt for limb ischemia with orders to begin Eliquis on 8/15 @ 10:00.  No current stop date on heparin gtt.  Per Dr Driscilla Grammesew's note, wants to d/c heparin several hours after giving Eliquis.  Spoke with Dr Wyn Quakerew to clarify timing to d/c heparin gtt.  Dr Wyn Quakerew confirms that he wants several hours overlap (3-4 hrs) between starting Eliquis and d/c'ng heparin.  Goal of Therapy:  Heparin level 0.3-0.7 units/ml   Plan:  Will begin Eliquis on 8/15 @ 10:00.  Will d/c heparin gtt  on 8/15 @ 13:00.   Robbins,Jason D 12/27/2015,2:21 PM   0457 12/28/2015 heparin level therapeutic. Continue current rate. Plan is to discontinue heparin a couple hours after Eliquis started. Will not order any further lab tests to monitor heparin.  Daxx Tiggs A. [place], VermontPharm.D., BCPS Clinical Pharmacist [phone] 12/28/2015

## 2015-12-28 LAB — CBC
HEMATOCRIT: 21.8 % — AB (ref 40.0–52.0)
Hemoglobin: 7.5 g/dL — ABNORMAL LOW (ref 13.0–18.0)
MCH: 31.2 pg (ref 26.0–34.0)
MCHC: 34.7 g/dL (ref 32.0–36.0)
MCV: 90.1 fL (ref 80.0–100.0)
Platelets: 452 10*3/uL — ABNORMAL HIGH (ref 150–440)
RBC: 2.42 MIL/uL — ABNORMAL LOW (ref 4.40–5.90)
RDW: 13.4 % (ref 11.5–14.5)
WBC: 9.5 10*3/uL (ref 3.8–10.6)

## 2015-12-28 LAB — CREATININE, SERUM
Creatinine, Ser: 0.64 mg/dL (ref 0.61–1.24)
GFR calc non Af Amer: >60 mL/min (ref >60)

## 2015-12-28 LAB — HEPARIN LEVEL (UNFRACTIONATED): Heparin Unfractionated: 0.33 IU/mL (ref 0.30–0.70)

## 2015-12-28 NOTE — Progress Notes (Signed)
Physical Therapy Treatment Patient Details Name: Travis SkinnerSammy T Salas MRN: [id number] DOB: 02-01-1956 Today's Date: 12/28/2015    History of Present Illness 60 y/o male presented to ER/hospital secondary to R LE ischemic rest pain; status post R LE revascularization (R popliteal to anterior tibial artery bypass, with additional endarterectomy and stent placement), 12/23/15.    PT Comments    Pt agreeable to PT. Reports pain tolerable at rest; increases to 7/10 with ambulation. Nurse notified, pt requesting pain medication. Pt participates in seated/long sit exercises. Pt educated on true elevation, frequency of exercise and activity management for swelling management and optimal healing, as pt has several questions regarding such. Pt educated verbally on managing 1 step to enter home. Pt notes some concerns with managing at home especially as pt will not have 24 hour supervision/assist at home. Discussed safety measures of bedside commode and tub bench for fall prevention/safety in the home. Pt is unwilling to consider short term rehab placement prior to discharging home. Continue PT to progress strength, and tolerance of weightbearing in Right lower extremity to allow improved functional mobility and safety for an optimal discharge to home.   Follow Up Recommendations  Home health PT     Equipment Recommendations  Rolling walker with 5" wheels (BSC, tub bench for safety in the home/fall prevention)    Recommendations for Other Services       Precautions / Restrictions Precautions Precautions: Fall Precaution Comments: heparin drip Restrictions Weight Bearing Restrictions: No RLE Weight Bearing: Weight bearing as tolerated    Mobility  Bed Mobility Overal bed mobility: Modified Independent Bed Mobility: Supine to Sit     Supine to sit: Modified independent (Device/Increase time) (increased time)     General bed mobility comments: Increased time/RLE guarding  Transfers Overall  transfer level: Needs assistance Equipment used: Rolling walker (2 wheeled) Transfers: Sit to/from Stand Sit to Stand: Min guard;Supervision         General transfer comment: cues for safe hand placement/technique  Ambulation/Gait Ambulation/Gait assistance: Min guard Ambulation Distance (Feet): 35 Feet (x2 with seated rest in between) Assistive device: Rolling walker (2 wheeled) Gait Pattern/deviations: Step-to pattern;Decreased step length - left;Decreased weight shift to right;Antalgic (heavy use of BUEs) Gait velocity: slow Gait velocity interpretation: <1.8 ft/sec, indicative of risk for recurrent falls General Gait Details: Difficulty applying weight through RLE; does not attain neutral DF in R ankle with wb'g. Encouraged smaller step R to allow for step to pattern and less stress through R foot with wb'g. Slow cadence (Pt does have option of a w/c at home )   Stairs Stairs:  (only reviewed options for negotiating up 1 STE home)          Wheelchair Mobility    Modified Rankin (Stroke Patients Only)       Balance Overall balance assessment: Needs assistance Sitting-balance support: Feet supported Sitting balance-Leahy Scale: Normal     Standing balance support: Bilateral upper extremity supported Standing balance-Leahy Scale: Fair                      Cognition Arousal/Alertness: Awake/alert Behavior During Therapy: WFL for tasks assessed/performed;Anxious Overall Cognitive Status: Within Functional Limits for tasks assessed                      Exercises General Exercises - Lower Extremity Ankle Circles/Pumps: AROM;Both;20 reps (long sit) Quad Sets: Strengthening;Both;20 reps (long sit) Gluteal Sets: Strengthening;Both;20 reps (long sit) Long Arc Quad: AROM;Right;10 reps;Seated (  2 sets) Heel Slides: AROM;Right;20 reps (long sit) Hip ABduction/ADduction: AROM;Right;20 reps (long sit) Hip Flexion/Marching: AROM;Both;20 reps;Seated Toe  Raises: AROM;Right;10 reps;Seated (2 sets)    General Comments        Pertinent Vitals/Pain Pain Assessment: 0-10 Pain Score: 7  (With ambulation) Pain Location: distal RLE Pain Descriptors / Indicators: Throbbing;Tender;Tightness;Pressure Pain Intervention(s): Limited activity within patient's tolerance;Monitored during session;Patient requesting pain meds-RN notified;Repositioned    Home Living                      Prior Function            PT Goals (current goals can now be found in the care plan section) Progress towards PT goals: Progressing toward goals (slowly)    Frequency  7X/week    PT Plan Current plan remains appropriate    Co-evaluation             End of Session Equipment Utilized During Treatment: Gait belt Activity Tolerance: Patient tolerated treatment well;Patient limited by pain Patient left: in chair;with call bell/phone within reach;with chair alarm set     Time: 1419-1500 PT Time Calculation (min) (ACUTE ONLY): 41 min  Charges:  $Gait Training: 8-22 mins $Therapeutic Exercise: 23-37 mins                    G CodesKristeen Salas:      Travis Salas, PTA 12/28/2015, 3:17 PM

## 2015-12-28 NOTE — Progress Notes (Signed)
[facility]  Daily Progress Note   Subjective  - 5 Days Post-Op  Feels a little bit better.  Still some pain. Doing well with PT  Objective Vitals:   12/27/15 1427 12/27/15 2107 12/28/15 0500 12/28/15 0510  BP: 127/67 (!) 146/74  132/77  Pulse: 78 81  81  Resp: 16 18  18   Temp: 98.2 F (36.8 C) 98.3 F (36.8 C)  98.3 F (36.8 C)  TempSrc: Oral Oral  Oral  SpO2: 100% 100%  100%  Weight:   70.2 kg (154 lb 11.2 oz)   Height:        Intake/Output Summary (Last 24 hours) at 12/28/15 0851 Last data filed at 12/28/15 0800  Gross per 24 hour  Intake           1141.5 ml  Output             1400 ml  Net           -258.5 ml    PULM  CTAB CV  RRR VASC  2-3+ swelling, palpable DP pulse  Laboratory CBC    Component Value Date/Time   WBC 9.5 12/28/2015 0457   HGB 7.5 (L) 12/28/2015 0457   HCT 21.8 (L) 12/28/2015 0457   PLT 452 (H) 12/28/2015 0457    BMET    Component Value Date/Time   NA 138 12/26/2015 0555   K 3.6 12/26/2015 0555   CL 105 12/26/2015 0555   CO2 27 12/26/2015 0555   GLUCOSE 107 (H) 12/26/2015 0555   BUN 7 12/26/2015 0555   CREATININE 0.64 12/28/2015 0457   CALCIUM 8.4 (L) 12/26/2015 0555   GFRNONAA >60 12/28/2015 0457   GFRAA >60 12/28/2015 0457    Assessment/Planning: POD #5 s/p extensive RLE revascularization   Overall doing pretty well  Switch from heparin to eliquis  Hgb stable, on NuIron  Continue PT  Likely discharge in 1-2 days    DEW,JASON  12/28/2015, 8:51 AM

## 2015-12-29 MED ORDER — OXYCODONE HCL 5 MG PO TABS: 5.0000 mg | ORAL_TABLET | ORAL | 0 refills | Status: DC | PRN

## 2015-12-29 MED ORDER — GABAPENTIN 300 MG PO CAPS: 300.0000 mg | ORAL_CAPSULE | Freq: Every day | ORAL | 1 refills | Status: DC

## 2015-12-29 MED ORDER — APIXABAN 5 MG PO TABS: 5.0000 mg | ORAL_TABLET | Freq: Two times a day (BID) | ORAL | 3 refills | Status: DC

## 2015-12-29 NOTE — Care Management (Addendum)
Patient lives at home with wife.  PCP Dewaine Oatsenny Tate. Pharmacy [facility]. Crutches at home.  Home health orders for PT and RW.  Agency preference list provided.  [facility].  Melissa with [facility] notified of referral.  Walker delivered to room prior to discharge.  Eliquis coupon given. RNCM signing off

## 2015-12-29 NOTE — Discharge Summary (Signed)
[facility]    Discharge Summary    Patient ID:  Travis SkinnerSammy T Andes MRN: [id number] DOB/AGE: Sep 13, 1955 60 y.o.  Admit date: 12/21/2015 Discharge date: 12/29/2015 Date of Surgery: 12/21/2015 - 12/23/2015 Surgeon: Surgeon(s): Annice NeedyJason S Ponce Skillman, MD Renford DillsGregory G Schnier, MD  Admission Diagnosis: ASO with rest pain rt leg ASO W/ REST PAIN ischemic leg  Discharge Diagnoses:  ASO with rest pain rt leg ASO W/ REST PAIN ischemic leg  Secondary Diagnoses: Past Medical History:  Diagnosis Date  . Collagen vascular disease (HCC)   . Hyperlipidemia   . Hypertension   . Peripheral vascular disease (HCC)     Procedure(s): BYPASS GRAFT FEMORAL-TIBIAL ARTERY, COMMON FEMORAL AND PROFUNDIS FEMORAL WITH PATCH ANGIOPLASTY, POPLITEAL ANTERIOR ARTERY BYPASS, ILIOFEMORAL ANGIOGRAM VIABHAM STENT 8 MM DIAMETER X 5 CM LENGTH COMMON, FEMORAL, SUPERFICIAL, AND PROFUNDA ENDARTERECTOMY  Discharged Condition: good  HPI:  Patient admitted with an ischemic leg s/p multiple previous interventions.  Here for surgical bypass  Hospital Course:  Travis Salas is a 60 y.o. male is S/P Right Procedure(s): BYPASS GRAFT FEMORAL-TIBIAL ARTERY, COMMON FEMORAL AND PROFUNDIS FEMORAL WITH PATCH ANGIOPLASTY, POPLITEAL ANTERIOR ARTERY BYPASS, ILIOFEMORAL ANGIOGRAM VIABHAM STENT 8 MM DIAMETER X 5 CM LENGTH COMMON, FEMORAL, SUPERFICIAL, AND PROFUNDA ENDARTERECTOMY Extubated: POD # 0 Physical exam: foot warm, good pedal pulses, swelling moderate Post-op wounds clean, dry, intact or healing well Pt. Ambulating, voiding and taking PO diet without difficulty. Pt pain controlled with PO pain meds. Labs as below Complications:none  Consults:    Significant Diagnostic Studies: CBC Lab Results  Component Value Date   WBC 9.5 12/28/2015   HGB 7.5 (L) 12/28/2015   HCT 21.8 (L) 12/28/2015   MCV 90.1 12/28/2015   PLT 452 (H) 12/28/2015    BMET    Component Value Date/Time   NA 138 12/26/2015  0555   K 3.6 12/26/2015 0555   CL 105 12/26/2015 0555   CO2 27 12/26/2015 0555   GLUCOSE 107 (H) 12/26/2015 0555   BUN 7 12/26/2015 0555   CREATININE 0.64 12/28/2015 0457   CALCIUM 8.4 (L) 12/26/2015 0555   GFRNONAA >60 12/28/2015 0457   GFRAA >60 12/28/2015 0457   COAG Lab Results  Component Value Date   INR 1.24 12/21/2015     Disposition:  Discharge to :Home Discharge Instructions    Discharge patient    Complete by:  As directed       Medication List    TAKE these medications   amLODipine-benazepril 5-20 MG capsule Commonly known as:  LOTREL Take 1 capsule by mouth 2 (two) times daily.   apixaban 5 MG Tabs tablet Commonly known as:  ELIQUIS Take 1 tablet (5 mg total) by mouth 2 (two) times daily.   aspirin 81 MG EC tablet Take 1 tablet (81 mg total) by mouth daily.   Fish Oil 1200 MG Caps Take 2,400 mg by mouth 2 (two) times daily.   gabapentin 300 MG capsule Commonly known as:  NEURONTIN Take 1 capsule (300 mg total) by mouth at bedtime.   multivitamin with minerals Tabs tablet Take 1 tablet by mouth every evening.   ONE-A-DAY PROSTATE PO Take 1 tablet by mouth daily. "Super Beta Prostate"   oxyCODONE 5 MG immediate release tablet Commonly known as:  Oxy IR/ROXICODONE Take 1 tablet (5 mg total) by mouth every 4 (four) hours as needed for moderate pain. What changed:  how much to take  how to take this  when to take  this  reasons to take this  additional instructions   rosuvastatin 20 MG tablet Commonly known as:  CRESTOR Take 20 mg by mouth daily.      Verbal and written Discharge instructions given to the patient. Wound care per Discharge AVS Follow-up Information    KIMBERLY A STEGMAYER, PA-C Follow up in 2 week(s).   Specialty:  Physician Assistant Why:  staple removal Contact information: [address] [place] [address] [phone]           Signed: Festus BarrenEW,Juelz Claar, MD  12/29/2015, 1:39 PM

## 2015-12-29 NOTE — Progress Notes (Signed)
Patient discharged to home. Concerns addressed. IV site removed. presriptions given and discharge summary given. walker delivered to room.

## 2016-04-04 ENCOUNTER — Other Ambulatory Visit (INDEPENDENT_AMBULATORY_CARE_PROVIDER_SITE_OTHER): Payer: Self-pay | Admitting: Vascular Surgery

## 2016-04-11 ENCOUNTER — Ambulatory Visit (INDEPENDENT_AMBULATORY_CARE_PROVIDER_SITE_OTHER): Payer: Self-pay | Admitting: Vascular Surgery

## 2016-04-11 ENCOUNTER — Encounter (INDEPENDENT_AMBULATORY_CARE_PROVIDER_SITE_OTHER): Payer: BLUE CROSS/BLUE SHIELD

## 2016-04-26 ENCOUNTER — Other Ambulatory Visit (INDEPENDENT_AMBULATORY_CARE_PROVIDER_SITE_OTHER): Payer: Self-pay | Admitting: Vascular Surgery

## 2016-05-17 ENCOUNTER — Telehealth (INDEPENDENT_AMBULATORY_CARE_PROVIDER_SITE_OTHER): Payer: Self-pay

## 2016-05-17 NOTE — Telephone Encounter (Signed)
A message was left by Cala BradfordKimberly regarding the patient having swelling and weeping from his leg. I returned a call to the patient asking about his swelling and drainage, per the patient he started taking Lasix from his PCP and later the next day the weeping started on the leg. He does state that it isn't as bad today and he has an ultrasound and f/u appt scheduled for 05/31/16, I did ask if  the front office could possibly get him in to be seen sooner and they were working on that. I let the patient know all of this as well as let him know that if the weeping got worse to give us a call back.

## 2016-05-31 ENCOUNTER — Ambulatory Visit (INDEPENDENT_AMBULATORY_CARE_PROVIDER_SITE_OTHER): Payer: Self-pay | Admitting: Vascular Surgery

## 2016-05-31 ENCOUNTER — Encounter (INDEPENDENT_AMBULATORY_CARE_PROVIDER_SITE_OTHER): Payer: BLUE CROSS/BLUE SHIELD

## 2016-06-13 ENCOUNTER — Ambulatory Visit (INDEPENDENT_AMBULATORY_CARE_PROVIDER_SITE_OTHER): Payer: BLUE CROSS/BLUE SHIELD | Admitting: Vascular Surgery

## 2016-06-13 ENCOUNTER — Other Ambulatory Visit (INDEPENDENT_AMBULATORY_CARE_PROVIDER_SITE_OTHER): Payer: Self-pay | Admitting: Vascular Surgery

## 2016-06-13 ENCOUNTER — Ambulatory Visit (INDEPENDENT_AMBULATORY_CARE_PROVIDER_SITE_OTHER): Payer: BLUE CROSS/BLUE SHIELD

## 2016-06-13 DIAGNOSIS — I70219 Atherosclerosis of native arteries of extremities with intermittent claudication, unspecified extremity: Secondary | ICD-10-CM | POA: Insufficient documentation

## 2016-06-13 DIAGNOSIS — E785 Hyperlipidemia, unspecified: Secondary | ICD-10-CM | POA: Diagnosis not present

## 2016-06-13 DIAGNOSIS — Z48812 Encounter for surgical aftercare following surgery on the circulatory system: Secondary | ICD-10-CM

## 2016-06-13 DIAGNOSIS — I70213 Atherosclerosis of native arteries of extremities with intermittent claudication, bilateral legs: Secondary | ICD-10-CM

## 2016-06-13 DIAGNOSIS — M7989 Other specified soft tissue disorders: Secondary | ICD-10-CM | POA: Diagnosis not present

## 2016-06-13 DIAGNOSIS — I70221 Atherosclerosis of native arteries of extremities with rest pain, right leg: Secondary | ICD-10-CM

## 2016-06-13 MED ORDER — CLOPIDOGREL BISULFATE 75 MG PO TABS: 75.0000 mg | ORAL_TABLET | Freq: Every day | ORAL | 11 refills | Status: DC

## 2016-06-13 NOTE — Assessment & Plan Note (Signed)
lipid control important in reducing the progression of atherosclerotic disease. Continue statin therapy  

## 2016-06-13 NOTE — Assessment & Plan Note (Signed)
His postsurgical swelling has never really resolved despite the fact that his surgery was about 6 months ago. His mobility is still not up to normal, and that is certainly playing a role. He may have developed some lymphedema from scarring and lymphatic channels. I'm asked him to try a compression stocking, leg elevation, and increased activity. The lymphedema pump could be considered as adjuvant therapy if his symptoms do not improve.

## 2016-06-13 NOTE — Assessment & Plan Note (Signed)
The patient is now about 6 months out from extensive surgical bypass of the right lower extremity for severe peripheral arterial disease. He still has some claudication symptoms in his left leg perfusion is about the same. He no longer has rest pain. His mobility is increasing. He is now 6 months out from surgery, so we will take him off of the full anticoagulation and start him on aspirin and Plavix as well as statin agent. He remains abstinent from tobacco. His noninvasive studies today demonstrate a right ABI of 0.89 with a right digital pressure of 102 and a normal duplex of his bypasses of the right lower extremity. His left ABI remains reduced at 0.54 but is reasonably stable.

## 2016-06-13 NOTE — Progress Notes (Signed)
MRN : [id number]  Travis SkinnerSammy T Salas is a 61 y.o. ([birth year]) male who presents with chief complaint of  Chief Complaint  Patient presents with  . Re-evaluation    Ultrasound follow up  .  History of Present Illness: Patient returns today in follow up of  Peripheral arterial disease. He is doing reasonably well about 6 months after extensive right lower extremity bypass including a femoral to tibioperoneal trunk and then a popliteal to anterior tibial bypass. He has had a prolonged recovery and is still bothered by significant swelling. He does not have ulceration or infection. He has been on full anticoagulation since surgery. His noninvasive studies today demonstrate a right ABI of 0.89 with a right digital pressure of 102 and a normal duplex of his bypasses of the right lower extremity. His left ABI remains reduced at 0.54 but is reasonably stable.  Current Outpatient Prescriptions  Medication Sig Dispense Refill  . amLODipine-benazepril (LOTREL) 5-20 MG capsule Take 1 capsule by mouth 2 (two) times daily.   5  . apixaban (ELIQUIS) 5 MG TABS tablet Take 1 tablet (5 mg total) by mouth 2 (two) times daily. 60 tablet 3  . aspirin EC 81 MG EC tablet Take 1 tablet (81 mg total) by mouth daily.    [name]. gabapentin (NEURONTIN) 300 MG capsule TAKE 1 CAPSULE BY MOUTH AT BEDTIME. 60 capsule 0  . Multiple Vitamin (MULTIVITAMIN WITH MINERALS) TABS tablet Take 1 tablet by mouth every evening.    . Omega-3 Fatty Acids (FISH OIL) 1200 MG CAPS Take 2,400 mg by mouth 2 (two) times daily.    [name]. oxyCODONE (OXY IR/ROXICODONE) 5 MG immediate release tablet Take 1 tablet (5 mg total) by mouth every 4 (four) hours as needed for moderate pain. 30 tablet 0  . rosuvastatin (CRESTOR) 20 MG tablet Take 20 mg by mouth daily.    [name]. Specialty Vitamins Products (ONE-A-DAY PROSTATE PO) Take 1 tablet by mouth daily. "Super Beta Prostate"     No current facility-administered medications for this visit.     Past Medical  History:  Diagnosis Date  . Collagen vascular disease (HCC)   . Hyperlipidemia   . Hypertension   . Peripheral vascular disease [facility](HCC)     Past Surgical History:  Procedure Laterality Date  . ENDARTERECTOMY FEMORAL Right 12/23/2015   Procedure: COMMON, FEMORAL, SUPERFICIAL, AND PROFUNDA ENDARTERECTOMY;  Surgeon: Annice NeedyJason S Dew, MD;  Location: [facility] ORS;  Service: Vascular;  Laterality: Right;  . FEMORAL-TIBIAL BYPASS GRAFT Right 12/23/2015   Procedure: BYPASS GRAFT FEMORAL-TIBIAL ARTERY, COMMON FEMORAL AND PROFUNDIS FEMORAL WITH PATCH ANGIOPLASTY, POPLITEAL ANTERIOR ARTERY BYPASS, ILIOFEMORAL ANGIOGRAM VIABHAM STENT 8 MM DIAMETER X 5 CM LENGTH;  Surgeon: Annice NeedyJason S Dew, MD;  Location: [facility] ORS;  Service: Vascular;  Laterality: Right;  . PERIPHERAL VASCULAR CATHETERIZATION Right 11/08/2015   Procedure: Lower Extremity Angiography;  Surgeon: Annice NeedyJason S Dew, MD;  Location: [facility] IN[facility] CV LAB;  Service: Cardiovascular;  Laterality: Right;  . PERIPHERAL VASCULAR CATHETERIZATION Right 12/13/2015   Procedure: Lower Extremity Angiography;  Surgeon: Annice NeedyJason S Dew, MD;  Location: [facility] INVASIVE CV LAB;  Service: Cardiovascular;  Laterality: Right;    Social History Social History  Substance Use Topics  . Smoking status: Former Smoker    Packs/day: 1.50    Years: 46.00    Types: Cigarettes    Quit date: 02/05/2015  . Smokeless tobacco: Never Used  . Alcohol use 4.2 oz/week    7 Cans of beer per week  Family History No bleeding or clotting disorders  Allergies  Allergen Reactions  . Hydrocodone Swelling     REVIEW OF SYSTEMS (Negative unless checked)  Constitutional: [] Weight loss  [] Fever  [] Chills Cardiac: [] Chest pain   [] Chest pressure   [] Palpitations   [] Shortness of breath when laying flat   [] Shortness of breath at rest   [] Shortness of breath with exertion. Vascular:  [x] Pain in legs with walking   [] Pain in legs at rest   [] Pain in legs when laying flat   [x] Claudication   [] Pain in feet  when walking  [] Pain in feet at rest  [] Pain in feet when laying flat   [] History of DVT   [] Phlebitis   [x] Swelling in legs   [] Varicose veins   [] Non-healing ulcers Pulmonary:   [] Uses home oxygen   [] Productive cough   [] Hemoptysis   [] Wheeze  [] COPD   [] Asthma Neurologic:  [] Dizziness  [] Blackouts   [] Seizures   [] History of stroke   [] History of TIA  [] Aphasia   [] Temporary blindness   [] Dysphagia   [] Weakness or numbness in arms   [x] Weakness or numbness in legs Musculoskeletal:  [] Arthritis   [] Joint swelling   [] Joint pain   [] Low back pain Hematologic:  [] Easy bruising  [] Easy bleeding   [] Hypercoagulable state   [] Anemic   Gastrointestinal:  [] Blood in stool   [] Vomiting blood  [] Gastroesophageal reflux/heartburn   [] Abdominal pain Genitourinary:  [] Chronic kidney disease   [] Difficult urination  [] Frequent urination  [] Burning with urination   [] Hematuria Skin:  [] Rashes   [] Ulcers   [] Wounds Psychological:  [] History of anxiety   []  History of major depression.  Physical Examination  There were no vitals taken for this visit. Gen:  WD/WN, NAD Head: [place]/AT, No temporalis wasting. Ear/Nose/Throat: Hearing grossly intact, nares w/o erythema or drainage, trachea midline Eyes: Conjunctiva clear. Sclera non-icteric Neck: Supple.  No JVD.  Pulmonary:  Good air movement, no use of accessory muscles.  Cardiac: RRR, normal S1, S2 Vascular:  Vessel Right Left  Radial Palpable Palpable  Ulnar Palpable Palpable  Brachial Palpable Palpable  Carotid Palpable, without bruit Palpable, without bruit  Aorta Not palpable N/A  Femoral Palpable Palpable  Popliteal Not Palpable Not Palpable  PT Not Palpable Trace Palpable  DP 2+ Palpable Not Palpable   Gastrointestinal: soft, non-tender/non-distended. No guarding/reflex.  Musculoskeletal: M/S 5/5 throughout.  No deformity or atrophy. 2+ right lower extremity edema. Neurologic: Sensation grossly intact in extremities.  Symmetrical.  Speech is  fluent.  Psychiatric: Judgment intact, Mood & affect appropriate for pt's clinical situation. Dermatologic: No rashes or ulcers noted.  No cellulitis or open wounds. Lymph : No Cervical, Axillary, or Inguinal lymphadenopathy.      Labs No results found for this or any previous visit (from the past 2160 hour(s)).  Radiology No results found.    Assessment/Plan  Hyperlipidemia lipid control important in reducing the progression of atherosclerotic disease. Continue statin therapy   Swelling of limb His postsurgical swelling has never really resolved despite the fact that his surgery was about 6 months ago. His mobility is still not up to normal, and that is certainly playing a role. He may have developed some lymphedema from scarring and lymphatic channels. I'm asked him to try a compression stocking, leg elevation, and increased activity. The lymphedema pump could be considered as adjuvant therapy if his symptoms do not improve.  Atherosclerosis of native arteries of extremity with intermittent claudication [facility]) The patient is now about 6 months  out from extensive surgical bypass of the right lower extremity for severe peripheral arterial disease. He still has some claudication symptoms in his left leg perfusion is about the same. He no longer has rest pain. His mobility is increasing. He is now 6 months out from surgery, so we will take him off of the full anticoagulation and start him on aspirin and Plavix as well as statin agent. He remains abstinent from tobacco. His noninvasive studies today demonstrate a right ABI of 0.89 with a right digital pressure of 102 and a normal duplex of his bypasses of the right lower extremity. His left ABI remains reduced at 0.54 but is reasonably stable.    Festus Barren, MD  06/13/2016 3:17 PM    This note was created with Dragon medical transcription system.  Any errors from dictation are purely unintentional

## 2016-06-28 ENCOUNTER — Encounter (INDEPENDENT_AMBULATORY_CARE_PROVIDER_SITE_OTHER): Payer: BLUE CROSS/BLUE SHIELD

## 2016-06-28 ENCOUNTER — Other Ambulatory Visit (INDEPENDENT_AMBULATORY_CARE_PROVIDER_SITE_OTHER): Payer: Self-pay | Admitting: Vascular Surgery

## 2016-06-28 ENCOUNTER — Ambulatory Visit (INDEPENDENT_AMBULATORY_CARE_PROVIDER_SITE_OTHER): Payer: BLUE CROSS/BLUE SHIELD | Admitting: Vascular Surgery

## 2016-09-10 ENCOUNTER — Other Ambulatory Visit (INDEPENDENT_AMBULATORY_CARE_PROVIDER_SITE_OTHER): Payer: Self-pay | Admitting: Vascular Surgery

## 2016-11-04 ENCOUNTER — Other Ambulatory Visit (INDEPENDENT_AMBULATORY_CARE_PROVIDER_SITE_OTHER): Payer: Self-pay | Admitting: Vascular Surgery

## 2016-11-06 NOTE — Telephone Encounter (Signed)
That is fine.   Thank you

## 2016-12-19 ENCOUNTER — Encounter (INDEPENDENT_AMBULATORY_CARE_PROVIDER_SITE_OTHER): Payer: Self-pay | Admitting: Vascular Surgery

## 2016-12-19 ENCOUNTER — Ambulatory Visit (INDEPENDENT_AMBULATORY_CARE_PROVIDER_SITE_OTHER): Payer: BLUE CROSS/BLUE SHIELD

## 2016-12-19 ENCOUNTER — Ambulatory Visit (INDEPENDENT_AMBULATORY_CARE_PROVIDER_SITE_OTHER): Payer: BLUE CROSS/BLUE SHIELD | Admitting: Vascular Surgery

## 2016-12-19 VITALS — BP 135/79 | HR 83 | Resp 16 | Ht 69.5 in | Wt 169.0 lb

## 2016-12-19 DIAGNOSIS — I10 Essential (primary) hypertension: Secondary | ICD-10-CM | POA: Diagnosis not present

## 2016-12-19 DIAGNOSIS — E785 Hyperlipidemia, unspecified: Secondary | ICD-10-CM

## 2016-12-19 DIAGNOSIS — I70213 Atherosclerosis of native arteries of extremities with intermittent claudication, bilateral legs: Secondary | ICD-10-CM

## 2016-12-19 NOTE — Progress Notes (Signed)
MRN : [id number]  Travis Salas is a 61 y.o. (07-Jun-1955) male who presents with chief complaint of  Chief Complaint  Patient presents with  . Follow-up    ABI u/s follow up  .  History of Present Illness: Patient returns today in follow up of PAD.  He is almost one year s/p extensive RLE bypass surgery for limb threatening ischemia. He has persistent swelling and some numbness in his foot, but overall is much better than he was year ago. A walk much more and his wife reports he is much more active. He has quit smoking and is again congratulated on this. His ABIs today are 1.0 on the right and his left ABIs actually improved and up to 0.67. He has some elevated velocities in the common femoral arteries which is the proximal bypass anastomosis on the right and the native common femoral artery on the left, but neither of these appear particularly high-grade. He has no new ulceration, infection, or ischemic rest pain.  Current Outpatient Prescriptions  Medication Sig Dispense Refill  . amLODipine-benazepril (LOTREL) 5-20 MG capsule Take 1 capsule by mouth 2 (two) times daily.   5  . aspirin EC 81 MG EC tablet Take 1 tablet (81 mg total) by mouth daily.    . clopidogrel (PLAVIX) 75 MG tablet Take 1 tablet (75 mg total) by mouth daily. 30 tablet 11  . furosemide (LASIX) 20 MG tablet TK 1 T PO QD PRN.  2  . gabapentin (NEURONTIN) 300 MG capsule TAKE 1 CAPSULE BY MOUTH AT BEDTIME 60 capsule 0  . Multiple Vitamin (MULTIVITAMIN WITH MINERALS) TABS tablet Take 1 tablet by mouth every evening.    . Omega-3 Fatty Acids (FISH OIL) 1200 MG CAPS Take 2,400 mg by mouth 2 (two) times daily.    [name]. oxyCODONE (OXY IR/ROXICODONE) 5 MG immediate release tablet Take 1 tablet (5 mg total) by mouth every 4 (four) hours as needed for moderate pain. 30 tablet 0  . rosuvastatin (CRESTOR) 20 MG tablet Take 20 mg by mouth daily.    [name]. Specialty Vitamins Products (ONE-A-DAY PROSTATE PO) Take 1 tablet by mouth daily.  "Super Beta Prostate"     No current facility-administered medications for this visit.     Past Medical History:  Diagnosis Date  . Collagen vascular disease (HCC)   . Hyperlipidemia   . Hypertension   . Peripheral vascular disease [facility](HCC)     Past Surgical History:  Procedure Laterality Date  . ENDARTERECTOMY FEMORAL Right 12/23/2015   Procedure: COMMON, FEMORAL, SUPERFICIAL, AND PROFUNDA ENDARTERECTOMY;  Surgeon: Annice NeedyJason S Rasa Degrazia, MD;  Location: [facility] ORS;  Service: Vascular;  Laterality: Right;  . FEMORAL-TIBIAL BYPASS GRAFT Right 12/23/2015   Procedure: BYPASS GRAFT FEMORAL-TIBIAL ARTERY, COMMON FEMORAL AND PROFUNDIS FEMORAL WITH PATCH ANGIOPLASTY, POPLITEAL ANTERIOR ARTERY BYPASS, ILIOFEMORAL ANGIOGRAM VIABHAM STENT 8 MM DIAMETER X 5 CM LENGTH;  Surgeon: Annice NeedyJason S Tytus Strahle, MD;  Location: [facility];  Service: Vascular;  Laterality: Right;  . PERIPHERAL VASCULAR CATHETERIZATION Right 11/08/2015   Procedure: Lower Extremity Angiography;  Surgeon: Annice NeedyJason S Torrin Frein, MD;  Location: [facility];  Service: Cardiovascular;  Laterality: Right;  . PERIPHERAL VASCULAR CATHETERIZATION Right 12/13/2015   Procedure: Lower Extremity Angiography;  Surgeon: Annice NeedyJason S Osaze Hubbert, MD;  Location: [facility];  Service: Cardiovascular;  Laterality: Right;   Social History       Social History  Substance Use Topics  . Smoking status: Former Smoker    Packs/day: 1.50  Years: 46.00    Types: Cigarettes    Quit date: 02/05/2015  . Smokeless tobacco: Never Used  . Alcohol use 4.2 oz/week     7 Cans of beer per week     Family History No bleeding or clotting disorders      Allergies  Allergen Reactions  . Hydrocodone Swelling     REVIEW OF SYSTEMS (Negative unless checked)  Constitutional: [] Weight loss  [] Fever  [] Chills Cardiac: [] Chest pain   [] Chest pressure   [] Palpitations   [] Shortness of breath when laying flat   [] Shortness of breath at rest   [] Shortness of breath with  exertion. Vascular:  [x] Pain in legs with walking   [] Pain in legs at rest   [] Pain in legs when laying flat   [x] Claudication   [] Pain in feet when walking  [] Pain in feet at rest  [] Pain in feet when laying flat   [] History of DVT   [] Phlebitis   [x] Swelling in legs   [] Varicose veins   [] Non-healing ulcers Pulmonary:   [] Uses home oxygen   [] Productive cough   [] Hemoptysis   [] Wheeze  [] COPD   [] Asthma Neurologic:  [] Dizziness  [] Blackouts   [] Seizures   [] History of stroke   [] History of TIA  [] Aphasia   [] Temporary blindness   [] Dysphagia   [] Weakness or numbness in arms   [x] Weakness or numbness in legs Musculoskeletal:  [] Arthritis   [] Joint swelling   [] Joint pain   [] Low back pain Hematologic:  [] Easy bruising  [] Easy bleeding   [] Hypercoagulable state   [] Anemic   Gastrointestinal:  [] Blood in stool   [] Vomiting blood  [] Gastroesophageal reflux/heartburn   [] Abdominal pain Genitourinary:  [] Chronic kidney disease   [] Difficult urination  [] Frequent urination  [] Burning with urination   [] Hematuria Skin:  [] Rashes   [] Ulcers   [] Wounds Psychological:  [] History of anxiety   []  History of major depression.    Physical Examination  BP 135/79 (BP Location: Right Arm)   Pulse 83   Resp 16   Ht 5' 9.5" (1.765 m)   Wt 76.7 kg (169 lb)   BMI 24.60 kg/m  Gen:  WD/WN, NAD Head: [place]/AT, No temporalis wasting. Ear/Nose/Throat: Hearing grossly intact, nares w/o erythema or drainage, trachea midline Eyes: Conjunctiva clear. Sclera non-icteric Neck: Supple.  No JVD.  Pulmonary:  Good air movement, no use of accessory muscles.  Cardiac: RRR, normal S1, S2 Vascular:  Vessel Right Left  Radial Palpable Palpable                      Popliteal Not Palpable 1+ Palpable  PT 1+ Palpable 1+ Palpable  DP Trace Palpable 1+ Palpable    Musculoskeletal: M/S 5/5 throughout.  No deformity or atrophy. 2+ RLE edema. Neurologic: Sensation grossly intact in extremities.  Symmetrical.  Speech is  fluent.  Psychiatric: Judgment intact, Mood & affect appropriate for pt's clinical situation. Dermatologic: No rashes or ulcers noted.  No cellulitis or open wounds.       Labs Recent Results (from the past 2160 hour(s))  VAS [place] LOWER EXTREMITY ARTERIAL DUPLEX     Status: None (In process)   Collection Time: 12/19/16  2:57 PM  Result Value Ref Range   Left super femoral prox sys PSV 115 cm/s   Left super femoral mid sys PSV -218 cm/s   Left super femoral dist sys PSV -148 cm/s   Left popliteal prox sys PSV 59 cm/s   Left ant tibial distal sys  31 cm/s   left ant tibial distal dia 10 cm/s   left post tibial dist sys 18 cm/s   left post tibial dist dia 6 cm/s   LEFT PERO DIST DIA -7 cm/s   LEFT PERO DIST SYS -51.00 cm/s   LEFT SFA PROX DYS VEL 22 cm/s   LEFT SFA MID VEL -36 cm/s   LEFT SFA DIST DYS VEL -26 cm/s   LEFT POPLITEAL PROX DYS VEL 15 cm/s    Radiology No results found.    Assessment/Plan  Hypertension blood pressure control important in reducing the progression of atherosclerotic disease. On appropriate oral medications.   Hyperlipidemia lipid control important in reducing the progression of atherosclerotic disease. Continue statin therapy   Atherosclerosis of native arteries of extremity with intermittent claudication (HCC) His ABIs today are 1.0 on the right and his left ABIs actually improved and up to 0.67. He has some elevated velocities in the common femoral arteries which is the proximal bypass anastomosis on the right and the native common femoral artery on the left, but neither of these appear particularly high-grade. Elevation, compression stockings, and continued increase activity were all discussed to help with the swelling. No intervention is currently necessary. With his high risk bypass and severe disease, we will continue to follow him on 6 month intervals Continue current medications.    Festus Barren, MD  12/20/2016 10:48 AM    This note  was created with Dragon medical transcription system.  Any errors from dictation are purely unintentional

## 2016-12-20 DIAGNOSIS — I10 Essential (primary) hypertension: Secondary | ICD-10-CM | POA: Insufficient documentation

## 2016-12-20 NOTE — Assessment & Plan Note (Signed)
lipid control important in reducing the progression of atherosclerotic disease. Continue statin therapy  

## 2016-12-20 NOTE — Assessment & Plan Note (Signed)
His ABIs today are 1.0 on the right and his left ABIs actually improved and up to 0.67. He has some elevated velocities in the common femoral arteries which is the proximal bypass anastomosis on the right and the native common femoral artery on the left, but neither of these appear particularly high-grade. Elevation, compression stockings, and continued increase activity were all discussed to help with the swelling. No intervention is currently necessary. With his high risk bypass and severe disease, we will continue to follow him on 6 month intervals Continue current medications.

## 2016-12-20 NOTE — Patient Instructions (Signed)

## 2016-12-20 NOTE — Assessment & Plan Note (Signed)
blood pressure control important in reducing the progression of atherosclerotic disease. On appropriate oral medications.  

## 2017-01-14 ENCOUNTER — Other Ambulatory Visit (INDEPENDENT_AMBULATORY_CARE_PROVIDER_SITE_OTHER): Payer: Self-pay | Admitting: Vascular Surgery

## 2017-02-07 ENCOUNTER — Other Ambulatory Visit (INDEPENDENT_AMBULATORY_CARE_PROVIDER_SITE_OTHER): Payer: Self-pay | Admitting: Vascular Surgery

## 2017-03-08 ENCOUNTER — Other Ambulatory Visit (INDEPENDENT_AMBULATORY_CARE_PROVIDER_SITE_OTHER): Payer: Self-pay | Admitting: Vascular Surgery

## 2017-06-22 ENCOUNTER — Ambulatory Visit (INDEPENDENT_AMBULATORY_CARE_PROVIDER_SITE_OTHER): Payer: BLUE CROSS/BLUE SHIELD

## 2017-06-22 ENCOUNTER — Ambulatory Visit (INDEPENDENT_AMBULATORY_CARE_PROVIDER_SITE_OTHER): Payer: BLUE CROSS/BLUE SHIELD | Admitting: Vascular Surgery

## 2017-06-22 ENCOUNTER — Encounter (INDEPENDENT_AMBULATORY_CARE_PROVIDER_SITE_OTHER): Payer: Self-pay | Admitting: Vascular Surgery

## 2017-06-22 VITALS — BP 111/74 | HR 62 | Resp 17 | Wt 160.0 lb

## 2017-06-22 DIAGNOSIS — E785 Hyperlipidemia, unspecified: Secondary | ICD-10-CM | POA: Diagnosis not present

## 2017-06-22 DIAGNOSIS — I70213 Atherosclerosis of native arteries of extremities with intermittent claudication, bilateral legs: Secondary | ICD-10-CM

## 2017-06-22 DIAGNOSIS — I10 Essential (primary) hypertension: Secondary | ICD-10-CM

## 2017-06-22 NOTE — Patient Instructions (Signed)

## 2017-06-22 NOTE — Progress Notes (Signed)
MRN : [id number]  Travis Salas is a 62 y.o. (1955/07/12) male who presents with chief complaint of  Chief Complaint  Patient presents with  . Follow-up    6 month ABI  .  History of Present Illness: Patient returns today in follow up of PAD.  His right leg is much better than it has been in the past.  He had quite a prolonged recovery after a very extensive and complex right lower extremity bypass about a year and a half ago.  He has moderate distance claudication in the left leg currently.  He does not have ischemic rest pain.  He does not have ulceration.  His ABIs today are 1.0 on the right and 0.45 on the left.  Duplex shows a patent bypass graft on the right without hemodynamically significant stenosis and likely a left popliteal occlusion as well as some tibial disease.  Current Outpatient Prescriptions  Medication Sig Dispense Refill  . amLODipine-benazepril (LOTREL) 5-20 MG capsule Take 1 capsule by mouth 2 (two) times daily.   5  . aspirin EC 81 MG EC tablet Take 1 tablet (81 mg total) by mouth daily.    . clopidogrel (PLAVIX) 75 MG tablet Take 1 tablet (75 mg total) by mouth daily. 30 tablet 11  . furosemide (LASIX) 20 MG tablet TK 1 T PO QD PRN.  2  . gabapentin (NEURONTIN) 300 MG capsule TAKE 1 CAPSULE BY MOUTH AT BEDTIME 60 capsule 0  . Multiple Vitamin (MULTIVITAMIN WITH MINERALS) TABS tablet Take 1 tablet by mouth every evening.    . Omega-3 Fatty Acids (FISH OIL) 1200 MG CAPS Take 2,400 mg by mouth 2 (two) times daily.    [name] oxyCODONE (OXY IR/ROXICODONE) 5 MG immediate release tablet Take 1 tablet (5 mg total) by mouth every 4 (four) hours as needed for moderate pain. 30 tablet 0  . rosuvastatin (CRESTOR) 20 MG tablet Take 20 mg by mouth daily.    [name] Specialty Vitamins Products (ONE-A-DAY PROSTATE PO) Take 1 tablet by mouth daily. "Super Beta Prostate"     No current facility-administered medications for this visit.         Past Medical History:    Diagnosis Date  . Collagen vascular disease (HCC)   . Hyperlipidemia   . Hypertension   . Peripheral vascular disease [facility])          Past Surgical History:  Procedure Laterality Date  . ENDARTERECTOMY FEMORAL Right 12/23/2015   Procedure: COMMON, FEMORAL, SUPERFICIAL, AND PROFUNDA ENDARTERECTOMY;  Surgeon: Annice Needy, MD;  Location: [facility] ORS;  Service: Vascular;  Laterality: Right;  . FEMORAL-TIBIAL BYPASS GRAFT Right 12/23/2015   Procedure: BYPASS GRAFT FEMORAL-TIBIAL ARTERY, COMMON FEMORAL AND PROFUNDIS FEMORAL WITH PATCH ANGIOPLASTY, POPLITEAL ANTERIOR ARTERY BYPASS, ILIOFEMORAL ANGIOGRAM VIABHAM STENT 8 MM DIAMETER X 5 CM LENGTH;  Surgeon: Annice Needy, MD;  Location: [facility] ORS;  Service: Vascular;  Laterality: Right;  . PERIPHERAL VASCULAR CATHETERIZATION Right 11/08/2015   Procedure: Lower Extremity Angiography;  Surgeon: Annice Needy, MD;  Location: [facility] INVASIVE CV LAB;  Service: Cardiovascular;  Laterality: Right;  . PERIPHERAL VASCULAR CATHETERIZATION Right 12/13/2015   Procedure: Lower Extremity Angiography;  Surgeon: Annice Needy, MD;  Location: [facility] INVASIVE CV LAB;  Service: Cardiovascular;  Laterality: Right;   Social History       Social History  Substance Use Topics  . Smoking status: Former Smoker    Packs/day: 1.50    Years: 46.00    Types:  Cigarettes    Quit date: 02/05/2015  . Smokeless tobacco: Never Used  . Alcohol use 4.2 oz/week     7 Cans of beer per week     Family History No bleeding or clotting disorders      Allergies  Allergen Reactions  . Hydrocodone Swelling     REVIEW OF SYSTEMS(Negative unless checked)  Constitutional: [] Weight loss[] Fever[] Chills Cardiac:[] Chest pain[] Chest pressure[] Palpitations [] Shortness of breath when laying flat [] Shortness of breath at rest [] Shortness of breath with exertion. Vascular: [x] Pain in legs with walking[] Pain in legsat rest[] Pain in legs when  laying flat [x] Claudication [] Pain in feet when walking [] Pain in feet at rest [] Pain in feet when laying flat [] History of DVT [] Phlebitis [x] Swelling in legs [] Varicose veins [] Non-healing ulcers Pulmonary: [] Uses home oxygen [] Productive cough[] Hemoptysis [] Wheeze [] COPD [] Asthma Neurologic: [] Dizziness [] Blackouts [] Seizures [] History of stroke [] History of TIA[] Aphasia [] Temporary blindness[] Dysphagia [] Weaknessor numbness in arms [x] Weakness or numbnessin legs Musculoskeletal: [] Arthritis [] Joint swelling [] Joint pain [] Low back pain Hematologic:[] Easy bruising[] Easy bleeding [] Hypercoagulable state [] Anemic  Gastrointestinal:[] Blood in stool[] Vomiting blood[] Gastroesophageal reflux/heartburn[] Abdominal pain Genitourinary: [] Chronic kidney disease [] Difficulturination [] Frequenturination [] Burning with urination[] Hematuria Skin: [] Rashes [] Ulcers [] Wounds Psychological: [] History of anxiety[] History of major depression.       Physical Examination  BP 111/74 (BP Location: Right Arm)   Pulse 62   Resp 17   Wt 72.6 kg (160 lb)   BMI 23.29 kg/m  Gen:  WD/WN, NAD Head: [place]/AT, No temporalis wasting. Ear/Nose/Throat: Hearing grossly intact, nares w/o erythema or drainage, trachea midline Eyes: Conjunctiva clear. Sclera non-icteric Neck: Supple.  No JVD.  Pulmonary:  Good air movement, no use of accessory muscles.  Cardiac: RRR, normal S1, S2 Vascular:  Vessel Right Left  Radial Palpable Palpable                      Popliteal Not Palpable Not Palpable  PT Palpable 1+ Palpable  DP Trace Palpable Not Palpable    Musculoskeletal: M/S 5/5 throughout.  No deformity or atrophy. Mild RLE edema. Neurologic: Sensation grossly intact in extremities.  Symmetrical.  Speech is fluent.  Psychiatric: Judgment intact, Mood & affect appropriate for pt's clinical situation. Dermatologic: No  rashes or ulcers noted.  No cellulitis or open wounds.       Labs No results found for this or any previous visit (from the past 2160 hour(s)).  Radiology No results found.    Assessment/Plan Hypertension blood pressure control important in reducing the progression of atherosclerotic disease. On appropriate oral medications.   Hyperlipidemia lipid control important in reducing the progression of atherosclerotic disease. Continue statin therapy    Atherosclerosis of native arteries of extremity with intermittent claudication (HCC) His ABIs today are 1.0 on the right and 0.45 on the left.  Duplex shows a patent bypass graft on the right without hemodynamically significant stenosis and likely a left popliteal occlusion as well as some tibial disease. At current, he does not think his claudication symptoms of the left leg are severe enough to warrant intervention.  I think that is reasonable and he does not have any critical and immediately limb threatening symptoms.  We will plan to recheck him in 6 months or sooner if problems develop in the interim    Festus BarrenJason Jametta Moorehead, MD  06/22/2017 1:28 PM    This note was created with Dragon medical transcription system.  Any errors from dictation are purely unintentional

## 2017-06-22 NOTE — Assessment & Plan Note (Signed)
His ABIs today are 1.0 on the right and 0.45 on the left.  Duplex shows a patent bypass graft on the right without hemodynamically significant stenosis and likely a left popliteal occlusion as well as some tibial disease. At current, he does not think his claudication symptoms of the left leg are severe enough to warrant intervention.  I think that is reasonable and he does not have any critical and immediately limb threatening symptoms.  We will plan to recheck him in 6 months or sooner if problems develop in the interim

## 2017-10-19 ENCOUNTER — Other Ambulatory Visit (INDEPENDENT_AMBULATORY_CARE_PROVIDER_SITE_OTHER): Payer: Self-pay | Admitting: Vascular Surgery

## 2017-11-15 ENCOUNTER — Other Ambulatory Visit (INDEPENDENT_AMBULATORY_CARE_PROVIDER_SITE_OTHER): Payer: Self-pay | Admitting: Vascular Surgery

## 2017-12-21 ENCOUNTER — Ambulatory Visit (INDEPENDENT_AMBULATORY_CARE_PROVIDER_SITE_OTHER): Payer: BLUE CROSS/BLUE SHIELD

## 2017-12-21 ENCOUNTER — Ambulatory Visit (INDEPENDENT_AMBULATORY_CARE_PROVIDER_SITE_OTHER): Payer: BLUE CROSS/BLUE SHIELD | Admitting: Nurse Practitioner

## 2017-12-21 ENCOUNTER — Encounter (INDEPENDENT_AMBULATORY_CARE_PROVIDER_SITE_OTHER): Payer: Self-pay | Admitting: Nurse Practitioner

## 2017-12-21 VITALS — BP 145/80 | HR 68 | Resp 13 | Ht 70.0 in | Wt 157.0 lb

## 2017-12-21 DIAGNOSIS — I10 Essential (primary) hypertension: Secondary | ICD-10-CM

## 2017-12-21 DIAGNOSIS — I70213 Atherosclerosis of native arteries of extremities with intermittent claudication, bilateral legs: Secondary | ICD-10-CM

## 2017-12-21 DIAGNOSIS — E785 Hyperlipidemia, unspecified: Secondary | ICD-10-CM

## 2017-12-21 NOTE — Progress Notes (Signed)
Subjective:    Patient ID: Travis Salas, male    DOB: 1956-04-13, 62 y.o.   MRN: [id number] Chief Complaint  Patient presents with  . Follow-up    6 month ABI and Arterial    HPI  Travis Salas returns to the office for followup and review of the noninvasive studies. There have been no interval changes in lower extremity symptoms. No interval shortening of the patient's claudication distance or development of rest pain symptoms. No new ulcers or wounds have occurred since the last visit.  The patient endorses working outside more in his garage.   There have been no significant changes to the patient's overall health care.  The patient denies amaurosis fugax or recent TIA symptoms. There are no recent neurological changes noted. The patient denies history of DVT, PE or superficial thrombophlebitis. The patient denies recent episodes of angina or shortness of breath.   ABI Rt=1.02 and Lt=.62  (previous ABI's Rt=1.05 and Lt=.45) Duplex ultrasound of the right lower extremity reveals no significant change compared to the previous study patent bypass grafts were detected with no significant increases in velocity.  The left lower extremity actually displays some minimal improvement, likely due to new collaterals.  There are multiple stenotic lesions throughout the distal SFA up to a popliteal occlusion.  Constitutional: [] Weight loss  [] Fever  [] Chills Cardiac: [] Chest pain   [] Chest pressure   [] Palpitations   [] Shortness of breath when laying flat   [] Shortness of breath with exertion. Vascular:  [x] Pain in legs with walking   [] Pain in legs with standing  [] History of DVT   [] Phlebitis   [] Swelling in legs   [] Varicose veins   [] Non-healing ulcers Pulmonary:   [] Uses home oxygen   [] Productive cough   [] Hemoptysis   [] Wheeze  [] COPD   [] Asthma Neurologic:  [] Dizziness   [] Seizures   [] History of stroke   [] History of TIA  [] Aphasia   [] Vissual changes   [] Weakness or numbness in arm    [] Weakness or numbness in leg Musculoskeletal:   [] Joint swelling   [] Joint pain   [] Low back pain Hematologic:  [x] Easy bruising  [] Easy bleeding   [] Hypercoagulable state   [] Anemic Gastrointestinal:  [] Diarrhea   [] Vomiting  [] Gastroesophageal reflux/heartburn   [] Difficulty swallowing. Genitourinary:  [] Chronic kidney disease   [] Difficult urination  [] Frequent urination   [] Blood in urine Skin:  [] Rashes   [] Ulcers  Psychological:  [] History of anxiety   []  History of major depression.     Objective:   Physical Exam  BP (!) 145/80 (BP Location: Right Arm, Patient Position: Sitting)   Pulse 68   Resp 13   Ht 5\' 10"  (1.778 m)   Wt 157 lb (71.2 kg)   BMI 22.53 kg/m   Past Medical History:  Diagnosis Date  . Collagen vascular disease (HCC)   . Hyperlipidemia   . Hypertension   . Peripheral vascular disease (HCC)      Gen: WD/WN, NAD, face appears flushed Head: [place]/AT, No temporalis wasting.  Ear/Nose/Throat: Hearing grossly intact, nares w/o erythema or drainage, poor dentition Eyes: PER, EOMI, sclera nonicteric.  Neck: Supple, no masses.  No bruit or JVD.  Pulmonary:  Good air movement, clear to auscultation bilaterally, no use of accessory muscles.  Cardiac: RRR, normal S1, S2, no Murmurs. Vascular:  Vessel Right Left  Radial  2+  2+  Brachial    Femoral    Popliteal  not palpable  not palpable  PT  1+  palpable  DP  faintly palpable  not palpable  Carotid    Gastrointestinal: soft, non-distended. No guarding/no peritoneal signs.  Musculoskeletal: M/S 5/5 throughout.  No deformity or atrophy.  Neurologic: CN 2-12 intact. Pain and light touch intact in extremities.  Symmetrical.  Speech is fluent. Motor exam as listed above. Psychiatric: Judgment intact, Mood & affect appropriate for pt's clinical situation. Dermatologic: no Venous rashes no ulcers noted.  No changes consistent with cellulitis. Lymph : No Cervical lymphadenopathy, no lichenification or skin changes  of chronic lymphedema.   Social History   Socioeconomic History  . Marital status: Married    Spouse name: Not on file  . Number of children: Not on file  . Years of education: Not on file  . Highest education level: Not on file  Occupational History  . Not on file  Social Needs  . Financial resource strain: Not on file  . Food insecurity:    Worry: Not on file    Inability: Not on file  . Transportation needs:    Medical: Not on file    Non-medical: Not on file  Tobacco Use  . Smoking status: Former Smoker    Packs/day: 1.50    Years: 46.00    Pack years: 69.00    Types: Cigarettes    Last attempt to quit: 02/05/2015    Years since quitting: 2.8  . Smokeless tobacco: Never Used  Substance and Sexual Activity  . Alcohol use: Yes    Alcohol/week: 7.0 standard drinks    Types: 7 Cans of beer per week  . Drug use: No  . Sexual activity: Not on file  Lifestyle  . Physical activity:    Days per week: Not on file    Minutes per session: Not on file  . Stress: Not on file  Relationships  . Social connections:    Talks on phone: Not on file    Gets together: Not on file    Attends religious service: Not on file    Active member of club or organization: Not on file    Attends meetings of clubs or organizations: Not on file    Relationship status: Not on file  . Intimate partner violence:    Fear of current or ex partner: Not on file    Emotionally abused: Not on file    Physically abused: Not on file    Forced sexual activity: Not on file  Other Topics Concern  . Not on file  Social History Narrative  . Not on file    Past Surgical History:  Procedure Laterality Date  . ENDARTERECTOMY FEMORAL Right 12/23/2015   Procedure: COMMON, FEMORAL, SUPERFICIAL, AND PROFUNDA ENDARTERECTOMY;  Surgeon: Annice Needy, MD;  Location: [facility] ORS;  Service: Vascular;  Laterality: Right;  . FEMORAL-TIBIAL BYPASS GRAFT Right 12/23/2015   Procedure: BYPASS GRAFT FEMORAL-TIBIAL ARTERY,  COMMON FEMORAL AND PROFUNDIS FEMORAL WITH PATCH ANGIOPLASTY, POPLITEAL ANTERIOR ARTERY BYPASS, ILIOFEMORAL ANGIOGRAM VIABHAM STENT 8 MM DIAMETER X 5 CM LENGTH;  Surgeon: Annice Needy, MD;  Location: [facility] ORS;  Service: Vascular;  Laterality: Right;  . PERIPHERAL VASCULAR CATHETERIZATION Right 11/08/2015   Procedure: Lower Extremity Angiography;  Surgeon: Annice Needy, MD;  Location: [facility] INVASIVE CV LAB;  Service: Cardiovascular;  Laterality: Right;  . PERIPHERAL VASCULAR CATHETERIZATION Right 12/13/2015   Procedure: Lower Extremity Angiography;  Surgeon: Annice Needy, MD;  Location: [facility] INVASIVE CV LAB;  Service: Cardiovascular;  Laterality: Right;    Family History  Problem  Relation Age of Onset  . Diabetes Mother   . Hypertension Brother     Allergies  Allergen Reactions  . Hydrocodone Swelling       Assessment & Plan:    1. Atherosclerosis of native artery of both lower extremities with intermittent claudication (HCC)  Recommend:  The patient has evidence of atherosclerosis of the lower extremities with claudication.  The patient does not voice lifestyle limiting changes at this point in time.  Noninvasive studies do not suggest clinically significant change.  Patient advised that he should follow-up in 6 months for further monitoring.  Patient advised that should he experience claudication with shorter distances, rest pain, or ulcers of the feet/toes, he should call the office to be seen sooner.  No invasive studies, angiography or surgery at this time The patient should continue walking and begin a more formal exercise program.  The patient should continue antiplatelet therapy and aggressive treatment of the lipid abnormalities  No changes in the patient's medications at this time  The patient should continue wearing graduated compression socks 10-15 mmHg strength to control the mild edema.   - VAS [place] LOWER EXTREMITY ARTERIAL DUPLEX; Future - VAS [place] ABI WITH/WO TBI;  Future  2. Essential hypertension Continue antihypertensive medications as already ordered, these medications have been reviewed and there are no changes at this time.   3. Hyperlipidemia, unspecified hyperlipidemia type Continue statin as ordered and reviewed, no changes at this time     Current Outpatient Medications on File Prior to Visit  Medication Sig Dispense Refill  . amLODipine-benazepril (LOTREL) 5-20 MG capsule Take 1 capsule by mouth 2 (two) times daily.   5  . aspirin EC 81 MG EC tablet Take 1 tablet (81 mg total) by mouth daily. (Patient taking differently: Take 81 mg by mouth daily. )    . clopidogrel (PLAVIX) 75 MG tablet TAKE 1 TABLET BY MOUTH EVERY DAY 30 tablet 0  . furosemide (LASIX) 20 MG tablet TK 1 T PO QD PRN.  2  . gabapentin (NEURONTIN) 300 MG capsule TAKE 1 CAPSULE BY MOUTH AT BEDTIME 60 capsule 0  . Multiple Vitamin (MULTIVITAMIN WITH MINERALS) TABS tablet Take 1 tablet by mouth every evening.    . Omega-3 Fatty Acids (FISH OIL) 1200 MG CAPS Take 2,400 mg by mouth 2 (two) times daily.    . rosuvastatin (CRESTOR) 20 MG tablet Take 20 mg by mouth daily.    . sertraline (ZOLOFT) 100 MG tablet TK 1 T PO QD  3  . Specialty Vitamins Products (ONE-A-DAY PROSTATE PO) Take 1 tablet by mouth daily. "Super Beta Prostate"    . oxyCODONE (OXY IR/ROXICODONE) 5 MG immediate release tablet Take 1 tablet (5 mg total) by mouth every 4 (four) hours as needed for moderate pain. (Patient not taking: Reported on 12/21/2017) 30 tablet 0   No current facility-administered medications on file prior to visit.     There are no Patient Instructions on file for this visit. No follow-ups on file.   Georgiana Spinner, NP

## 2017-12-29 ENCOUNTER — Other Ambulatory Visit (INDEPENDENT_AMBULATORY_CARE_PROVIDER_SITE_OTHER): Payer: Self-pay | Admitting: Vascular Surgery

## 2018-01-20 ENCOUNTER — Other Ambulatory Visit (INDEPENDENT_AMBULATORY_CARE_PROVIDER_SITE_OTHER): Payer: Self-pay | Admitting: Vascular Surgery

## 2018-07-02 ENCOUNTER — Encounter (INDEPENDENT_AMBULATORY_CARE_PROVIDER_SITE_OTHER): Payer: Self-pay | Admitting: Vascular Surgery

## 2018-07-02 ENCOUNTER — Ambulatory Visit (INDEPENDENT_AMBULATORY_CARE_PROVIDER_SITE_OTHER): Payer: BLUE CROSS/BLUE SHIELD

## 2018-07-02 ENCOUNTER — Ambulatory Visit (INDEPENDENT_AMBULATORY_CARE_PROVIDER_SITE_OTHER): Payer: BLUE CROSS/BLUE SHIELD | Admitting: Vascular Surgery

## 2018-07-02 VITALS — BP 144/80 | HR 66 | Resp 17 | Ht 70.0 in | Wt 158.0 lb

## 2018-07-02 DIAGNOSIS — Z87891 Personal history of nicotine dependence: Secondary | ICD-10-CM

## 2018-07-02 DIAGNOSIS — I70213 Atherosclerosis of native arteries of extremities with intermittent claudication, bilateral legs: Secondary | ICD-10-CM

## 2018-07-02 DIAGNOSIS — I10 Essential (primary) hypertension: Secondary | ICD-10-CM

## 2018-07-02 DIAGNOSIS — E785 Hyperlipidemia, unspecified: Secondary | ICD-10-CM | POA: Diagnosis not present

## 2018-07-02 NOTE — Progress Notes (Signed)
MRN : [id number]  Travis Salas is a 63 y.o. (April 17, 1956) male who presents with chief complaint of  Chief Complaint  Patient presents with  . Follow-up    76month ultrasound  .  History of Present Illness: Patient returns today in follow up of his PAD.  He is having worsening claudication symptoms in his left leg but no ischemic rest pain or ulceration.  His ABIs today are basically stable at 0.86 on the right and 0.57 on the left.  His right leg bypass is widely patent without recurrent stenosis or occlusion.  Current Outpatient Medications  Medication Sig Dispense Refill  . amLODipine-benazepril (LOTREL) 5-20 MG capsule Take 1 capsule by mouth 2 (two) times daily.   5  . aspirin EC 81 MG EC tablet Take 1 tablet (81 mg total) by mouth daily. (Patient taking differently: Take 81 mg by mouth daily. )    . clopidogrel (PLAVIX) 75 MG tablet TAKE 1 TABLET BY MOUTH EVERY DAY 30 tablet 5  . furosemide (LASIX) 20 MG tablet TK 1 T PO QD PRN.  2  . gabapentin (NEURONTIN) 300 MG capsule TAKE 1 CAPSULE BY MOUTH AT BEDTIME 60 capsule 0  . Multiple Vitamin (MULTIVITAMIN WITH MINERALS) TABS tablet Take 1 tablet by mouth every evening.    . Omega-3 Fatty Acids (FISH OIL) 1200 MG CAPS Take 2,400 mg by mouth 2 (two) times daily.    . rosuvastatin (CRESTOR) 20 MG tablet Take 20 mg by mouth daily.    . sertraline (ZOLOFT) 100 MG tablet TK 1 T PO QD  3  . Specialty Vitamins Products (ONE-A-DAY PROSTATE PO) Take 1 tablet by mouth daily. "Super Beta Prostate"    . oxyCODONE (OXY IR/ROXICODONE) 5 MG immediate release tablet Take 1 tablet (5 mg total) by mouth every 4 (four) hours as needed for moderate pain. (Patient not taking: Reported on 12/21/2017) 30 tablet 0   No current facility-administered medications for this visit.     Past Medical History:  Diagnosis Date  . Collagen vascular disease (HCC)   . Hyperlipidemia   . Hypertension   . Peripheral vascular disease [facility])     Past Surgical  History:  Procedure Laterality Date  . ENDARTERECTOMY FEMORAL Right 12/23/2015   Procedure: COMMON, FEMORAL, SUPERFICIAL, AND PROFUNDA ENDARTERECTOMY;  Surgeon: Annice Needy, MD;  Location: [facility] ORS;  Service: Vascular;  Laterality: Right;  . FEMORAL-TIBIAL BYPASS GRAFT Right 12/23/2015   Procedure: BYPASS GRAFT FEMORAL-TIBIAL ARTERY, COMMON FEMORAL AND PROFUNDIS FEMORAL WITH PATCH ANGIOPLASTY, POPLITEAL ANTERIOR ARTERY BYPASS, ILIOFEMORAL ANGIOGRAM VIABHAM STENT 8 MM DIAMETER X 5 CM LENGTH;  Surgeon: Annice Needy, MD;  Location: [facility] ORS;  Service: Vascular;  Laterality: Right;  . PERIPHERAL VASCULAR CATHETERIZATION Right 11/08/2015   Procedure: Lower Extremity Angiography;  Surgeon: Annice Needy, MD;  Location: [facility] INVASIVE CV LAB;  Service: Cardiovascular;  Laterality: Right;  . PERIPHERAL VASCULAR CATHETERIZATION Right 12/13/2015   Procedure: Lower Extremity Angiography;  Surgeon: Annice Needy, MD;  Location: [facility] INVASIVE CV LAB;  Service: Cardiovascular;  Laterality: Right;   Social History  Substance Use Topics  . Smoking status: Former Smoker    Packs/day: 1.50    Years: 46.00    Types: Cigarettes    Quit date: 02/05/2015  . Smokeless tobacco: Never Used  . Alcohol use 4.2 oz/week     7 Cans of beer per week     Family History No bleeding or clotting disorders  Allergies  Allergen Reactions  . Hydrocodone Swelling     REVIEW OF SYSTEMS(Negative unless checked)  Constitutional: [] ?Weight loss[] ?Fever[] ?Chills Cardiac:[] ?Chest pain[] ?Chest pressure[] ?Palpitations [] ?Shortness of breath when laying flat [] ?Shortness of breath at rest [] ?Shortness of breath with exertion. Vascular: [x] ?Pain in legs with walking[] ?Pain in legsat rest[] ?Pain in legs when laying flat [x] ?Claudication [] ?Pain in feet when walking [] ?Pain in feet at rest [] ?Pain in feet when laying flat [] ?History of DVT [] ?Phlebitis [x] ?Swelling in legs  [] ?Varicose veins [] ?Non-healing ulcers Pulmonary: [] ?Uses home oxygen [] ?Productive cough[] ?Hemoptysis [] ?Wheeze [] ?COPD [] ?Asthma Neurologic: [] ?Dizziness [] ?Blackouts [] ?Seizures [] ?History of stroke [] ?History of TIA[] ?Aphasia [] ?Temporary blindness[] ?Dysphagia [] ?Weaknessor numbness in arms [x] ?Weakness or numbnessin legs Musculoskeletal: [] ?Arthritis [] ?Joint swelling [] ?Joint pain [] ?Low back pain Hematologic:[] ?Easy bruising[] ?Easy bleeding [] ?Hypercoagulable state [] ?Anemic  Gastrointestinal:[] ?Blood in stool[] ?Vomiting blood[] ?Gastroesophageal reflux/heartburn[] ?Abdominal pain Genitourinary: [] ?Chronic kidney disease [] ?Difficulturination [] ?Frequenturination [] ?Burning with urination[] ?Hematuria Skin: [] ?Rashes [] ?Ulcers [] ?Wounds Psychological: [] ?History of anxiety[] ?History of major depression.     Physical Examination  BP (!) 144/80 (BP Location: Right Arm)   Pulse 66   Resp 17   Ht 5\' 10"  (1.778 m)   Wt 158 lb (71.7 kg)   BMI 22.67 kg/m  Gen:  WD/WN, NAD Head: [place]/AT, No temporalis wasting. Ear/Nose/Throat: Hearing grossly intact, nares w/o erythema or drainage Eyes: Conjunctiva clear. Sclera non-icteric Neck: Supple.  Trachea midline Pulmonary:  Good air movement, no use of accessory muscles.  Cardiac: RRR, no JVD Vascular:  Vessel Right Left  Radial Palpable Palpable                          PT Palpable  trace palpable  DP  1+ palpable  1+ palpable    Musculoskeletal: M/S 5/5 throughout.  No deformity or atrophy.  No significant lower extremity edema. Neurologic: Sensation grossly intact in extremities.  Symmetrical.  Speech is fluent.  Psychiatric: Judgment intact, Mood & affect appropriate for pt's clinical situation. Dermatologic: No rashes or ulcers noted.  No cellulitis or open wounds.       Labs No results found for this or any previous visit (from the past  2160 hour(s)).  Radiology No results found.  Assessment/Plan Hypertension blood pressure control important in reducing the progression of atherosclerotic disease. On appropriate oral medications.   Hyperlipidemia lipid control important in reducing the progression of atherosclerotic disease. Continue statin therapy  Atherosclerosis of native arteries of extremity with intermittent claudication (HCC)  His ABIs today are basically stable at 0.86 on the right and 0.57 on the left.  His right leg bypass is widely patent without recurrent stenosis or occlusion.  As long as his left leg claudication symptoms are not lately disabling and he has no limb threatening symptoms, we will continue conservative measures.  Continue current medical regimen.  See back in 6 months.    Festus Barren, MD  07/02/2018 3:34 PM    This note was created with Dragon medical transcription system.  Any errors from dictation are purely unintentional

## 2018-07-02 NOTE — Patient Instructions (Signed)
Peripheral Vascular Disease  Peripheral vascular disease (PVD) is a disease of the blood vessels that are not part of your heart and brain. A simple term for PVD is poor circulation. In most cases, PVD narrows the blood vessels that carry blood from your heart to the rest of your body. This can reduce the supply of blood to your arms, legs, and internal organs, like your stomach or kidneys. However, PVD most often affects a person's lower legs and feet. Without treatment, PVD tends to get worse. PVD can also lead to acute ischemic limb. This is when an arm or leg suddenly cannot get enough blood. This is a medical emergency. Follow these instructions at home: Lifestyle  Do not use any products that contain nicotine or tobacco, such as cigarettes and e-cigarettes. If you need help quitting, ask your doctor.  Lose weight if you are overweight. Or, stay at a healthy weight as told by your doctor.  Eat a diet that is low in fat and cholesterol. If you need help, ask your doctor.  Exercise regularly. Ask your doctor for activities that are right for you. General instructions  Take over-the-counter and prescription medicines only as told by your doctor.  Take good care of your feet: ? Wear comfortable shoes that fit well. ? Check your feet often for any cuts or sores.  Keep all follow-up visits as told by your doctor This is important. Contact a doctor if:  You have cramps in your legs when you walk.  You have leg pain when you are at rest.  You have coldness in a leg or foot.  Your skin changes.  You are unable to get or have an erection (erectile dysfunction).  You have cuts or sores on your feet that do not heal. Get help right away if:  Your arm or leg turns cold, numb, and blue.  Your arms or legs become red, warm, swollen, painful, or numb.  You have chest pain.  You have trouble breathing.  You suddenly have weakness in your face, arm, or leg.  You become very  confused or you cannot speak.  You suddenly have a very bad headache.  You suddenly cannot see. Summary  Peripheral vascular disease (PVD) is a disease of the blood vessels.  A simple term for PVD is poor circulation. Without treatment, PVD tends to get worse.  Treatment may include exercise, low fat and low cholesterol diet, and quitting smoking. This information is not intended to replace advice given to you by your health care provider. Make sure you discuss any questions you have with your health care provider. Document Released: 07/26/2009 Document Revised: 06/08/2016 Document Reviewed: 06/08/2016 Elsevier Interactive Patient Education  2019 [employer]  

## 2018-07-02 NOTE — Assessment & Plan Note (Signed)
His ABIs today are basically stable at 0.86 on the right and 0.57 on the left.  His right leg bypass is widely patent without recurrent stenosis or occlusion.  As long as his left leg claudication symptoms are not lately disabling and he has no limb threatening symptoms, we will continue conservative measures.  Continue current medical regimen.  See back in 6 months.

## 2018-08-07 ENCOUNTER — Other Ambulatory Visit (INDEPENDENT_AMBULATORY_CARE_PROVIDER_SITE_OTHER): Payer: Self-pay | Admitting: Vascular Surgery

## 2018-10-21 DIAGNOSIS — I10 Essential (primary) hypertension: Secondary | ICD-10-CM | POA: Diagnosis not present

## 2018-10-21 DIAGNOSIS — Z125 Encounter for screening for malignant neoplasm of prostate: Secondary | ICD-10-CM | POA: Diagnosis not present

## 2018-10-21 DIAGNOSIS — E785 Hyperlipidemia, unspecified: Secondary | ICD-10-CM | POA: Diagnosis not present

## 2018-10-29 DIAGNOSIS — I10 Essential (primary) hypertension: Secondary | ICD-10-CM | POA: Diagnosis not present

## 2018-10-29 DIAGNOSIS — E785 Hyperlipidemia, unspecified: Secondary | ICD-10-CM | POA: Diagnosis not present

## 2019-01-03 ENCOUNTER — Encounter (INDEPENDENT_AMBULATORY_CARE_PROVIDER_SITE_OTHER): Payer: Self-pay

## 2019-01-03 ENCOUNTER — Ambulatory Visit (INDEPENDENT_AMBULATORY_CARE_PROVIDER_SITE_OTHER): Payer: Self-pay | Admitting: Vascular Surgery

## 2019-01-07 ENCOUNTER — Other Ambulatory Visit: Payer: Self-pay

## 2019-01-07 ENCOUNTER — Encounter (INDEPENDENT_AMBULATORY_CARE_PROVIDER_SITE_OTHER): Payer: Self-pay | Admitting: Vascular Surgery

## 2019-01-07 ENCOUNTER — Ambulatory Visit (INDEPENDENT_AMBULATORY_CARE_PROVIDER_SITE_OTHER): Payer: BC Managed Care – PPO

## 2019-01-07 ENCOUNTER — Ambulatory Visit (INDEPENDENT_AMBULATORY_CARE_PROVIDER_SITE_OTHER): Payer: BC Managed Care – PPO | Admitting: Vascular Surgery

## 2019-01-07 VITALS — BP 125/72 | HR 71 | Resp 16 | Wt 155.8 lb

## 2019-01-07 DIAGNOSIS — I70213 Atherosclerosis of native arteries of extremities with intermittent claudication, bilateral legs: Secondary | ICD-10-CM

## 2019-01-07 DIAGNOSIS — E785 Hyperlipidemia, unspecified: Secondary | ICD-10-CM

## 2019-01-07 DIAGNOSIS — I10 Essential (primary) hypertension: Secondary | ICD-10-CM

## 2019-01-07 NOTE — Assessment & Plan Note (Signed)
His noninvasive studies today show a patent bypass on duplex without obvious focal stenosis.  His ABIs 0.95 on the right and 0.55 on the left which are both stable from previous studies. At this point, we will continue her current medical regimen.  No intervention at this time although if his symptoms worsen he will contact our office and we will consider left lower extremity angiogram.  Recheck in 6 months. 

## 2019-01-07 NOTE — Progress Notes (Signed)
MRN : [id number]  Travis Salas is a 63 y.o. (1955/11/16) male who presents with chief complaint of  Chief Complaint  Patient presents with  . Follow-up    ultrasound follow up  .  History of Present Illness: Patient returns today in follow up of his PAD.  3 years ago he underwent an extensive bypass surgery of the right lower extremity for limb salvage.  He continues to do well with this.  He has some chronic mild swelling in his right leg but that has not changed.  He denies any ulceration or infection.  He continues to have fairly short distance claudication of the left leg that is not really changed from his previous visits.  His noninvasive studies today show a patent bypass on duplex without obvious focal stenosis.  His ABIs 0.95 on the right and 0.55 on the left which are both stable from previous studies.  Current Outpatient Medications  Medication Sig Dispense Refill  . amLODipine-benazepril (LOTREL) 5-20 MG capsule Take 1 capsule by mouth 2 (two) times daily.   5  . clopidogrel (PLAVIX) 75 MG tablet TAKE 1 TABLET BY MOUTH EVERY DAY 30 tablet 5  . furosemide (LASIX) 20 MG tablet TK 1 T PO QD PRN.  2  . gabapentin (NEURONTIN) 300 MG capsule TAKE 1 CAPSULE BY MOUTH AT BEDTIME 60 capsule 0  . Omega-3 Fatty Acids (FISH OIL) 1200 MG CAPS Take 2,400 mg by mouth 2 (two) times daily.    . rosuvastatin (CRESTOR) 20 MG tablet Take 20 mg by mouth daily.    . sertraline (ZOLOFT) 100 MG tablet TK 1 T PO QD  3  . Zn-Pyg Afri-Nettle-Saw Palmet (SAW PALMETTO COMPLEX PO) Take 2 tablets by mouth daily.    [name] aspirin EC 81 MG EC tablet Take 1 tablet (81 mg total) by mouth daily. (Patient not taking: Reported on 01/07/2019)    . Multiple Vitamin (MULTIVITAMIN WITH MINERALS) TABS tablet Take 1 tablet by mouth every evening.    [name] oxyCODONE (OXY IR/ROXICODONE) 5 MG immediate release tablet Take 1 tablet (5 mg total) by mouth every 4 (four) hours as needed for moderate pain. (Patient not taking: Reported  on 12/21/2017) 30 tablet 0  . Specialty Vitamins Products (ONE-A-DAY PROSTATE PO) Take 1 tablet by mouth daily. "Super Beta Prostate"     No current facility-administered medications for this visit.     Past Medical History:  Diagnosis Date  . Collagen vascular disease ([place])   . Hyperlipidemia   . Hypertension   . Peripheral vascular disease [facility])     Past Surgical History:  Procedure Laterality Date  . ENDARTERECTOMY FEMORAL Right 12/23/2015   Procedure: COMMON, FEMORAL, SUPERFICIAL, AND PROFUNDA ENDARTERECTOMY;  Surgeon: Algernon Huxley, MD;  Location: [facility] ORS;  Service: Vascular;  Laterality: Right;  . FEMORAL-TIBIAL BYPASS GRAFT Right 12/23/2015   Procedure: BYPASS GRAFT FEMORAL-TIBIAL ARTERY, COMMON FEMORAL AND PROFUNDIS FEMORAL WITH PATCH ANGIOPLASTY, POPLITEAL ANTERIOR ARTERY BYPASS, ILIOFEMORAL ANGIOGRAM VIABHAM STENT 8 MM DIAMETER X 5 CM LENGTH;  Surgeon: Algernon Huxley, MD;  Location: [facility] ORS;  Service: Vascular;  Laterality: Right;  . PERIPHERAL VASCULAR CATHETERIZATION Right 11/08/2015   Procedure: Lower Extremity Angiography;  Surgeon: Algernon Huxley, MD;  Location: [place] CV LAB;  Service: Cardiovascular;  Laterality: Right;  . PERIPHERAL VASCULAR CATHETERIZATION Right 12/13/2015   Procedure: Lower Extremity Angiography;  Surgeon: Algernon Huxley, MD;  Location: [place] CV LAB;  Service: Cardiovascular;  Laterality: Right;  Social History Social History   Tobacco Use  . Smoking status: Former Smoker    Packs/day: 1.50    Years: 46.00    Pack years: 69.00    Types: Cigarettes    Quit date: 02/05/2015    Years since quitting: 3.9  . Smokeless tobacco: Never Used  Substance Use Topics  . Alcohol use: Yes    Alcohol/week: 7.0 standard drinks    Types: 7 Cans of beer per week  . Drug use: No    Family History Family History  Problem Relation Age of Onset  . Diabetes Mother   . Hypertension Brother   no bleeding or clotting disorders Brother with PAD   Allergies  Allergen Reactions  . Hydrocodone Swelling    REVIEW OF SYSTEMS(Negative unless checked)  Constitutional: [] ??Weight loss[] ??Fever[] ??Chills Cardiac:[] ??Chest pain[] ??Chest pressure[] ??Palpitations [] ??Shortness of breath when laying flat [] ??Shortness of breath at rest [] ??Shortness of breath with exertion. Vascular: [x] ??Pain in legs with walking[] ??Pain in legsat rest[] ??Pain in legs when laying flat [x] ??Claudication [] ??Pain in feet when walking [] ??Pain in feet at rest [] ??Pain in feet when laying flat [] ??History of DVT [] ??Phlebitis [x] ??Swelling in legs [] ??Varicose veins [] ??Non-healing ulcers Pulmonary: [] ??Uses home oxygen [] ??Productive cough[] ??Hemoptysis [] ??Wheeze [] ??COPD [] ??Asthma Neurologic: [] ??Dizziness [] ??Blackouts [] ??Seizures [] ??History of stroke [] ??History of TIA[] ??Aphasia [] ??Temporary blindness[] ??Dysphagia [] ??Weaknessor numbness in arms [x] ??Weakness or numbnessin legs Musculoskeletal: [] ??Arthritis [] ??Joint swelling [] ??Joint pain [] ??Low back pain Hematologic:[] ??Easy bruising[] ??Easy bleeding [] ??Hypercoagulable state [] ??Anemic  Gastrointestinal:[] ??Blood in stool[] ??Vomiting blood[] ??Gastroesophageal reflux/heartburn[] ??Abdominal pain Genitourinary: [] ??Chronic kidney disease [] ??Difficulturination [] ??Frequenturination [] ??Burning with urination[] ??Hematuria Skin: [] ??Rashes [] ??Ulcers [] ??Wounds Psychological: [] ??History of anxiety[] ??History of major depression.  Physical Examination  BP 125/72 (BP Location: Right Arm)   Pulse 71   Resp 16   Wt 155 lb 12.8 oz (70.7 kg)   BMI 22.35 kg/m  Gen:  WD/WN, NAD Head: [place]/AT, No temporalis wasting. Ear/Nose/Throat: Hearing grossly intact, nares w/o erythema or drainage Eyes: Conjunctiva clear. Sclera non-icteric Neck: Supple.  Trachea midline Pulmonary:  Good air  movement, no use of accessory muscles.  Cardiac: RRR, no JVD Vascular:  Vessel Right Left  Radial Palpable Palpable                          PT 1+ Palpable Not Palpable  DP 1+ Palpable 1+ Palpable   Gastrointestinal: soft, non-tender/non-distended. No guarding/reflex.  Musculoskeletal: M/S 5/5 throughout.  No deformity or atrophy. 1+ RLE edema. Neurologic: Sensation grossly intact in extremities.  Symmetrical.  Speech is fluent.  Psychiatric: Judgment intact, Mood & affect appropriate for pt's clinical situation. Dermatologic: No rashes or ulcers noted.  No cellulitis or open wounds.       Labs No results found for this or any previous visit (from the past 2160 hour(s)).  Radiology No results found.  Assessment/Plan Hypertension blood pressure control important in reducing the progression of atherosclerotic disease. On appropriate oral medications.   Hyperlipidemia lipid control important in reducing the progression of atherosclerotic disease. Continue statin therapy  Atherosclerosis of native arteries of extremity with intermittent claudication (HCC) His noninvasive studies today show a patent bypass on duplex without obvious focal stenosis.  His ABIs 0.95 on the right and 0.55 on the left which are both stable from previous studies. At this point, we will continue her current medical regimen.  No intervention at this time although if his symptoms worsen he will contact our office and we will consider left lower extremity angiogram.  Recheck in 6 months.    Festus Barren, MD  01/07/2019  4:09 PM    This note was created with Dragon medical transcription system.  Any errors from dictation are purely unintentional

## 2019-02-02 ENCOUNTER — Other Ambulatory Visit (INDEPENDENT_AMBULATORY_CARE_PROVIDER_SITE_OTHER): Payer: Self-pay | Admitting: Vascular Surgery

## 2019-02-24 DIAGNOSIS — Z23 Encounter for immunization: Secondary | ICD-10-CM | POA: Diagnosis not present

## 2019-03-17 DIAGNOSIS — E785 Hyperlipidemia, unspecified: Secondary | ICD-10-CM | POA: Diagnosis not present

## 2019-03-17 DIAGNOSIS — I10 Essential (primary) hypertension: Secondary | ICD-10-CM | POA: Diagnosis not present

## 2019-03-25 DIAGNOSIS — R790 Abnormal level of blood mineral: Secondary | ICD-10-CM | POA: Diagnosis not present

## 2019-04-02 DIAGNOSIS — R972 Elevated prostate specific antigen [PSA]: Secondary | ICD-10-CM | POA: Diagnosis not present

## 2019-04-03 ENCOUNTER — Other Ambulatory Visit: Payer: Self-pay | Admitting: Urology

## 2019-04-03 DIAGNOSIS — R972 Elevated prostate specific antigen [PSA]: Secondary | ICD-10-CM

## 2019-04-17 DIAGNOSIS — N401 Enlarged prostate with lower urinary tract symptoms: Secondary | ICD-10-CM | POA: Diagnosis not present

## 2019-04-21 ENCOUNTER — Other Ambulatory Visit: Payer: Self-pay

## 2019-04-21 ENCOUNTER — Ambulatory Visit
Admission: RE | Admit: 2019-04-21 | Discharge: 2019-04-21 | Disposition: A | Discharge status: Home / Self Care | Payer: BC Managed Care – PPO | Source: Ambulatory Visit | Attending: Urology | Admitting: Urology

## 2019-04-21 DIAGNOSIS — R972 Elevated prostate specific antigen [PSA]: Secondary | ICD-10-CM | POA: Diagnosis not present

## 2019-04-21 LAB — POCT I-STAT CREATININE: Creatinine, Ser: 1.1 mg/dL (ref 0.61–1.24)

## 2019-04-21 MED ORDER — GADOBUTROL 1 MMOL/ML IV SOLN
7.0000 mL | Freq: Once | INTRAVENOUS | Status: AC | PRN
  Administered 2019-04-21: 10:00:00 7 mL via INTRAVENOUS

## 2019-04-23 DIAGNOSIS — N401 Enlarged prostate with lower urinary tract symptoms: Secondary | ICD-10-CM | POA: Diagnosis not present

## 2019-06-18 ENCOUNTER — Encounter (INDEPENDENT_AMBULATORY_CARE_PROVIDER_SITE_OTHER): Payer: Self-pay | Admitting: Nurse Practitioner

## 2019-06-18 ENCOUNTER — Encounter (INDEPENDENT_AMBULATORY_CARE_PROVIDER_SITE_OTHER): Payer: Self-pay

## 2019-06-18 ENCOUNTER — Other Ambulatory Visit: Payer: Self-pay

## 2019-06-18 ENCOUNTER — Telehealth (INDEPENDENT_AMBULATORY_CARE_PROVIDER_SITE_OTHER): Payer: Self-pay

## 2019-06-18 ENCOUNTER — Ambulatory Visit (INDEPENDENT_AMBULATORY_CARE_PROVIDER_SITE_OTHER): Payer: BC Managed Care – PPO

## 2019-06-18 ENCOUNTER — Ambulatory Visit (INDEPENDENT_AMBULATORY_CARE_PROVIDER_SITE_OTHER): Payer: BC Managed Care – PPO | Admitting: Nurse Practitioner

## 2019-06-18 VITALS — BP 144/78 | HR 75 | Resp 16 | Wt 157.0 lb

## 2019-06-18 DIAGNOSIS — I998 Other disorder of circulatory system: Secondary | ICD-10-CM

## 2019-06-18 DIAGNOSIS — I10 Essential (primary) hypertension: Secondary | ICD-10-CM | POA: Diagnosis not present

## 2019-06-18 DIAGNOSIS — I70213 Atherosclerosis of native arteries of extremities with intermittent claudication, bilateral legs: Secondary | ICD-10-CM

## 2019-06-18 DIAGNOSIS — E785 Hyperlipidemia, unspecified: Secondary | ICD-10-CM | POA: Diagnosis not present

## 2019-06-18 NOTE — Telephone Encounter (Signed)
Bring him in for ABIs, optimally today

## 2019-06-18 NOTE — Progress Notes (Signed)
SUBJECTIVE:  Patient ID: Travis Salas, male    DOB: Dec 14, 1955, 64 y.o.   MRN: [id number] Chief Complaint  Patient presents with  . Follow-up    rle cold and painful    HPI  Travis Salas is a 64 y.o. male that presents today after having sudden pain and coldness of his right lower extremity that happened last night.  The patient states that he was in his usual state of health while he was watching TV with his wife and noticed that his leg suddenly began to hurt and feel very cold to the touch.  His wife also felt his leg and felt that it was ice cold.  The patient is well-known to our practice and has had multiple vascular interventions.  He had a right femoral endarterectomy on 12/23/2015 in addition to a femoral-tibial bypass graft also on the right lower extremity.  He currently denies any fever, chills, nausea vomiting or diarrhea.  He denies any chest pain or shortness of breath.  He denies any TIA-like symptoms.  The patient has also abstained from tobacco use.  Today the patient's right ABI is 0 as well as a TBI of 0.  The left ABI 0.46 with a TBI of 0.16.  The previous study done on 01/07/2019 had an ABI 0.95 with a TBI 0.78.  The left lower extremity had an ABI of 0.55 with a TBI 0.35.  Limited duplex shows no pulsatile flow seen within the right posterior tibial artery, anterior tibial artery and peroneal artery.  The right lower extremity bypass graft appears to be occluded.  The right common femoral artery is patent with monophasic flow.  Additional imaging shows that the patient has some collateralization with faint monophasic flow in his posterior tibial artery.  Past Medical History:  Diagnosis Date  . Collagen vascular disease (HCC)   . Hyperlipidemia   . Hypertension   . Peripheral vascular disease [facility])     Past Surgical History:  Procedure Laterality Date  . ENDARTERECTOMY FEMORAL Right 12/23/2015   Procedure: COMMON, FEMORAL, SUPERFICIAL, AND PROFUNDA  ENDARTERECTOMY;  Surgeon: Annice Needy, MD;  Location: [facility] ORS;  Service: Vascular;  Laterality: Right;  . FEMORAL-TIBIAL BYPASS GRAFT Right 12/23/2015   Procedure: BYPASS GRAFT FEMORAL-TIBIAL ARTERY, COMMON FEMORAL AND PROFUNDIS FEMORAL WITH PATCH ANGIOPLASTY, POPLITEAL ANTERIOR ARTERY BYPASS, ILIOFEMORAL ANGIOGRAM VIABHAM STENT 8 MM DIAMETER X 5 CM LENGTH;  Surgeon: Annice Needy, MD;  Location: [facility] ORS;  Service: Vascular;  Laterality: Right;  . PERIPHERAL VASCULAR CATHETERIZATION Right 11/08/2015   Procedure: Lower Extremity Angiography;  Surgeon: Annice Needy, MD;  Location: [facility] INVASIVE CV LAB;  Service: Cardiovascular;  Laterality: Right;  . PERIPHERAL VASCULAR CATHETERIZATION Right 12/13/2015   Procedure: Lower Extremity Angiography;  Surgeon: Annice Needy, MD;  Location: [facility] INVASIVE CV LAB;  Service: Cardiovascular;  Laterality: Right;    Social History   Socioeconomic History  . Marital status: Married    Spouse name: Not on file  . Number of children: Not on file  . Years of education: Not on file  . Highest education level: Not on file  Occupational History  . Not on file  Tobacco Use  . Smoking status: Former Smoker    Packs/day: 1.50    Years: 46.00    Pack years: 69.00    Types: Cigarettes    Quit date: 02/05/2015    Years since quitting: 4.3  . Smokeless tobacco: Never Used  Substance and Sexual Activity  .  Alcohol use: Yes    Alcohol/week: 7.0 standard drinks    Types: 7 Cans of beer per week  . Drug use: No  . Sexual activity: Not on file  Other Topics Concern  . Not on file  Social History Narrative  . Not on file   Social Determinants of Health   Financial Resource Strain:   . Difficulty of Paying Living Expenses: Not on file  Food Insecurity:   . Worried About Programme researcher, broadcasting/film/video in the Last Year: Not on file  . Ran Out of Food in the Last Year: Not on file  Transportation Needs:   . Lack of Transportation (Medical): Not on file  . Lack of  Transportation (Non-Medical): Not on file  Physical Activity:   . Days of Exercise per Week: Not on file  . Minutes of Exercise per Session: Not on file  Stress:   . Feeling of Stress : Not on file  Social Connections:   . Frequency of Communication with Friends and Family: Not on file  . Frequency of Social Gatherings with Friends and Family: Not on file  . Attends Religious Services: Not on file  . Active Member of Clubs or Organizations: Not on file  . Attends Banker Meetings: Not on file  . Marital Status: Not on file  Intimate Partner Violence:   . Fear of Current or Ex-Partner: Not on file  . Emotionally Abused: Not on file  . Physically Abused: Not on file  . Sexually Abused: Not on file    Family History  Problem Relation Age of Onset  . Diabetes Mother   . Hypertension Brother     Allergies  Allergen Reactions  . Hydrocodone Swelling     Review of Systems   Review of Systems: Negative Unless Checked Constitutional: [] Weight loss  [] Fever  [] Chills Cardiac: [] Chest pain   []  Atrial Fibrillation  [] Palpitations   [] Shortness of breath when laying flat   [] Shortness of breath with exertion. [] Shortness of breath at rest Vascular:  [x] Pain in legs with walking   [x] Pain in legs with standing [x] Pain in legs when laying flat   [] Claudication    [] Pain in feet when laying flat    [] History of DVT   [] Phlebitis   [x] Swelling in legs   [] Varicose veins   [] Non-healing ulcers Pulmonary:   [] Uses home oxygen   [] Productive cough   [] Hemoptysis   [] Wheeze  [] COPD   [] Asthma Neurologic:  [] Dizziness   [] Seizures  [] Blackouts [] History of stroke   [] History of TIA  [] Aphasia   [] Temporary Blindness   [] Weakness or numbness in arm   [] Weakness or numbness in leg Musculoskeletal:   [] Joint swelling   [] Joint pain   [] Low back pain  []  History of Knee Replacement [] Arthritis [] back Surgeries  []  Spinal Stenosis    Hematologic:  [] Easy bruising  [] Easy bleeding    [] Hypercoagulable state   [] Anemic Gastrointestinal:  [] Diarrhea   [] Vomiting  [] Gastroesophageal reflux/heartburn   [] Difficulty swallowing. [] Abdominal pain Genitourinary:  [] Chronic kidney disease   [] Difficult urination  [] Anuric   [] Blood in urine [] Frequent urination  [] Burning with urination   [] Hematuria Skin:  [] Rashes   [] Ulcers [] Wounds Psychological:  [] History of anxiety   []  History of major depression  []  Memory Difficulties      OBJECTIVE:   Physical Exam  BP (!) 144/78 (BP Location: Right Arm)   Pulse 75   Resp 16   Wt 157 lb (71.2 kg)  BMI 22.53 kg/m   Gen: WD/WN, NAD Head: [place]/AT, No temporalis wasting.  Ear/Nose/Throat: Hearing grossly intact, nares w/o erythema or drainage Eyes: PER, EOMI, sclera nonicteric.  Neck: Supple, no masses.  No JVD.  Pulmonary:  Good air movement, no use of accessory muscles.  Cardiac: RRR Vascular: right foot cold Vessel Right Left  Dorsalis Pedis Not Palpable  not Palpable  Posterior Tibial Not Palpable Not Palpable   Gastrointestinal: soft, non-distended. No guarding/no peritoneal signs.  Musculoskeletal: Hard to walk on right leg.  No deformity or atrophy.  Neurologic: Pain and light touch intact in extremities.  Symmetrical.  Speech is fluent. Motor exam as listed above. Psychiatric: Judgment intact, Mood & affect appropriate for pt's clinical situation. Dermatologic: No Venous rashes. No Ulcers Noted.  No changes consistent with cellulitis. Lymph : No Cervical lymphadenopathy, no lichenification or skin changes of chronic lymphedema.       ASSESSMENT AND PLAN:  1. Ischemic leg  Recommend:  The patient has evidence of severe atherosclerotic changes of both lower extremities associated with ulceration and tissue loss of the foot.  This represents a limb threatening ischemia and places the patient at the risk for limb loss.  Patient should undergo angiography of the right ower extremities with the hope for intervention  for limb salvage.  The risks and benefits as well as the alternative therapies was discussed in detail with the patient.  All questions were answered.  Patient agrees to proceed with angiography.  The patient will follow up with me in the office after the procedure.    2. Essential hypertension Blood pressure slightly elevated today however this could also be due to the pain in the patient's right lower extremity.  Patient on appropriate medications no changes made today.  3. Hyperlipidemia, unspecified hyperlipidemia type Good lipid control important factor in slowing atherosclerotic disease progression.  Patient on appropriate medications.  No changes made today.   Current Outpatient Medications on File Prior to Visit  Medication Sig Dispense Refill  . amLODipine-benazepril (LOTREL) 5-20 MG capsule Take 1 capsule by mouth 2 (two) times daily.   5  . clopidogrel (PLAVIX) 75 MG tablet TAKE 1 TABLET BY MOUTH EVERY DAY 30 tablet 5  . furosemide (LASIX) 20 MG tablet TK 1 T PO QD PRN.  2  . gabapentin (NEURONTIN) 300 MG capsule TAKE 1 CAPSULE BY MOUTH AT BEDTIME 60 capsule 0  . Multiple Vitamin (MULTIVITAMIN WITH MINERALS) TABS tablet Take 1 tablet by mouth every evening.    . Omega-3 Fatty Acids (FISH OIL) 1200 MG CAPS Take 2,400 mg by mouth 2 (two) times daily.    . rosuvastatin (CRESTOR) 20 MG tablet Take 20 mg by mouth daily.    . sertraline (ZOLOFT) 100 MG tablet TK 1 T PO QD  3  . Specialty Vitamins Products (ONE-A-DAY PROSTATE PO) Take 1 tablet by mouth daily. "Super Beta Prostate"    . tamsulosin (FLOMAX) 0.4 MG CAPS capsule Take 0.4 mg by mouth daily.    [name] Zn-Pyg Afri-Nettle-Saw Palmet (SAW PALMETTO COMPLEX PO) Take 2 tablets by mouth daily.    [name] aspirin EC 81 MG EC tablet Take 1 tablet (81 mg total) by mouth daily. (Patient not taking: Reported on 01/07/2019)    . oxyCODONE (OXY IR/ROXICODONE) 5 MG immediate release tablet Take 1 tablet (5 mg total) by mouth every 4 (four) hours as  needed for moderate pain. (Patient not taking: Reported on 12/21/2017) 30 tablet 0   No current facility-administered medications  on file prior to visit.    There are no Patient Instructions on file for this visit. No follow-ups on file.   Kris Hartmann, NP  This note was completed with Sales executive.  Any errors are purely unintentional.

## 2019-06-18 NOTE — Telephone Encounter (Signed)
Patient is schedule to come in today @3  with ABI and see NP

## 2019-06-19 ENCOUNTER — Other Ambulatory Visit: Payer: Self-pay

## 2019-06-19 ENCOUNTER — Other Ambulatory Visit
Admission: RE | Admit: 2019-06-19 | Discharge: 2019-06-19 | Disposition: A | Discharge status: Home / Self Care | Payer: BC Managed Care – PPO | Source: Ambulatory Visit | Attending: Vascular Surgery | Admitting: Vascular Surgery

## 2019-06-19 ENCOUNTER — Encounter
Admission: RE | Disposition: A | Discharge status: Home / Self Care | Payer: Self-pay | Source: Ambulatory Visit | Attending: Vascular Surgery

## 2019-06-19 ENCOUNTER — Inpatient Hospital Stay
Admission: RE | Admit: 2019-06-19 | Discharge: 2019-06-21 | DRG: 272 | Disposition: A | Discharge status: Home / Self Care | Payer: BC Managed Care – PPO | Source: Ambulatory Visit | Attending: Vascular Surgery | Admitting: Vascular Surgery

## 2019-06-19 ENCOUNTER — Other Ambulatory Visit (INDEPENDENT_AMBULATORY_CARE_PROVIDER_SITE_OTHER): Payer: Self-pay | Admitting: Vascular Surgery

## 2019-06-19 ENCOUNTER — Encounter: Payer: Self-pay | Admitting: Vascular Surgery

## 2019-06-19 DIAGNOSIS — Z20822 Contact with and (suspected) exposure to covid-19: Secondary | ICD-10-CM | POA: Diagnosis present

## 2019-06-19 DIAGNOSIS — Z8249 Family history of ischemic heart disease and other diseases of the circulatory system: Secondary | ICD-10-CM | POA: Diagnosis not present

## 2019-06-19 DIAGNOSIS — Z7982 Long term (current) use of aspirin: Secondary | ICD-10-CM

## 2019-06-19 DIAGNOSIS — Z87891 Personal history of nicotine dependence: Secondary | ICD-10-CM

## 2019-06-19 DIAGNOSIS — I70221 Atherosclerosis of native arteries of extremities with rest pain, right leg: Secondary | ICD-10-CM | POA: Diagnosis not present

## 2019-06-19 DIAGNOSIS — E785 Hyperlipidemia, unspecified: Secondary | ICD-10-CM | POA: Diagnosis not present

## 2019-06-19 DIAGNOSIS — M79604 Pain in right leg: Secondary | ICD-10-CM | POA: Diagnosis not present

## 2019-06-19 DIAGNOSIS — Z79899 Other long term (current) drug therapy: Secondary | ICD-10-CM | POA: Diagnosis not present

## 2019-06-19 DIAGNOSIS — I70321 Atherosclerosis of unspecified type of bypass graft(s) of the extremities with rest pain, right leg: Principal | ICD-10-CM | POA: Diagnosis present

## 2019-06-19 DIAGNOSIS — I998 Other disorder of circulatory system: Secondary | ICD-10-CM | POA: Diagnosis present

## 2019-06-19 DIAGNOSIS — I10 Essential (primary) hypertension: Secondary | ICD-10-CM | POA: Diagnosis not present

## 2019-06-19 DIAGNOSIS — Z7902 Long term (current) use of antithrombotics/antiplatelets: Secondary | ICD-10-CM | POA: Diagnosis not present

## 2019-06-19 DIAGNOSIS — Z885 Allergy status to narcotic agent status: Secondary | ICD-10-CM

## 2019-06-19 DIAGNOSIS — Z833 Family history of diabetes mellitus: Secondary | ICD-10-CM

## 2019-06-19 DIAGNOSIS — I70201 Unspecified atherosclerosis of native arteries of extremities, right leg: Secondary | ICD-10-CM | POA: Diagnosis present

## 2019-06-19 HISTORY — PX: LOWER EXTREMITY ANGIOGRAPHY: CATH118251

## 2019-06-19 LAB — CBC
HCT: 37.7 % — ABNORMAL LOW (ref 39.0–52.0)
HCT: 34.7 % — ABNORMAL LOW (ref 39.0–52.0)
Hemoglobin: 12.7 g/dL — ABNORMAL LOW (ref 13.0–17.0)
Hemoglobin: 11.4 g/dL — ABNORMAL LOW (ref 13.0–17.0)
MCH: 31.2 pg (ref 26.0–34.0)
MCH: 30.5 pg (ref 26.0–34.0)
MCHC: 33.7 g/dL (ref 30.0–36.0)
MCHC: 32.9 g/dL (ref 30.0–36.0)
MCV: 92.6 fL (ref 80.0–100.0)
MCV: 92.8 fL (ref 80.0–100.0)
Platelets: 145 10*3/uL — ABNORMAL LOW (ref 150–400)
Platelets: 131 10*3/uL — ABNORMAL LOW (ref 150–400)
RBC: 4.07 MIL/uL — ABNORMAL LOW (ref 4.22–5.81)
RBC: 3.74 MIL/uL — ABNORMAL LOW (ref 4.22–5.81)
RDW: 13.3 % (ref 11.5–15.5)
RDW: 13.3 % (ref 11.5–15.5)
WBC: 11.1 10*3/uL — ABNORMAL HIGH (ref 4.0–10.5)
WBC: 9.6 10*3/uL (ref 4.0–10.5)
nRBC: 0 % (ref 0.0–0.2)
nRBC: 0 % (ref 0.0–0.2)

## 2019-06-19 LAB — CREATININE, SERUM
Creatinine, Ser: 1.06 mg/dL (ref 0.61–1.24)
GFR calc Af Amer: >60 mL/min (ref >60)
GFR calc non Af Amer: >60 mL/min (ref >60)

## 2019-06-19 LAB — FIBRINOGEN: Fibrinogen: 375 mg/dL (ref 210–475)

## 2019-06-19 LAB — MRSA PCR SCREENING: MRSA by PCR: NEGATIVE

## 2019-06-19 LAB — RESPIRATORY PANEL BY RT PCR (FLU A&B, COVID)
Influenza A by PCR: NEGATIVE
Influenza B by PCR: NEGATIVE
SARS Coronavirus 2 by RT PCR: NEGATIVE

## 2019-06-19 LAB — HEPARIN LEVEL (UNFRACTIONATED): Heparin Unfractionated: 0.28 IU/mL — ABNORMAL LOW (ref 0.30–0.70)

## 2019-06-19 LAB — GLUCOSE, CAPILLARY: Glucose-Capillary: 121 mg/dL — ABNORMAL HIGH (ref 70–99)

## 2019-06-19 LAB — BUN: BUN: 19 mg/dL (ref 8–23)

## 2019-06-19 LAB — HIV ANTIBODY (ROUTINE TESTING W REFLEX): HIV Screen 4th Generation wRfx: NONREACTIVE

## 2019-06-19 SURGERY — LOWER EXTREMITY ANGIOGRAPHY
Anesthesia: Moderate Sedation | Laterality: Right

## 2019-06-19 MED ORDER — ONDANSETRON HCL 4 MG/2ML IJ SOLN: 4.0000 mg | Freq: Four times a day (QID) | INTRAMUSCULAR | Status: DC | PRN

## 2019-06-19 MED ORDER — HEPARIN (PORCINE) 25000 UT/250ML-% IV SOLN
INTRAVENOUS | Status: AC
  Administered 2019-06-19: 600 [IU]/h via INTRAVENOUS
  Filled 2019-06-19: qty 250

## 2019-06-19 MED ORDER — MIDAZOLAM HCL 2 MG/ML PO SYRP: 8.0000 mg | ORAL_SOLUTION | Freq: Once | ORAL | Status: DC | PRN

## 2019-06-19 MED ORDER — IODIXANOL 320 MG/ML IV SOLN
INTRAVENOUS | Status: DC | PRN
  Administered 2019-06-19: 75 mL via INTRA_ARTERIAL

## 2019-06-19 MED ORDER — SODIUM CHLORIDE 0.9 % IV SOLN: INTRAVENOUS | Status: DC

## 2019-06-19 MED ORDER — ACETAMINOPHEN 650 MG RE SUPP
650.0000 mg | Freq: Four times a day (QID) | RECTAL | Status: DC | PRN
  Filled 2019-06-19: qty 1

## 2019-06-19 MED ORDER — BENAZEPRIL HCL 20 MG PO TABS
20.0000 mg | ORAL_TABLET | Freq: Every day | ORAL | Status: DC
  Filled 2019-06-19 (×2): qty 1

## 2019-06-19 MED ORDER — CEFAZOLIN SODIUM-DEXTROSE 2-4 GM/100ML-% IV SOLN
INTRAVENOUS | Status: AC
  Administered 2019-06-19: 2 g via INTRAVENOUS
  Filled 2019-06-19: qty 100

## 2019-06-19 MED ORDER — SODIUM CHLORIDE 0.9% FLUSH
3.0000 mL | Freq: Two times a day (BID) | INTRAVENOUS | Status: DC
  Administered 2019-06-19: 10 mL via INTRAVENOUS
  Administered 2019-06-20 – 2019-06-21 (×3): 3 mL via INTRAVENOUS

## 2019-06-19 MED ORDER — GABAPENTIN 300 MG PO CAPS
300.0000 mg | ORAL_CAPSULE | Freq: Every day | ORAL | Status: DC
  Administered 2019-06-19 – 2019-06-20 (×2): 300 mg via ORAL
  Filled 2019-06-19 (×2): qty 1

## 2019-06-19 MED ORDER — FENTANYL CITRATE (PF) 100 MCG/2ML IJ SOLN: 12.5000 ug | Freq: Once | INTRAMUSCULAR | Status: DC | PRN

## 2019-06-19 MED ORDER — MIDAZOLAM HCL 2 MG/2ML IJ SOLN
INTRAMUSCULAR | Status: DC | PRN
  Administered 2019-06-19: 2 mg via INTRAVENOUS
  Administered 2019-06-19 (×2): 1 mg via INTRAVENOUS

## 2019-06-19 MED ORDER — CEFAZOLIN SODIUM-DEXTROSE 2-4 GM/100ML-% IV SOLN: 2.0000 g | Freq: Once | INTRAVENOUS | Status: AC

## 2019-06-19 MED ORDER — CEFAZOLIN SODIUM-DEXTROSE 2-4 GM/100ML-% IV SOLN: 2.0000 g | Freq: Once | INTRAVENOUS | Status: DC

## 2019-06-19 MED ORDER — OXYCODONE HCL 5 MG PO TABS
10.0000 mg | ORAL_TABLET | ORAL | Status: DC | PRN
  Administered 2019-06-19: 10 mg via ORAL
  Filled 2019-06-19: qty 2

## 2019-06-19 MED ORDER — KETOROLAC TROMETHAMINE 30 MG/ML IJ SOLN
30.0000 mg | Freq: Four times a day (QID) | INTRAMUSCULAR | Status: DC
  Administered 2019-06-20 (×2): 30 mg via INTRAVENOUS
  Filled 2019-06-19 (×3): qty 1

## 2019-06-19 MED ORDER — OXYCODONE HCL 5 MG PO TABS: 5.0000 mg | ORAL_TABLET | ORAL | Status: DC | PRN

## 2019-06-19 MED ORDER — SODIUM CHLORIDE 0.9 % IV SOLN: 250.0000 mL | INTRAVENOUS | Status: DC | PRN

## 2019-06-19 MED ORDER — TAMSULOSIN HCL 0.4 MG PO CAPS
0.4000 mg | ORAL_CAPSULE | Freq: Every day | ORAL | Status: DC
  Administered 2019-06-19 – 2019-06-21 (×3): 0.4 mg via ORAL
  Filled 2019-06-19 (×3): qty 1

## 2019-06-19 MED ORDER — HYDROMORPHONE HCL 1 MG/ML IJ SOLN: 1.0000 mg | Freq: Once | INTRAMUSCULAR | Status: DC | PRN

## 2019-06-19 MED ORDER — AMLODIPINE BESY-BENAZEPRIL HCL 5-20 MG PO CAPS: 1.0000 | ORAL_CAPSULE | Freq: Two times a day (BID) | ORAL | Status: DC

## 2019-06-19 MED ORDER — SODIUM CHLORIDE 0.9 % IV SOLN: INTRAVENOUS | Status: AC

## 2019-06-19 MED ORDER — ACETAMINOPHEN 325 MG PO TABS: 650.0000 mg | ORAL_TABLET | Freq: Four times a day (QID) | ORAL | Status: DC | PRN

## 2019-06-19 MED ORDER — HEPARIN (PORCINE) 25000 UT/250ML-% IV SOLN: 750.0000 [IU]/h | INTRAVENOUS | Status: DC

## 2019-06-19 MED ORDER — SODIUM CHLORIDE 0.9 % IV SOLN
1.0000 mg/h | INTRAVENOUS | Status: AC
  Administered 2019-06-19: 1 mg/h
  Filled 2019-06-19: qty 10

## 2019-06-19 MED ORDER — HEPARIN SODIUM (PORCINE) 1000 UNIT/ML IJ SOLN
INTRAMUSCULAR | Status: DC | PRN
  Administered 2019-06-19: 5000 [IU] via INTRAVENOUS

## 2019-06-19 MED ORDER — ROSUVASTATIN CALCIUM 10 MG PO TABS
20.0000 mg | ORAL_TABLET | Freq: Every day | ORAL | Status: DC
  Administered 2019-06-19 – 2019-06-21 (×3): 20 mg via ORAL
  Filled 2019-06-19 (×2): qty 2
  Filled 2019-06-19 (×2): qty 1
  Filled 2019-06-19: qty 2

## 2019-06-19 MED ORDER — METHYLPREDNISOLONE SODIUM SUCC 125 MG IJ SOLR: 125.0000 mg | Freq: Once | INTRAMUSCULAR | Status: DC | PRN

## 2019-06-19 MED ORDER — SODIUM CHLORIDE 0.9% FLUSH: 3.0000 mL | INTRAVENOUS | Status: DC | PRN

## 2019-06-19 MED ORDER — DIPHENHYDRAMINE HCL 50 MG/ML IJ SOLN: 50.0000 mg | Freq: Once | INTRAMUSCULAR | Status: DC | PRN

## 2019-06-19 MED ORDER — LORAZEPAM 1 MG PO TABS
1.0000 mg | ORAL_TABLET | ORAL | Status: DC | PRN
  Administered 2019-06-19 – 2019-06-20 (×2): 1 mg via ORAL
  Filled 2019-06-19 (×2): qty 1

## 2019-06-19 MED ORDER — MIDAZOLAM HCL 5 MG/5ML IJ SOLN
INTRAMUSCULAR | Status: AC
  Filled 2019-06-19: qty 5

## 2019-06-19 MED ORDER — ALTEPLASE 2 MG IJ SOLR
INTRAMUSCULAR | Status: DC | PRN
  Administered 2019-06-19: 6 mg

## 2019-06-19 MED ORDER — CHLORHEXIDINE GLUCONATE CLOTH 2 % EX PADS
6.0000 | MEDICATED_PAD | Freq: Every day | CUTANEOUS | Status: DC
  Administered 2019-06-20 – 2019-06-21 (×2): 6 via TOPICAL

## 2019-06-19 MED ORDER — FENTANYL CITRATE (PF) 100 MCG/2ML IJ SOLN
INTRAMUSCULAR | Status: AC
  Filled 2019-06-19: qty 2

## 2019-06-19 MED ORDER — FENTANYL CITRATE (PF) 100 MCG/2ML IJ SOLN
INTRAMUSCULAR | Status: DC | PRN
  Administered 2019-06-19: 25 ug via INTRAVENOUS
  Administered 2019-06-19: 50 ug via INTRAVENOUS
  Administered 2019-06-19: 25 ug via INTRAVENOUS

## 2019-06-19 MED ORDER — SERTRALINE HCL 50 MG PO TABS
100.0000 mg | ORAL_TABLET | Freq: Every day | ORAL | Status: DC
  Administered 2019-06-20 – 2019-06-21 (×2): 100 mg via ORAL
  Filled 2019-06-19 (×2): qty 2
  Filled 2019-06-19: qty 1

## 2019-06-19 MED ORDER — FAMOTIDINE 20 MG PO TABS: 40.0000 mg | ORAL_TABLET | Freq: Once | ORAL | Status: DC | PRN

## 2019-06-19 MED ORDER — SODIUM CHLORIDE 0.9 % IV SOLN
0.5000 mg/h | INTRAVENOUS | Status: DC
  Administered 2019-06-19 – 2019-06-20 (×2): 0.5 mg/h
  Filled 2019-06-19 (×3): qty 10

## 2019-06-19 MED ORDER — ADULT MULTIVITAMIN W/MINERALS CH
1.0000 | ORAL_TABLET | Freq: Every evening | ORAL | Status: DC
  Administered 2019-06-19 – 2019-06-20 (×2): 1 via ORAL
  Filled 2019-06-19 (×2): qty 1

## 2019-06-19 MED ORDER — MORPHINE SULFATE (PF) 4 MG/ML IV SOLN: 2.0000 mg | INTRAVENOUS | Status: DC | PRN

## 2019-06-19 MED ORDER — AMLODIPINE BESYLATE 5 MG PO TABS
5.0000 mg | ORAL_TABLET | Freq: Every day | ORAL | Status: DC
  Administered 2019-06-21: 5 mg via ORAL
  Filled 2019-06-19: qty 1

## 2019-06-19 MED ORDER — HEPARIN SODIUM (PORCINE) 1000 UNIT/ML IJ SOLN
INTRAMUSCULAR | Status: AC
  Filled 2019-06-19: qty 1

## 2019-06-19 MED ORDER — HEPARIN SODIUM (PORCINE) 5000 UNIT/ML IJ SOLN: 5000.0000 [IU] | Freq: Three times a day (TID) | INTRAMUSCULAR | Status: DC

## 2019-06-19 SURGICAL SUPPLY — 15 items
CANISTER PENUMBRA ENGINE (MISCELLANEOUS) ×3 IMPLANT
CATH BEACON 5 .035 65 KMP TIP (CATHETERS) ×3 IMPLANT
CATH BEACON 5 .038 100 VERT TP (CATHETERS) ×3 IMPLANT
CATH INDIGO CAT6 KIT (CATHETERS) ×3 IMPLANT
CATH INFUS 135CMX50CM (CATHETERS) ×3 IMPLANT
CATH PIG 70CM (CATHETERS) ×3 IMPLANT
KIT CATH CVC 3 LUMEN 7FR 8IN (MISCELLANEOUS) ×3 IMPLANT
PACK ANGIOGRAPHY (CUSTOM PROCEDURE TRAY) ×3 IMPLANT
SHEATH BRITE TIP 5FRX11 (SHEATH) ×3 IMPLANT
SHEATH PINNACLE ST 6F 45CM (SHEATH) ×3 IMPLANT
SUT PROLENE 0 CT 1 30 (SUTURE) ×3 IMPLANT
SYR MEDRAD MARK 7 150ML (SYRINGE) ×3 IMPLANT
TUBING CONTRAST HIGH PRESS 72 (TUBING) ×3 IMPLANT
WIRE G V18X300CM (WIRE) ×3 IMPLANT
WIRE J 3MM .035X145CM (WIRE) ×3 IMPLANT

## 2019-06-19 NOTE — H&P (Signed)
[facility] History & Physical Update  The patient was interviewed and re-examined.  The patient's previous History and Physical has been reviewed and is unchanged.  There is no change in the plan of care. We plan to proceed with the scheduled procedure.  Festus Barren, MD  06/19/2019, 10:04 AM

## 2019-06-19 NOTE — Consult Note (Signed)
ANTICOAGULATION CONSULT NOTE - Initial Consult  Pharmacy Consult for Heparin Infusion Indication: VTE Treatment  Allergies  Allergen Reactions  . Hydrocodone Swelling    Patient Measurements: Height: 5\' 10"  (177.8 cm) Weight: 157 lb (71.2 kg) IBW/kg (Calculated) : 73 Heparin Dosing Weight: 71.2 kg  Vital Signs: Temp: 98 F (36.7 C) (02/04 1058) Temp Source: Oral (02/04 1058) BP: 125/72 (02/04 1500) Pulse Rate: 71 (02/04 1500)  Labs: Recent Labs    06/19/19 1125  CREATININE 1.06    Estimated Creatinine Clearance: 71.8 mL/min (by C-G formula based on SCr of 1.06 mg/dL).   Medical History: Past Medical History:  Diagnosis Date  . Collagen vascular disease ([place])   . Hyperlipidemia   . Hypertension   . Peripheral vascular disease (HCC)     Medications:  Medications Prior to Admission  Medication Sig Dispense Refill Last Dose  . amLODipine-benazepril (LOTREL) 5-20 MG capsule Take 1 capsule by mouth 2 (two) times daily.   5 06/19/2019 at Unknown time  . clopidogrel (PLAVIX) 75 MG tablet TAKE 1 TABLET BY MOUTH EVERY DAY 30 tablet 5 06/19/2019 at Unknown time  . furosemide (LASIX) 20 MG tablet TK 1 T PO QD PRN.  2 06/19/2019 at Unknown time  . Omega-3 Fatty Acids (FISH OIL) 1200 MG CAPS Take 2,400 mg by mouth 2 (two) times daily.   Past Week at Unknown time  . rosuvastatin (CRESTOR) 20 MG tablet Take 20 mg by mouth daily.   06/18/2019 at Unknown time  . sertraline (ZOLOFT) 100 MG tablet TK 1 T PO QD  3 06/19/2019 at Unknown time  . tamsulosin (FLOMAX) 0.4 MG CAPS capsule Take 0.4 mg by mouth daily.   06/18/2019 at Unknown time  . aspirin EC 81 MG EC tablet Take 1 tablet (81 mg total) by mouth daily. (Patient not taking: Reported on 01/07/2019)     . gabapentin (NEURONTIN) 300 MG capsule TAKE 1 CAPSULE BY MOUTH AT BEDTIME 60 capsule 0   . Multiple Vitamin (MULTIVITAMIN WITH MINERALS) TABS tablet Take 1 tablet by mouth every evening.   Not Taking at Unknown time  . oxyCODONE (OXY  IR/ROXICODONE) 5 MG immediate release tablet Take 1 tablet (5 mg total) by mouth every 4 (four) hours as needed for moderate pain. (Patient not taking: Reported on 12/21/2017) 30 tablet 0   . Specialty Vitamins Products (ONE-A-DAY PROSTATE PO) Take 1 tablet by mouth daily. "Super Beta Prostate"   Not Taking at Unknown time  . Zn-Pyg Afri-Nettle-Saw Palmet (SAW PALMETTO COMPLEX PO) Take 2 tablets by mouth daily.   Not Taking at Unknown time   Scheduled:  . [START ON 06/20/2019] amLODipine  5 mg Oral Daily   And  . [START ON 06/20/2019] benazepril  20 mg Oral Daily  . fentaNYL      . gabapentin  300 mg Oral QHS  . heparin      . ketorolac  30 mg Intravenous Q6H  . midazolam      . multivitamin with minerals  1 tablet Oral QPM  . rosuvastatin  20 mg Oral Daily  . [START ON 06/20/2019] sertraline  100 mg Oral Daily  . sodium chloride flush  3 mL Intravenous Q12H  . tamsulosin  0.4 mg Oral Daily   Infusions:  . sodium chloride 75 mL/hr at 06/19/19 1139  . sodium chloride    . alteplase (LIMB ISCHEMIA) 10 mg in normal saline (0.02 mg/mL) infusion 1 mg/hr (06/19/19 1403)  . alteplase (LIMB ISCHEMIA) 10 mg  in normal saline (0.02 mg/mL) infusion    . heparin 600 Units/hr (06/19/19 1350)   PRN: sodium chloride, acetaminophen **OR** acetaminophen, fentaNYL (SUBLIMAZE) injection, HYDROmorphone (DILAUDID) injection, morphine injection, ondansetron (ZOFRAN) IV, oxyCODONE, oxyCODONE, sodium chloride flush Anti-infectives (From admission, onward)   Start     Dose/Rate Route Frequency Ordered Stop   06/20/19 0000  ceFAZolin (ANCEF) IVPB 2g/100 mL premix  Status:  Discontinued    Note to Pharmacy: To be given in specials   2 g 200 mL/hr over 30 Minutes Intravenous  Once 06/19/19 1341 06/19/19 1524   06/19/19 1030  ceFAZolin (ANCEF) IVPB 2g/100 mL premix     2 g 200 mL/hr over 30 Minutes Intravenous  Once 06/19/19 1029 06/19/19 1259      Assessment: Pharmacy has been consulted to initiate Heparin  Infusion in 64yo patient complaining of sudden pain and coldness of right lower extremity that began on 2/2. Patient has had multiple vascular interventions, including a right femoral endarterectomy on 12/23/2015 in addition to a femoral-tibial bypass graft also on the right lower extremity. Limited duplex showed no pulsatile flow seen within the right posterior tibial artery, anterior tibial artery and peroneal artery. The right lower extremity bypass graft appears to be occluded.  Patient undergoing mechanical thrombectomy of right lower extremity today. Of note, patient also received alteplase during surgery. Patient has no prior history of regularly scheduled anticoagulant use. Heparin infusion was started in the Cath lab, therefore baseline labs will be omitted as they will be futile to therapy.   Goal of Therapy:  Heparin level 0.2-0.5 units/ml Monitor platelets by anticoagulation protocol: Yes   Plan:  Per Vascular Surgeon, will omit bolus and start patient on heparin infusion at 600 units/hr Check anti-Xa level in 6 hours and daily while on heparin Continue to monitor H&H and platelets  Cinque Begley A Kaslyn [place] 06/19/2019,3:54 PM

## 2019-06-19 NOTE — Op Note (Signed)
[facility]  Percutaneous Study/Intervention Procedural Note   Date of Surgery: 06/19/2019  Surgeon(s):Cherri Yera    Assistants:none  Pre-operative Diagnosis: PAD with rest Salas right lower extremity, acute on chronic ischemia with previous surgical bypass in the right leg  Post-operative diagnosis:  Same  Procedure(s) Performed:             1.  Ultrasound guidance for vascular access left femoral artery             2.  Catheter placement into right peroneal artery from left femoral approach             3.  Aortogram and selective right lower extremity angiogram occluding selective injection of the right peroneal artery             4.  Catheter directed thrombolytic therapy with a total of 6 mg TPA instilled in the right femoral to distal bypass             5.  Mechanical thrombectomy using the penumbra cat 6 device to the right common femoral artery, femoral to tibioperoneal trunk bypass, and peroneal arteries  6.  Placement of infusion catheter for continuous thrombolytic therapy using a 135 cm total length 50 cm working length catheter from the right common femoral artery through the bypass and into the tibioperoneal trunk             7.  Ultrasound guidance vascular access left femoral vein             8.  Placement of a left femoral triple-lumen catheter placement for durable venous access using ultrasound fluoroscopic guidance  EBL: 300 cc  Contrast: 75 cc  Fluoro Time: 9.8 minutes  Moderate Conscious Sedation Time: approximately 55 minutes using 4 mg of Versed and 100 mcg of Fentanyl              Indications:  Patient is a 64 y.o.male with 24 to 48 hours of rest Salas and a cold right foot with signs of acute ischemia.  He has previously had femoral to distal as well as a jump graft bypass to an additional tibial vessel for rest Salas about 3 to 4 years ago. The patient is brought in for angiography for further evaluation and potential treatment.  Due to the  limb threatening nature of the situation, angiogram was performed for attempted limb salvage. The patient is aware that if the procedure fails, amputation would be expected.  The patient also understands that even with successful revascularization, amputation may still be required due to the severity of the situation.  Risks and benefits are discussed and informed consent is obtained.   Procedure:  The patient was identified and appropriate procedural time out was performed.  The patient was then placed supine on the table and prepped and draped in the usual sterile fashion. Moderate conscious sedation was administered during a face to face encounter with the patient throughout the procedure with my supervision of the RN administering medicines and monitoring the patient's vital signs, pulse oximetry, telemetry and mental status throughout from the start of the procedure until the patient was taken to the recovery room. Ultrasound was used to evaluate the left common femoral artery.  It was patent although somewhat diseased.  A digital ultrasound image was acquired.  A Seldinger needle was used to access the left common femoral artery under direct ultrasound guidance and a permanent image was performed.  A 0.035 J wire was advanced without  resistance and a 5Fr sheath was placed.  Pigtail catheter was placed into the aorta and an AP aortogram was performed. The renal arteries were not well seen but this may have been due to catheter position.  The aorta was patent.  The left iliac system had mild disease that did not appear flow-limiting.  The right common iliac artery was patent but the proximal external iliac artery had some degree of stenosis although it was difficult to ascertain on today's study.  This did appear to be in the 50 to 60% range.  I then crossed the aortic bifurcation and advanced to the right femoral head. Selective right lower extremity angiogram was then performed. This demonstrated occlusion  of the femoral to distal bypass with very poor distal reconstitution.  Tibial vessels really not seen on initial imaging.  The profunda femoris artery had about a 90% stenosis at its origin.  The native SFA was chronically occluded. It was felt that it was in the patient's best interest to proceed with intervention after these images to avoid a second procedure and a larger amount of contrast and fluoroscopy based off of the findings from the initial angiogram. The patient was systemically heparinized and a 6 [place] destination sheath was then placed over the Terumo Advantage wire. I then used a Kumpe catheter and the advantage wire to easily navigate into the occluded bypass graft and down through the bypass graft.  The Kumpe catheter was advanced into the proximal peroneal artery where selective imaging showed a focal stenosis in the mid peroneal artery in the 70% range but otherwise this was continuous down distally.  I then began by instilling 6 mg of TPA in the occluded bypass graft.  I exchanged for a 0.018 wire and brought the penumbra cat 6 catheter onto the field.  Several passes with the penumbra cat 6 catheter used throughout the bypass graft and down into the tibioperoneal trunk and proximal peroneal artery.  A large amount of thrombus was removed, but imaging showed significant amount of residual thrombus present throughout the bypass graft as well as a blob of thrombus in the common femoral artery.  No intervention was performed on the profunda femoris artery at this time to try to avoid thrombus entering the profunda femoris system as well.  We would consider intervening on this at a later time.  The penumbra cat 6 catheter was brought back onto the field and runs in the common femoral artery in the proximal portion of the bypass where the thrombus was most stents were performed.  Improvement but not resolution was seen and there was still a significant amount of thrombus within the bypass graft.  I  felt our best chance for restoring patency would be to do continuous thrombolytic therapy with TPA and I placed a 135 cm total length 50 cm working length thrombolytic catheter in the bypass graft with the most proximal segment just into the common femoral artery and the most distal segment in the tibioperoneal trunk.  It was secured in place with a Prolene suture as was the sheath.  To allow durable venous access including lab draws while he is on thrombolytic therapy, central line was placed.  The left femoral vein was visualized with ultrasound and found to be widely patent.  It was then accessed under direct ultrasound guidance without difficulty with a Seldinger needle.  A J-wire was placed.  After skin nick and dilatation a triple-lumen catheter was placed over the wire and the wire was  removed.  All 3 lm withdrew dark red nonpulsatile blood and flushed easily with sterile saline.  Fluoroscopy was used to show the catheter tip at the distal IVC without kinks in the catheter.  The catheter was secured with 2 silk sutures to the skin. I elected to terminate the procedure. The patient was taken to the recovery room in stable condition having tolerated the procedure well.  Findings:               Aortogram:  The renal arteries were not well seen but this may have been due to catheter position.  The aorta was patent.  The left iliac system had mild disease that did not appear flow-limiting.  The right common iliac artery was patent but the proximal external iliac artery had some degree of stenosis although it was difficult to ascertain on today's study.  This did appear to be in the 50 to 60% range.             Right lower Extremity: Occlusion of the femoral to distal bypass with very poor distal reconstitution.  Tibial vessels really not seen on initial imaging.  The profunda femoris artery had about a 90% stenosis at its origin.  The native SFA was chronically occluded.   Disposition: Patient was taken to  the recovery room in stable condition having tolerated the procedure well.  Complications: None  Travis Salas 06/19/2019 1:26 PM   This note was created with Dragon Medical transcription system. Any errors in dictation are purely unintentional.

## 2019-06-20 ENCOUNTER — Encounter: Payer: Self-pay | Admitting: Cardiology

## 2019-06-20 ENCOUNTER — Encounter
Admission: RE | Disposition: A | Discharge status: Home / Self Care | Payer: Self-pay | Source: Ambulatory Visit | Attending: Vascular Surgery

## 2019-06-20 DIAGNOSIS — I70221 Atherosclerosis of native arteries of extremities with rest pain, right leg: Secondary | ICD-10-CM

## 2019-06-20 HISTORY — PX: LOWER EXTREMITY ANGIOGRAPHY: CATH118251

## 2019-06-20 LAB — CBC
HCT: 31.6 % — ABNORMAL LOW (ref 39.0–52.0)
HCT: 31.2 % — ABNORMAL LOW (ref 39.0–52.0)
HCT: 28.6 % — ABNORMAL LOW (ref 39.0–52.0)
Hemoglobin: 10.5 g/dL — ABNORMAL LOW (ref 13.0–17.0)
Hemoglobin: 10.4 g/dL — ABNORMAL LOW (ref 13.0–17.0)
Hemoglobin: 9.6 g/dL — ABNORMAL LOW (ref 13.0–17.0)
MCH: 31 pg (ref 26.0–34.0)
MCH: 31.3 pg (ref 26.0–34.0)
MCH: 31.2 pg (ref 26.0–34.0)
MCHC: 33.2 g/dL (ref 30.0–36.0)
MCHC: 33.3 g/dL (ref 30.0–36.0)
MCHC: 33.6 g/dL (ref 30.0–36.0)
MCV: 93.2 fL (ref 80.0–100.0)
MCV: 94 fL (ref 80.0–100.0)
MCV: 92.9 fL (ref 80.0–100.0)
Platelets: 127 10*3/uL — ABNORMAL LOW (ref 150–400)
Platelets: 123 10*3/uL — ABNORMAL LOW (ref 150–400)
Platelets: 131 10*3/uL — ABNORMAL LOW (ref 150–400)
RBC: 3.39 MIL/uL — ABNORMAL LOW (ref 4.22–5.81)
RBC: 3.32 MIL/uL — ABNORMAL LOW (ref 4.22–5.81)
RBC: 3.08 MIL/uL — ABNORMAL LOW (ref 4.22–5.81)
RDW: 13.3 % (ref 11.5–15.5)
RDW: 13.3 % (ref 11.5–15.5)
RDW: 13.4 % (ref 11.5–15.5)
WBC: 7.2 10*3/uL (ref 4.0–10.5)
WBC: 6.7 10*3/uL (ref 4.0–10.5)
WBC: 6.7 10*3/uL (ref 4.0–10.5)
nRBC: 0 % (ref 0.0–0.2)
nRBC: 0 % (ref 0.0–0.2)
nRBC: 0 % (ref 0.0–0.2)

## 2019-06-20 LAB — HEPARIN LEVEL (UNFRACTIONATED)
Heparin Unfractionated: <0.1 IU/mL — ABNORMAL LOW (ref 0.30–0.70)
Heparin Unfractionated: <0.1 IU/mL — ABNORMAL LOW (ref 0.30–0.70)

## 2019-06-20 LAB — BASIC METABOLIC PANEL
Anion gap: 7 (ref 5–15)
Anion gap: 8 (ref 5–15)
BUN: 19 mg/dL (ref 8–23)
BUN: 18 mg/dL (ref 8–23)
CO2: 26 mmol/L (ref 22–32)
CO2: 25 mmol/L (ref 22–32)
Calcium: 8.4 mg/dL — ABNORMAL LOW (ref 8.9–10.3)
Calcium: 8.4 mg/dL — ABNORMAL LOW (ref 8.9–10.3)
Chloride: 104 mmol/L (ref 98–111)
Chloride: 104 mmol/L (ref 98–111)
Creatinine, Ser: 1.22 mg/dL (ref 0.61–1.24)
Creatinine, Ser: 0.93 mg/dL (ref 0.61–1.24)
GFR calc Af Amer: >60 mL/min (ref >60)
GFR calc Af Amer: >60 mL/min (ref >60)
GFR calc non Af Amer: >60 mL/min (ref >60)
GFR calc non Af Amer: >60 mL/min (ref >60)
Glucose, Bld: 108 mg/dL — ABNORMAL HIGH (ref 70–99)
Glucose, Bld: 106 mg/dL — ABNORMAL HIGH (ref 70–99)
Potassium: 3.8 mmol/L (ref 3.5–5.1)
Potassium: 3.7 mmol/L (ref 3.5–5.1)
Sodium: 137 mmol/L (ref 135–145)
Sodium: 137 mmol/L (ref 135–145)

## 2019-06-20 LAB — MAGNESIUM: Magnesium: 1.9 mg/dL (ref 1.7–2.4)

## 2019-06-20 LAB — FIBRINOGEN
Fibrinogen: 246 mg/dL (ref 210–475)
Fibrinogen: 229 mg/dL (ref 210–475)

## 2019-06-20 SURGERY — LOWER EXTREMITY ANGIOGRAPHY
Anesthesia: Moderate Sedation | Laterality: Right

## 2019-06-20 MED ORDER — MIDAZOLAM HCL 2 MG/2ML IJ SOLN
INTRAMUSCULAR | Status: DC | PRN
  Administered 2019-06-20: 1 mg via INTRAVENOUS
  Administered 2019-06-20: 2 mg via INTRAVENOUS

## 2019-06-20 MED ORDER — CEFAZOLIN SODIUM-DEXTROSE 2-4 GM/100ML-% IV SOLN
2.0000 g | Freq: Once | INTRAVENOUS | Status: AC
  Administered 2019-06-20: 2 g via INTRAVENOUS

## 2019-06-20 MED ORDER — TIROFIBAN HCL IN NACL 5-0.9 MG/100ML-% IV SOLN
0.1500 ug/kg/min | INTRAVENOUS | Status: DC
  Administered 2019-06-20: 0.15 ug/kg/min via INTRAVENOUS
  Filled 2019-06-20 (×3): qty 100

## 2019-06-20 MED ORDER — FENTANYL CITRATE (PF) 100 MCG/2ML IJ SOLN
INTRAMUSCULAR | Status: DC | PRN
  Administered 2019-06-20: 12.5 ug via INTRAVENOUS
  Administered 2019-06-20: 50 ug via INTRAVENOUS

## 2019-06-20 MED ORDER — ASPIRIN EC 81 MG PO TBEC
81.0000 mg | DELAYED_RELEASE_TABLET | Freq: Every day | ORAL | Status: DC
  Administered 2019-06-21: 81 mg via ORAL
  Filled 2019-06-20: qty 1

## 2019-06-20 MED ORDER — FENTANYL CITRATE (PF) 100 MCG/2ML IJ SOLN
INTRAMUSCULAR | Status: AC
  Filled 2019-06-20: qty 2

## 2019-06-20 MED ORDER — MIDAZOLAM HCL 5 MG/5ML IJ SOLN
INTRAMUSCULAR | Status: AC
  Filled 2019-06-20: qty 5

## 2019-06-20 MED ORDER — APIXABAN 5 MG PO TABS
5.0000 mg | ORAL_TABLET | Freq: Two times a day (BID) | ORAL | Status: DC
  Administered 2019-06-21: 5 mg via ORAL
  Filled 2019-06-20 (×2): qty 1

## 2019-06-20 MED ORDER — IODIXANOL 320 MG/ML IV SOLN
INTRAVENOUS | Status: DC | PRN
  Administered 2019-06-20: 60 mL via INTRA_ARTERIAL

## 2019-06-20 MED ORDER — TIROFIBAN (AGGRASTAT) BOLUS VIA INFUSION
25.0000 ug/kg | Freq: Once | INTRAVENOUS | Status: AC
  Filled 2019-06-20: qty 36

## 2019-06-20 MED ORDER — HEPARIN SODIUM (PORCINE) 1000 UNIT/ML IJ SOLN
INTRAMUSCULAR | Status: AC
  Filled 2019-06-20: qty 1

## 2019-06-20 MED ORDER — TIROFIBAN HCL IV 12.5 MG/250 ML
INTRAVENOUS | Status: AC
  Administered 2019-06-20: 1780 ug via INTRAVENOUS
  Filled 2019-06-20: qty 250

## 2019-06-20 SURGICAL SUPPLY — 16 items
BALLN LUTONIX DCB 4X40X130 (BALLOONS) ×3
BALLN ULTRVRSE 018 2.5X100X150 (BALLOONS) ×3
BALLN ULTRVRSE 6X150X130 (BALLOONS) ×3
BALLOON LUTONIX DCB 4X40X130 (BALLOONS) ×1 IMPLANT
BALLOON ULTRVRSE 6X150X130 (BALLOONS) ×1 IMPLANT
BALLOON ULTRVS 018 2.5X100X150 (BALLOONS) ×1 IMPLANT
CANISTER PENUMBRA ENGINE (MISCELLANEOUS) ×3 IMPLANT
CATH BEACON 5 .035 65 KMP TIP (CATHETERS) ×3 IMPLANT
CATH INDIGO CAT6 KIT (CATHETERS) ×3 IMPLANT
DEVICE PRESTO INFLATION (MISCELLANEOUS) ×3 IMPLANT
DEVICE STARCLOSE SE CLOSURE (Vascular Products) ×3 IMPLANT
NEEDLE ENTRY 21GA 7CM ECHOTIP (NEEDLE) IMPLANT
PACK ANGIOGRAPHY (CUSTOM PROCEDURE TRAY) ×3 IMPLANT
STENT VIABAHN 6X100X120 (Permanent Stent) ×3 IMPLANT
WIRE G V18X300CM (WIRE) ×3 IMPLANT
WIRE J 3MM .035X145CM (WIRE) ×3 IMPLANT

## 2019-06-20 NOTE — Progress Notes (Signed)
Pt. Left Femoral site oozing blood, dressing reinforced multiple times.

## 2019-06-20 NOTE — Consult Note (Signed)
ANTICOAGULATION CONSULT NOTE -  Pharmacy Consult for Heparin Infusion Indication: VTE Treatment  Allergies  Allergen Reactions  . Hydrocodone Swelling    Patient Measurements: Height: 5\' 10"  (177.8 cm) Weight: 157 lb (71.2 kg) IBW/kg (Calculated) : 73 Heparin Dosing Weight: 71.2 kg  Vital Signs: Temp: 98.7 F (37.1 C) (02/04 1900) Temp Source: Oral (02/04 1900) BP: 120/65 (02/04 2200) Pulse Rate: 77 (02/04 1700)  Labs: Recent Labs    06/19/19 1125 06/19/19 1610 06/19/19 2143  HGB  --  12.7* 11.4*  HCT  --  37.7* 34.7*  PLT  --  145* 131*  HEPARINUNFRC  --  0.28* <0.10*  CREATININE 1.06  --   --     Estimated Creatinine Clearance: 71.8 mL/min (by C-G formula based on SCr of 1.06 mg/dL).   Medical History: Past Medical History:  Diagnosis Date  . Collagen vascular disease (HCC)   . Hyperlipidemia   . Hypertension   . Peripheral vascular disease (HCC)     Medications:  Medications Prior to Admission  Medication Sig Dispense Refill Last Dose  . amLODipine-benazepril (LOTREL) 5-20 MG capsule Take 1 capsule by mouth 2 (two) times daily.   5 06/19/2019 at Unknown time  . clopidogrel (PLAVIX) 75 MG tablet TAKE 1 TABLET BY MOUTH EVERY DAY 30 tablet 5 06/19/2019 at Unknown time  . furosemide (LASIX) 20 MG tablet TK 1 T PO QD PRN.  2 06/19/2019 at Unknown time  . Omega-3 Fatty Acids (FISH OIL) 1200 MG CAPS Take 2,400 mg by mouth 2 (two) times daily.   Past Week at Unknown time  . rosuvastatin (CRESTOR) 20 MG tablet Take 20 mg by mouth daily.   06/18/2019 at Unknown time  . sertraline (ZOLOFT) 100 MG tablet TK 1 T PO QD  3 06/19/2019 at Unknown time  . tamsulosin (FLOMAX) 0.4 MG CAPS capsule Take 0.4 mg by mouth daily.   06/18/2019 at Unknown time  . aspirin EC 81 MG EC tablet Take 1 tablet (81 mg total) by mouth daily. (Patient not taking: Reported on 01/07/2019)     . gabapentin (NEURONTIN) 300 MG capsule TAKE 1 CAPSULE BY MOUTH AT BEDTIME 60 capsule 0   . Multiple Vitamin  (MULTIVITAMIN WITH MINERALS) TABS tablet Take 1 tablet by mouth every evening.   Not Taking at Unknown time  . oxyCODONE (OXY IR/ROXICODONE) 5 MG immediate release tablet Take 1 tablet (5 mg total) by mouth every 4 (four) hours as needed for moderate pain. (Patient not taking: Reported on 12/21/2017) 30 tablet 0   . Specialty Vitamins Products (ONE-A-DAY PROSTATE PO) Take 1 tablet by mouth daily. "Super Beta Prostate"   Not Taking at Unknown time  . Zn-Pyg Afri-Nettle-Saw Palmet (SAW PALMETTO COMPLEX PO) Take 2 tablets by mouth daily.   Not Taking at Unknown time   Scheduled:  . amLODipine  5 mg Oral Daily   And  . benazepril  20 mg Oral Daily  . Chlorhexidine Gluconate Cloth  6 each Topical Daily  . fentaNYL      . gabapentin  300 mg Oral QHS  . heparin      . ketorolac  30 mg Intravenous Q6H  . midazolam      . multivitamin with minerals  1 tablet Oral QPM  . rosuvastatin  20 mg Oral Daily  . sertraline  100 mg Oral Daily  . sodium chloride flush  3 mL Intravenous Q12H  . tamsulosin  0.4 mg Oral Daily   Infusions:  .  sodium chloride 75 mL/hr at 06/19/19 1139  . sodium chloride    . alteplase (LIMB ISCHEMIA) 10 mg in normal saline (0.02 mg/mL) infusion 0.5 mg/hr (06/19/19 2200)  . heparin 600 Units/hr (06/19/19 2200)   PRN: sodium chloride, acetaminophen **OR** acetaminophen, fentaNYL (SUBLIMAZE) injection, HYDROmorphone (DILAUDID) injection, LORazepam, morphine injection, ondansetron (ZOFRAN) IV, oxyCODONE, oxyCODONE, sodium chloride flush Anti-infectives (From admission, onward)   Start     Dose/Rate Route Frequency Ordered Stop   06/20/19 0000  ceFAZolin (ANCEF) IVPB 2g/100 mL premix  Status:  Discontinued    Note to Pharmacy: To be given in specials   2 g 200 mL/hr over 30 Minutes Intravenous  Once 06/19/19 1341 06/19/19 1524   06/19/19 1030  ceFAZolin (ANCEF) IVPB 2g/100 mL premix     2 g 200 mL/hr over 30 Minutes Intravenous  Once 06/19/19 1029 06/19/19 1259       Assessment: Pharmacy has been consulted to initiate Heparin Infusion in 64yo patient complaining of sudden pain and coldness of right lower extremity that began on 2/2. Patient has had multiple vascular interventions, including a right femoral endarterectomy on 12/23/2015 in addition to a femoral-tibial bypass graft also on the right lower extremity. Limited duplex showed no pulsatile flow seen within the right posterior tibial artery, anterior tibial artery and peroneal artery. The right lower extremity bypass graft appears to be occluded.  Patient undergoing mechanical thrombectomy of right lower extremity today. Of note, patient also received alteplase during surgery. Patient has no prior history of regularly scheduled anticoagulant use. Heparin infusion was started in the Cath lab, therefore baseline labs will be omitted as they will be futile to therapy.   Goal of Therapy:  Heparin level 0.2-0.5 units/ml Monitor platelets by anticoagulation protocol: Yes   Plan:  Per Vascular Surgeon, will omit bolus and start patient on heparin infusion at 600 units/hr Check anti-Xa level in 6 hours and daily while on heparin Continue to monitor H&H and platelets  2/4 2143 HL < 0.10, subtherapeutic.  Will increase heparin to 750 units/hr and recheck HL in 6 hours CBC daily while on Heparin  Hart Robinsons A 06/20/2019,12:06 AM

## 2019-06-20 NOTE — Op Note (Signed)
[facility]  Percutaneous Study/Intervention Procedural Note   Date of Surgery:  06/20/2019  Surgeon(s):Vinicius Brockman    Assistants:none  Pre-operative Diagnosis: PAD with rest pain right lower extremity, status post overnight thrombolytic therapy  Post-operative diagnosis:  Same  Procedure(s) Performed:             1.   Right lower extremity angiogram             2.  Catheter placement into right femoral to distal bypass and right profunda femoris artery from left femoral approach             3.   Mechanical thrombectomy using the penumbra cat 6 device to the right femoral to distal bypass for residual thrombus             4.  Percutaneous transluminal angioplasty of right peroneal artery with 2.5 mm diameter by 10 cm length angioplasty balloon             5.   Covered stent placement to the right femoral to distal bypass in the midportion for residual thrombus after thrombectomy using a 6 mm diameter by 10 cm length stent  6.  Percutaneous transluminal angioplasty of the right profunda femoris artery using a 4 mm diameter by 4 cm length Lutonix drug-coated angioplasty balloon             7.  StarClose closure device left femoral artery  EBL: 100 cc  Contrast: 50 cc  Fluoro Time: 6 minutes  Moderate Conscious Sedation Time: approximately 35 minutes using 3 mg of Versed and 62.5 mcg of Fentanyl              Indications:  Patient is a 64 y.o.male with an ischemic right leg.  He has undergone overnight thrombolytic therapy and is brought back for second look angiography and intervention.  Due to the limb threatening nature of the situation, angiogram was performed for attempted limb salvage. The patient is aware that if the procedure fails, amputation would be expected.  The patient also understands that even with successful revascularization, amputation may still be required due to the severity of the situation.  Risks and benefits are discussed and informed consent  is obtained.   Procedure:  The patient was identified and appropriate procedural time out was performed.  The patient was then placed supine on the table and prepped and draped in the usual sterile fashion. Moderate conscious sedation was administered during a face to face encounter with the patient throughout the procedure with my supervision of the RN administering medicines and monitoring the patient's vital signs, pulse oximetry, telemetry and mental status throughout from the start of the procedure until the patient was taken to the recovery room.  The existing thrombolytic catheter was removed and replaced with a V 18 wire parked down into the peroneal artery.  Selective right lower extremity angiogram was then performed. This demonstrated there to be a moderate amount of thrombus in the midportion of the bypass graft but a marked improvement after overnight thrombolytic therapy.  There was good flow through the graft.  The proximal bypass anastomosis was widely patent.  There remained a very high-grade stenosis of the profunda femoris artery in the 90% range.  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  This was moderate in size.  All 3 tibial  vessels had flow distally although there was a 70% stenosis in the midportion of the peroneal artery.  The anterior tibial and posterior tibial arteries did not have focal stenosis and were continuous distally. It was felt that it was in the patient's best interest to proceed with intervention after these images to avoid a second procedure and a larger amount of contrast and fluoroscopy based off of the findings from the initial angiogram.  I then used the penumbra cat 6 device in the bypass graft to perform mechanical thrombectomy for the residual thrombus.  There was a mild improvement but still significant thrombus  was seen in the midportion of the graft despite thrombectomy.  I elected to cover this area with a covered stent.  A 6 mm diameter by 10 cm length covered stent which was a Viabahn stent was deployed in the proximal to midportion of the bypass graft and then postdilated with a 6 mm balloon with excellent angiographic completion result and no residual thrombus in the flow lumen.  The peroneal artery stenosis was addressed with a 2.5 mm diameter by 10 cm length angioplasty balloon inflated to 10 atm for 1 minute in the mid peroneal artery.  Completion imaging showed less than 10% residual stenosis in the peroneal artery.  No intervention was performed on the moderate aneurysmal degeneration of the distal bypass anastomosis as it would require exclusion of 2 of the 3 tibial vessels and for a small to medium sized aneurysmal degeneration, I felt it was best to leave this alone.  I did elect to treat the profunda femoris artery.  The wire and catheter were removed from the bypass graft and the tibial vessels.  I then selectively cannulated the profunda femoris artery with a Kumpe catheter and the V 18 wire without difficulty and after selective imaging of the profunda femoris arteries performed I elected to treat this area with a 4 mm diameter by 4 cm length Lutonix drug-coated angioplasty balloon in the proximal profunda femoris artery inflated to 12 atm for 1 minute.  Completion imaging showed some residual stenosis in the 40% range but this was markedly improved and I did not want to treat this any larger for fear of harming the bypass proximal anastomosis. I elected to terminate the procedure. The sheath was removed and StarClose closure device was deployed in the left femoral artery with excellent hemostatic result. The patient was taken to the recovery room in stable condition having tolerated the procedure well.  Findings:                            Right lower Extremity:  This demonstrated there to be a  moderate amount of thrombus in the midportion of the bypass graft but a marked improvement after overnight thrombolytic therapy.  There was good flow through the graft.  The proximal bypass anastomosis was widely patent.  There remained a very high-grade stenosis of the profunda femoris artery in the 90% range.  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  This was moderate in size.  All 3 tibial vessels had flow distally although there was a 70% stenosis in the midportion of the peroneal artery.  The anterior tibial and posterior tibial arteries did not have focal stenosis and were continuous distally.   Disposition: Patient was taken to the  recovery room in stable condition having tolerated the procedure well.  Complications: None  Leotis Pain 06/20/2019 8:26 AM   This note was created with Dragon Medical transcription system. Any errors in dictation are purely unintentional.

## 2019-06-21 LAB — CBC
HCT: 27.5 % — ABNORMAL LOW (ref 39.0–52.0)
Hemoglobin: 9.4 g/dL — ABNORMAL LOW (ref 13.0–17.0)
MCH: 31.6 pg (ref 26.0–34.0)
MCHC: 34.2 g/dL (ref 30.0–36.0)
MCV: 92.6 fL (ref 80.0–100.0)
Platelets: 120 10*3/uL — ABNORMAL LOW (ref 150–400)
RBC: 2.97 MIL/uL — ABNORMAL LOW (ref 4.22–5.81)
RDW: 13.2 % (ref 11.5–15.5)
WBC: 7.5 10*3/uL (ref 4.0–10.5)
nRBC: 0 % (ref 0.0–0.2)

## 2019-06-21 LAB — MAGNESIUM: Magnesium: 1.9 mg/dL (ref 1.7–2.4)

## 2019-06-21 LAB — BASIC METABOLIC PANEL
Anion gap: 7 (ref 5–15)
BUN: 15 mg/dL (ref 8–23)
CO2: 25 mmol/L (ref 22–32)
Calcium: 8.5 mg/dL — ABNORMAL LOW (ref 8.9–10.3)
Chloride: 105 mmol/L (ref 98–111)
Creatinine, Ser: 0.98 mg/dL (ref 0.61–1.24)
GFR calc Af Amer: >60 mL/min (ref >60)
GFR calc non Af Amer: >60 mL/min (ref >60)
Glucose, Bld: 99 mg/dL (ref 70–99)
Potassium: 3.8 mmol/L (ref 3.5–5.1)
Sodium: 137 mmol/L (ref 135–145)

## 2019-06-21 LAB — FIBRINOGEN: Fibrinogen: 267 mg/dL (ref 210–475)

## 2019-06-21 MED ORDER — APIXABAN 5 MG PO TABS: 5.0000 mg | ORAL_TABLET | Freq: Two times a day (BID) | ORAL | 5 refills | Status: DC

## 2019-06-21 NOTE — Discharge Summary (Signed)
Physician Discharge Summary  Patient ID: Travis Salas MRN: [id number] DOB/AGE: Jan 30, 1956 64 y.o.  Admit date: 06/19/2019 Discharge date: 06/21/2019  Admission Diagnoses:  Discharge Diagnoses:  Active Problems:   Limb ischemia   Discharged Condition: good  Hospital Course: 64 yo admitted with acute on chronic right leg pain.  He underwent successful lysis of an occluded bypass graft .  He now has a palpable right DP.    Consults: None    Discharge Exam: Blood pressure (!) 111/59, pulse 79, temperature 98.3 F (36.8 C), temperature source Oral, resp. rate 17, height 5\' 10"  (1.778 m), weight 71.2 kg, SpO2 95 %. left groin ecchymosis, palpable right DP  Disposition:    Allergies as of 06/21/2019      Reactions   Hydrocodone Swelling      Medication List    TAKE these medications   amLODipine-benazepril 5-20 MG capsule Commonly known as: LOTREL Take 1 capsule by mouth 2 (two) times daily.   apixaban 5 MG Tabs tablet Commonly known as: ELIQUIS Take 1 tablet (5 mg total) by mouth 2 (two) times daily.   aspirin 81 MG EC tablet Take 1 tablet (81 mg total) by mouth daily.   Fish Oil 1200 MG Caps Take 2,400 mg by mouth 2 (two) times daily.   furosemide 20 MG tablet Commonly known as: LASIX TK 1 T PO QD PRN.   gabapentin 300 MG capsule Commonly known as: NEURONTIN TAKE 1 CAPSULE BY MOUTH AT BEDTIME   multivitamin with minerals Tabs tablet Take 1 tablet by mouth every evening.   ONE-A-DAY PROSTATE PO Take 1 tablet by mouth daily. "Super Beta Prostate"   oxyCODONE 5 MG immediate release tablet Commonly known as: Oxy IR/ROXICODONE Take 1 tablet (5 mg total) by mouth every 4 (four) hours as needed for moderate pain.   rosuvastatin 20 MG tablet Commonly known as: CRESTOR Take 20 mg by mouth daily.   SAW PALMETTO COMPLEX PO Take 2 tablets by mouth daily.   sertraline 100 MG tablet Commonly known as: ZOLOFT TK 1 T PO QD   tamsulosin 0.4 MG Caps  capsule Commonly known as: FLOMAX Take 0.4 mg by mouth daily.        Signed: Wells Jacksen Salas 06/21/2019, 1:27 PM

## 2019-06-21 NOTE — Progress Notes (Signed)
    Subjective  - POD #1&2  No further right leg pain No further oozing from left groin    Physical Exam:  Left groin with ecchymosis, no significant hematoma Palpable right DP  Right calf is soft       Assessment/Plan:    Will plan for discharge home today with Asa and Eliquis  Wells Lennox Leikam 06/21/2019 1:21 PM --  Vitals:   06/21/19 0600 06/21/19 0700  BP: (!) 98/40 (!) 111/59  Pulse:    Resp: 19 17  Temp:  98.3 F (36.8 C)  SpO2:  95%    Intake/Output Summary (Last 24 hours) at 06/21/2019 1321 Last data filed at 06/21/2019 0500 Gross per 24 hour  Intake 406.12 ml  Output 1250 ml  Net -843.88 ml     Laboratory CBC    Component Value Date/Time   WBC 7.5 06/21/2019 0742   HGB 9.4 (L) 06/21/2019 0742   HCT 27.5 (L) 06/21/2019 0742   PLT 120 (L) 06/21/2019 0742    BMET    Component Value Date/Time   NA 137 06/21/2019 0742   K 3.8 06/21/2019 0742   CL 105 06/21/2019 0742   CO2 25 06/21/2019 0742   GLUCOSE 99 06/21/2019 0742   BUN 15 06/21/2019 0742   CREATININE 0.98 06/21/2019 0742   CALCIUM 8.5 (L) 06/21/2019 0742   GFRNONAA >60 06/21/2019 0742   GFRAA >60 06/21/2019 0742    COAG Lab Results  Component Value Date   INR 1.24 12/21/2015   No results found for: PTT  Antibiotics Anti-infectives (From admission, onward)   Start     Dose/Rate Route Frequency Ordered Stop   06/20/19 0800  ceFAZolin (ANCEF) IVPB 2g/100 mL premix     2 g 200 mL/hr over 30 Minutes Intravenous  Once 06/20/19 0754 06/20/19 1139   06/20/19 0000  ceFAZolin (ANCEF) IVPB 2g/100 mL premix  Status:  Discontinued    Note to Pharmacy: To be given in specials   2 g 200 mL/hr over 30 Minutes Intravenous  Once 06/19/19 1341 06/19/19 1524   06/19/19 1030  ceFAZolin (ANCEF) IVPB 2g/100 mL premix     2 g 200 mL/hr over 30 Minutes Intravenous  Once 06/19/19 1029 06/19/19 1259       V. Charlena Cross, M.D., [facility] [place] Office:  [phone] Pager:  [phone]

## 2019-06-21 NOTE — Progress Notes (Signed)
Patient discharged home. PIV & central line removed. Pt educated regarding. Pt educated regarding home medications & S/S to call MD. Pt given new med instructions. All questions answered to pt satisfaction.

## 2019-06-30 DIAGNOSIS — I10 Essential (primary) hypertension: Secondary | ICD-10-CM | POA: Diagnosis not present

## 2019-06-30 DIAGNOSIS — E785 Hyperlipidemia, unspecified: Secondary | ICD-10-CM | POA: Diagnosis not present

## 2019-07-08 DIAGNOSIS — I10 Essential (primary) hypertension: Secondary | ICD-10-CM | POA: Diagnosis not present

## 2019-07-08 DIAGNOSIS — N400 Enlarged prostate without lower urinary tract symptoms: Secondary | ICD-10-CM | POA: Diagnosis not present

## 2019-07-08 DIAGNOSIS — E785 Hyperlipidemia, unspecified: Secondary | ICD-10-CM | POA: Diagnosis not present

## 2019-07-14 ENCOUNTER — Other Ambulatory Visit (INDEPENDENT_AMBULATORY_CARE_PROVIDER_SITE_OTHER): Payer: Self-pay | Admitting: Vascular Surgery

## 2019-07-14 DIAGNOSIS — I70213 Atherosclerosis of native arteries of extremities with intermittent claudication, bilateral legs: Secondary | ICD-10-CM

## 2019-07-15 ENCOUNTER — Encounter (INDEPENDENT_AMBULATORY_CARE_PROVIDER_SITE_OTHER): Payer: Self-pay | Admitting: Vascular Surgery

## 2019-07-15 ENCOUNTER — Ambulatory Visit (INDEPENDENT_AMBULATORY_CARE_PROVIDER_SITE_OTHER): Payer: BC Managed Care – PPO | Admitting: Vascular Surgery

## 2019-07-15 ENCOUNTER — Ambulatory Visit (INDEPENDENT_AMBULATORY_CARE_PROVIDER_SITE_OTHER): Payer: BC Managed Care – PPO

## 2019-07-15 ENCOUNTER — Other Ambulatory Visit: Payer: Self-pay

## 2019-07-15 VITALS — BP 103/71 | HR 69 | Resp 16 | Ht 70.0 in | Wt 162.0 lb

## 2019-07-15 DIAGNOSIS — I70213 Atherosclerosis of native arteries of extremities with intermittent claudication, bilateral legs: Secondary | ICD-10-CM

## 2019-07-15 DIAGNOSIS — I10 Essential (primary) hypertension: Secondary | ICD-10-CM | POA: Diagnosis not present

## 2019-07-15 DIAGNOSIS — E785 Hyperlipidemia, unspecified: Secondary | ICD-10-CM

## 2019-07-15 NOTE — Patient Instructions (Signed)
Peripheral Vascular Disease  Peripheral vascular disease (PVD) is a disease of the blood vessels that are not part of your heart and brain. A simple term for PVD is poor circulation. In most cases, PVD narrows the blood vessels that carry blood from your heart to the rest of your body. This can reduce the supply of blood to your arms, legs, and internal organs, like your stomach or kidneys. However, PVD most often affects a person's lower legs and feet. Without treatment, PVD tends to get worse. PVD can also lead to acute ischemic limb. This is when an arm or leg suddenly cannot get enough blood. This is a medical emergency. Follow these instructions at home: Lifestyle  Do not use any products that contain nicotine or tobacco, such as cigarettes and e-cigarettes. If you need help quitting, ask your doctor.  Lose weight if you are overweight. Or, stay at a healthy weight as told by your doctor.  Eat a diet that is low in fat and cholesterol. If you need help, ask your doctor.  Exercise regularly. Ask your doctor for activities that are right for you. General instructions  Take over-the-counter and prescription medicines only as told by your doctor.  Take good care of your feet: ? Wear comfortable shoes that fit well. ? Check your feet often for any cuts or sores.  Keep all follow-up visits as told by your doctor This is important. Contact a doctor if:  You have cramps in your legs when you walk.  You have leg pain when you are at rest.  You have coldness in a leg or foot.  Your skin changes.  You are unable to get or have an erection (erectile dysfunction).  You have cuts or sores on your feet that do not heal. Get help right away if:  Your arm or leg turns cold, numb, and blue.  Your arms or legs become red, warm, swollen, painful, or numb.  You have chest pain.  You have trouble breathing.  You suddenly have weakness in your face, arm, or leg.  You become very  confused or you cannot speak.  You suddenly have a very bad headache.  You suddenly cannot see. Summary  Peripheral vascular disease (PVD) is a disease of the blood vessels.  A simple term for PVD is poor circulation. Without treatment, PVD tends to get worse.  Treatment may include exercise, low fat and low cholesterol diet, and quitting smoking. This information is not intended to replace advice given to you by your health care provider. Make sure you discuss any questions you have with your health care provider. Document Revised: 04/13/2017 Document Reviewed: 06/08/2016 Elsevier Patient Education  2020 [employer]  

## 2019-07-15 NOTE — Progress Notes (Signed)
MRN : [id number]  Travis Salas is a 64 y.o. (01-08-1956) male who presents with chief complaint of  Chief Complaint  Patient presents with  . Follow-up    ultrasound follow up   .  History of Present Illness: Patient returns today in follow up of his PAD.  About a month ago, he underwent extensive endovascular revascularization to open his occluded femoral to distal bypass.  We were able to get this open and he went home the day after the second procedure.  He had run continuous thrombolytic therapy overnight.  He does have some reperfusion swelling that is mild to moderate in nature.  His access site is well-healed.  His foot feels much better.  He is having worsening claudication symptoms on the left leg.  He has had significant claudication for a couple of years but has continued to manage this conservatively.  He does not have rest pain or ulceration at this point. ABIs today are 1.03 on the right and 0.53 on the left.  His bypass graft is widely patent.   Current Outpatient Medications  Medication Sig Dispense Refill  . amLODipine-benazepril (LOTREL) 5-20 MG capsule Take 1 capsule by mouth 2 (two) times daily.   5  . apixaban (ELIQUIS) 5 MG TABS tablet Take 1 tablet (5 mg total) by mouth 2 (two) times daily. 60 tablet 5  . aspirin EC 81 MG EC tablet Take 1 tablet (81 mg total) by mouth daily.    . furosemide (LASIX) 20 MG tablet TK 1 T PO QD PRN.  2  . gabapentin (NEURONTIN) 300 MG capsule TAKE 1 CAPSULE BY MOUTH AT BEDTIME 60 capsule 0  . Multiple Vitamin (MULTIVITAMIN WITH MINERALS) TABS tablet Take 1 tablet by mouth every evening.    . Omega-3 Fatty Acids (FISH OIL) 1200 MG CAPS Take 2,400 mg by mouth 2 (two) times daily.    [name] oxyCODONE (OXY IR/ROXICODONE) 5 MG immediate release tablet Take 1 tablet (5 mg total) by mouth every 4 (four) hours as needed for moderate pain. 30 tablet 0  . rosuvastatin (CRESTOR) 20 MG tablet Take 20 mg by mouth daily.    . sertraline (ZOLOFT) 100 MG  tablet TK 1 T PO QD  3  . Specialty Vitamins Products (ONE-A-DAY PROSTATE PO) Take 1 tablet by mouth daily. "Super Beta Prostate"    . tamsulosin (FLOMAX) 0.4 MG CAPS capsule Take 0.4 mg by mouth daily.    [name] Zn-Pyg Afri-Nettle-Saw Palmet (SAW PALMETTO COMPLEX PO) Take 2 tablets by mouth daily.     No current facility-administered medications for this visit.    Past Medical History:  Diagnosis Date  . Collagen vascular disease ([place])   . Hyperlipidemia   . Hypertension   . Peripheral vascular disease [facility])     Past Surgical History:  Procedure Laterality Date  . ENDARTERECTOMY FEMORAL Right 12/23/2015   Procedure: COMMON, FEMORAL, SUPERFICIAL, AND PROFUNDA ENDARTERECTOMY;  Surgeon: Algernon Huxley, MD;  Location: [facility] ORS;  Service: Vascular;  Laterality: Right;  . FEMORAL-TIBIAL BYPASS GRAFT Right 12/23/2015   Procedure: BYPASS GRAFT FEMORAL-TIBIAL ARTERY, COMMON FEMORAL AND PROFUNDIS FEMORAL WITH PATCH ANGIOPLASTY, POPLITEAL ANTERIOR ARTERY BYPASS, ILIOFEMORAL ANGIOGRAM VIABHAM STENT 8 MM DIAMETER X 5 CM LENGTH;  Surgeon: Algernon Huxley, MD;  Location: [facility] OR[facility];  Service: Vascular;  Laterality: Right;  . LOWER EXTREMITY ANGIOGRAPHY Right 06/19/2019   Procedure: LOWER EXTREMITY ANGIOGRAPHY;  Surgeon: Algernon Huxley, MD;  Location: [facility];  Service: Cardiovascular;  Laterality: Right;  . LOWER EXTREMITY ANGIOGRAPHY Right 06/20/2019   Procedure: Lower Extremity Angiography;  Surgeon: Annice Needy, MD;  Location: [facility];  Service: Cardiovascular;  Laterality: Right;  . PERIPHERAL VASCULAR CATHETERIZATION Right 11/08/2015   Procedure: Lower Extremity Angiography;  Surgeon: Annice Needy, MD;  Location: [facility];  Service: Cardiovascular;  Laterality: Right;  . PERIPHERAL VASCULAR CATHETERIZATION Right 12/13/2015   Procedure: Lower Extremity Angiography;  Surgeon: Annice Needy, MD;  Location: [facility];  Service: Cardiovascular;  Laterality: Right;    Social  History Social History   Tobacco Use  . Smoking status: Former Smoker    Packs/day: 1.50    Years: 46.00    Pack years: 69.00    Types: Cigarettes    Quit date: 02/05/2015    Years since quitting: 4.4  . Smokeless tobacco: Never Used  Substance Use Topics  . Alcohol use: Yes    Alcohol/week: 7.0 standard drinks    Types: 7 Cans of beer per week  . Drug use: Yes    Types: Marijuana    Comment: occasional    Family History  Problem Relation Age of Onset  . Diabetes Mother   . Hypertension Brother      Allergies  Allergen Reactions  . Hydrocodone Swelling    REVIEW OF SYSTEMS(Negative unless checked)  Constitutional: [] ???Weight loss[] ???Fever[] ???Chills Cardiac:[] ???Chest pain[] ???Chest pressure[] ???Palpitations [] ???Shortness of breath when laying flat [] ???Shortness of breath at rest [] ???Shortness of breath with exertion. Vascular: [x] ???Pain in legs with walking[] ???Pain in legsat rest[] ???Pain in legs when laying flat [x] ???Claudication [] ???Pain in feet when walking [] ???Pain in feet at rest [] ???Pain in feet when laying flat [] ???History of DVT [] ???Phlebitis [x] ???Swelling in legs [] ???Varicose veins [] ???Non-healing ulcers Pulmonary: [] ???Uses home oxygen [] ???Productive cough[] ???Hemoptysis [] ???Wheeze [] ???COPD [] ???Asthma Neurologic: [] ???Dizziness [] ???Blackouts [] ???Seizures [] ???History of stroke [] ???History of TIA[] ???Aphasia [] ???Temporary blindness[] ???Dysphagia [] ???Weaknessor numbness in arms [x] ???Weakness or numbnessin legs Musculoskeletal: [] ???Arthritis [] ???Joint swelling [] ???Joint pain [] ???Low back pain Hematologic:[] ???Easy bruising[] ???Easy bleeding [] ???Hypercoagulable state [] ???Anemic  Gastrointestinal:[] ???Blood in stool[] ???Vomiting blood[] ???Gastroesophageal reflux/heartburn[] ???Abdominal pain Genitourinary: [] ???Chronic kidney disease  [] ???Difficulturination [] ???Frequenturination [] ???Burning with urination[] ???Hematuria Skin: [] ???Rashes [] ???Ulcers [] ???Wounds Psychological: [] ???History of anxiety[] ???History of major depression.  Physical Examination  BP 103/71 (BP Location: Left Arm)   Pulse 69   Resp 16   Ht 5\' 10"  (1.778 m)   Wt 162 lb (73.5 kg)   BMI 23.24 kg/m  Gen:  WD/WN, NAD Head: /AT, No temporalis wasting. Ear/Nose/Throat: Hearing grossly intact, nares w/o erythema or drainage Eyes: Conjunctiva clear. Sclera non-icteric Neck: Supple.  Trachea midline Pulmonary:  Good air movement, no use of accessory muscles.  Cardiac: RRR, no JVD Vascular:  Vessel Right Left  Radial Palpable Palpable                          PT 1+ Palpable Not Palpable  DP 1+ Palpable 1+ Palpable   Gastrointestinal: soft, non-tender/non-distended. No guarding/reflex.  Musculoskeletal: M/S 5/5 throughout.  No deformity or atrophy. 1+ RLE edema. Neurologic: Sensation grossly intact in extremities.  Symmetrical.  Speech is fluent.  Psychiatric: Judgment intact, Mood & affect appropriate for pt's clinical situation. Dermatologic: No rashes or ulcers noted.  No cellulitis or open wounds.       Labs Recent Results (from the past 2160 hour(s))  I-STAT creatinine     Status: None   Collection Time: 04/21/19  9:03 AM  Result Value Ref Range   Creatinine, Ser 1.10 0.61 - 1.24 mg/dL  Respiratory Panel by RT PCR (Flu A&B, Covid) - Nasopharyngeal Swab     Status: None   Collection Time: 06/19/19  8:18 AM   Specimen: Nasopharyngeal Swab  Result Value Ref Range   SARS Coronavirus 2 by RT PCR NEGATIVE NEGATIVE    Comment: (NOTE) SARS-CoV-2 target nucleic acids are NOT DETECTED. The SARS-CoV-2 RNA is generally detectable in upper respiratoy specimens during the acute phase of infection. The lowest concentration of SARS-CoV-2 viral copies this assay can detect is 131 copies/mL. A negative result does  not preclude SARS-Cov-2 infection and should not be used as the sole basis for treatment or other patient management decisions. A negative result may occur with  improper specimen collection/handling, submission of specimen other than nasopharyngeal swab, presence of viral mutation(s) within the areas targeted by this assay, and inadequate number of viral copies (<131 copies/mL). A negative result must be combined with clinical observations, patient history, and epidemiological information. The expected result is Negative. Fact Sheet for Patients:  https://www.moore.com/ Fact Sheet for Healthcare Providers:  https://www.young.biz/ This test is not yet ap proved or cleared by the [place] FDA and  has been authorized for detection and/or diagnosis of SARS-CoV-2 by FDA under an Emergency Use Authorization (EUA). This EUA will remain  in effect (meaning this test can be used) for the duration of the COVID-19 declaration under Section 564(b)(1) of the Act, 21 U.S.C. section 360bbb-3(b)(1), unless the authorization is terminated or revoked sooner.    Influenza A by PCR NEGATIVE NEGATIVE   Influenza B by PCR NEGATIVE NEGATIVE    Comment: (NOTE) The Xpert Xpress SARS-CoV-2/FLU/RSV assay is intended as an aid in  the diagnosis of influenza from Nasopharyngeal swab specimens and  should not be used as a sole basis for treatment. Nasal washings and  aspirates are unacceptable for Xpert Xpress SARS-CoV-2/FLU/RSV  testing. Fact Sheet for Patients: https://www.moore.com/ Fact Sheet for Healthcare Providers: https://www.young.biz/ This test is not yet approved or cleared by the [place] FDA and  has been authorized for detection and/or diagnosis of SARS-CoV-2 by  FDA under an Emergency Use Authorization (EUA). This EUA will remain  in effect (meaning this test can be used) for the duration of the  Covid-19  declaration under Section 564(b)(1) of the Act, 21  U.S.C. section 360bbb-3(b)(1), unless the authorization is  terminated or revoked. Performed at [facility], [address] [place], [place] [address]   BUN     Status: None   Collection Time: 06/19/19 11:25 AM  Result Value Ref Range   BUN 19 8 - 23 mg/dL    Comment: Performed at [facility], [address] [place], [place] [address]  Creatinine, serum     Status: None   Collection Time: 06/19/19 11:25 AM  Result Value Ref Range   Creatinine, Ser 1.06 0.61 - 1.24 mg/dL   GFR calc non Af Amer >60 >60 mL/min   GFR calc Af Amer >60 >60 mL/min    Comment: Performed at [facility], [address] [place], [place] [address]  Glucose, capillary     Status: Abnormal   Collection Time: 06/19/19  3:29 PM  Result Value Ref Range   Glucose-Capillary 121 (H) 70 - 99 mg/dL  HIV Antibody (routine testing w rflx)     Status: None   Collection Time: 06/19/19  4:10 PM  Result Value Ref Range   HIV Screen 4th Generation wRfx NON REACTIVE NON REACTIVE    Comment: Performed at [facility], [address], [place], [place] [address]  Heparin level (unfractionated) every 6 hours x 4 post-procedure     Status: Abnormal   Collection Time: 06/19/19  4:10 PM  Result Value Ref Range   Heparin Unfractionated 0.28 (L) 0.30 - 0.70 IU/mL    Comment: (NOTE) If heparin results are below expected values, and patient dosage has  been confirmed, suggest follow up testing of antithrombin III levels. Performed at [facility], [address] [place], [place] [address]   CBC every 6 hours x 4 post-procedure     Status: Abnormal   Collection Time: 06/19/19  4:10 PM  Result Value Ref Range   WBC 11.1 (H) 4.0 - 10.5 K/uL   RBC 4.07 (L) 4.22 - 5.81 MIL/uL   Hemoglobin 12.7 (L) 13.0 - 17.0 g/dL   HCT 27.0 (L) 35.0 - 09.3 %   MCV 92.6 80.0 - 100.0 fL   MCH 31.2 26.0 - 34.0 pg   MCHC 33.7 30.0 - 36.0 g/dL   RDW  81.8 29.9 - 37.1 %   Platelets 145 (L) 150 - 400 K/uL   nRBC 0.0 0.0 - 0.2 %    Comment: Performed at [facility], [address] [place], [place] [address]  Fibrinogen every 6 hours x 4 post-procedure     Status: None   Collection Time: 06/19/19  4:10 PM  Result Value Ref Range   Fibrinogen 375 210 - 475 mg/dL    Comment: Performed at [facility], [address] [place], [place] [address]  MRSA PCR Screening     Status: None   Collection Time: 06/19/19  4:34 PM   Specimen: Nasopharyngeal Swab  Result Value Ref Range   MRSA by PCR NEGATIVE NEGATIVE    Comment:        The GeneXpert MRSA Assay (FDA approved for NASAL specimens only), is one component of a comprehensive MRSA colonization surveillance program. It is not intended to diagnose MRSA infection nor to guide or monitor treatment for MRSA infections. Performed at [facility], [address] [place], [place] [address]   CBC every 6 hours x 4 post-procedure     Status: Abnormal   Collection Time: 06/19/19  9:43 PM  Result Value Ref Range   WBC 9.6 4.0 - 10.5 K/uL   RBC 3.74 (L) 4.22 - 5.81 MIL/uL   Hemoglobin 11.4 (L) 13.0 - 17.0 g/dL   HCT 25.8 (L) 52.7 - 78.2 %   MCV 92.8 80.0 - 100.0 fL   MCH 30.5 26.0 - 34.0 pg   MCHC 32.9 30.0 - 36.0 g/dL   RDW 42.3 53.6 - 14.4 %   Platelets 131 (L) 150 - 400 K/uL   nRBC 0.0 0.0 - 0.2 %    Comment: Performed at [facility], [address] [place], [place] [address]  Fibrinogen every 6 hours x 4 post-procedure     Status: None   Collection Time: 06/19/19  9:43 PM  Result Value Ref Range   Fibrinogen 267 210 - 475 mg/dL    Comment: Performed at [facility], [address] [place], [place] [address]  Heparin level (unfractionated)     Status: Abnormal   Collection Time: 06/19/19  9:43 PM  Result Value Ref Range   Heparin Unfractionated <0.10 (L) 0.30 - 0.70 IU/mL    Comment: (NOTE) If heparin results are below expected  values, and patient dosage has  been confirmed, suggest follow up testing  of antithrombin III levels. Performed at [facility], [address] [place], [place] [address]   CBC every 6 hours x 4 post-procedure     Status: Abnormal   Collection Time: 06/20/19  4:32 AM  Result Value Ref Range   WBC 7.2 4.0 - 10.5 K/uL   RBC 3.39 (L) 4.22 - 5.81 MIL/uL   Hemoglobin 10.5 (L) 13.0 - 17.0 g/dL   HCT 98.3 (L) 38.2 - 50.5 %   MCV 93.2 80.0 - 100.0 fL   MCH 31.0 26.0 - 34.0 pg   MCHC 33.2 30.0 - 36.0 g/dL   RDW 39.7 67.3 - 41.9 %   Platelets 127 (L) 150 - 400 K/uL    Comment: Immature Platelet Fraction may be clinically indicated, consider ordering this additional test [id number]    nRBC 0.0 0.0 - 0.2 %    Comment: Performed at [facility], [address] [place], [place] [address]  Fibrinogen every 6 hours x 4 post-procedure     Status: None   Collection Time: 06/20/19  4:32 AM  Result Value Ref Range   Fibrinogen 246 210 - 475 mg/dL    Comment: Performed at [facility], [address] [place], [place] [address]  Heparin level (unfractionated)     Status: Abnormal   Collection Time: 06/20/19  4:33 AM  Result Value Ref Range   Heparin Unfractionated <0.10 (L) 0.30 - 0.70 IU/mL    Comment: REPEATED TO VERIFY (NOTE) If heparin results are below expected values, and patient dosage has  been confirmed, suggest follow up testing of antithrombin III levels. Performed at [facility], [address] [place], [place] [address]   CBC every 6 hours x 4 post-procedure     Status: Abnormal   Collection Time: 06/20/19  6:22 AM  Result Value Ref Range   WBC 6.7 4.0 - 10.5 K/uL   RBC 3.32 (L) 4.22 - 5.81 MIL/uL   Hemoglobin 10.4 (L) 13.0 - 17.0 g/dL   HCT 96.2 (L) 22.9 - 79.8 %   MCV 94.0 80.0 - 100.0 fL   MCH 31.3 26.0 - 34.0 pg   MCHC 33.3 30.0 - 36.0 g/dL   RDW 92.1 19.4 - 17.4 %   Platelets 123 (L) 150 - 400 K/uL    Comment: Immature  Platelet Fraction may be clinically indicated, consider ordering this additional test [id number]    nRBC 0.0 0.0 - 0.2 %    Comment: Performed at [facility], [address] [place], [place] [address]  Fibrinogen every 6 hours x 4 post-procedure     Status: None   Collection Time: 06/20/19  6:22 AM  Result Value Ref Range   Fibrinogen 229 210 - 475 mg/dL    Comment: Performed at [facility], [address] [place] [address], [place], [place] [address]  Magnesium     Status: None   Collection Time: 06/20/19  6:23 AM  Result Value Ref Range   Magnesium 1.9 1.7 - 2.4 mg/dL    Comment: Performed at [facility], [address] [place], [place] [address]  Basic metabolic panel     Status: Abnormal   Collection Time: 06/20/19  6:23 AM  Result Value Ref Range   Sodium 137 135 - 145 mmol/L   Potassium 3.8 3.5 - 5.1 mmol/L   Chloride 104 98 - 111 mmol/L   CO2 26 22 - 32 mmol/L   Glucose, Bld 108 (H) 70 - 99 mg/dL   BUN 19  8 - 23 mg/dL   Creatinine, Ser 5.57 0.61 - 1.24 mg/dL   Calcium 8.4 (L) 8.9 - 10.3 mg/dL   GFR calc non Af Amer >60 >60 mL/min   GFR calc Af Amer >60 >60 mL/min   Anion gap 7 5 - 15    Comment: Performed at [facility], [address] [place], [place] [address]  CBC     Status: Abnormal   Collection Time: 06/20/19  9:17 PM  Result Value Ref Range   WBC 6.7 4.0 - 10.5 K/uL   RBC 3.08 (L) 4.22 - 5.81 MIL/uL   Hemoglobin 9.6 (L) 13.0 - 17.0 g/dL   HCT 54.2 (L) 70.6 - 23.7 %   MCV 92.9 80.0 - 100.0 fL   MCH 31.2 26.0 - 34.0 pg   MCHC 33.6 30.0 - 36.0 g/dL   RDW 62.8 31.5 - 17.6 %   Platelets 131 (L) 150 - 400 K/uL    Comment: Immature Platelet Fraction may be clinically indicated, consider ordering this additional test [id number]    nRBC 0.0 0.0 - 0.2 %    Comment: Performed at [facility], [address] [place], [place] [address]  Basic metabolic panel     Status: Abnormal   Collection Time: 06/20/19  9:17 PM  Result  Value Ref Range   Sodium 137 135 - 145 mmol/L   Potassium 3.7 3.5 - 5.1 mmol/L   Chloride 104 98 - 111 mmol/L   CO2 25 22 - 32 mmol/L   Glucose, Bld 106 (H) 70 - 99 mg/dL   BUN 18 8 - 23 mg/dL   Creatinine, Ser 8.54 0.61 - 1.24 mg/dL   Calcium 8.4 (L) 8.9 - 10.3 mg/dL   GFR calc non Af Amer >60 >60 mL/min   GFR calc Af Amer >60 >60 mL/min   Anion gap 8 5 - 15    Comment: Performed at [facility] [address] [place], [place] [address]  CBC     Status: Abnormal   Collection Time: 06/21/19  7:42 AM  Result Value Ref Range   WBC 7.5 4.0 - 10.5 K/uL   RBC 2.97 (L) 4.22 - 5.81 MIL/uL   Hemoglobin 9.4 (L) 13.0 - 17.0 g/dL   HCT 50.0 (L) 93.8 - 18.2 %   MCV 92.6 80.0 - 100.0 fL   MCH 31.6 26.0 - 34.0 pg   MCHC 34.2 30.0 - 36.0 g/dL   RDW 99.3 71.6 - 96.7 %   Platelets 120 (L) 150 - 400 K/uL    Comment: Immature Platelet Fraction may be clinically indicated, consider ordering this additional test [id number]    nRBC 0.0 0.0 - 0.2 %    Comment: Performed at [facility], [address] [place], [place] [address]  Basic metabolic panel     Status: Abnormal   Collection Time: 06/21/19  7:42 AM  Result Value Ref Range   Sodium 137 135 - 145 mmol/L   Potassium 3.8 3.5 - 5.1 mmol/L   Chloride 105 98 - 111 mmol/L   CO2 25 22 - 32 mmol/L   Glucose, Bld 99 70 - 99 mg/dL   BUN 15 8 - 23 mg/dL   Creatinine, Ser 8.52 0.61 - 1.24 mg/dL   Calcium 8.5 (L) 8.9 - 10.3 mg/dL   GFR calc non Af Amer >60 >60 mL/min   GFR calc Af Amer >60 >60 mL/min   Anion gap 7 5 - 15    Comment: Performed  at [facility], [address] [place], [place] [address]  Magnesium     Status: None   Collection Time: 06/21/19  7:42 AM  Result Value Ref Range   Magnesium 1.9 1.7 - 2.4 mg/dL    Comment: Performed at [facility], [address] [place], [place] [address]    Radiology PERIPHERAL VASCULAR CATHETERIZATION  Result Date: 06/20/2019 See op note  PERIPHERAL  VASCULAR CATHETERIZATION  Result Date: 06/19/2019 See op note  VAS US ABI WITH/WO TBI  Result Date: 06/24/2019 LOWER EXTREMITY DOPPLER STUDY Indications: Peripheral artery disease.  Vascular Interventions: H/O multiple right leg PTA/stents prior to right                         proximal SFA to TP trunk BPG on 12/23/15 as well as TP                         tunk to ATA vein graft. Comparison Study: 01/07/2019 Performing Technologist: Debbe BalesSolomon Mcclary RVS  Examination Guidelines: A complete evaluation includes at minimum, Doppler waveform signals and systolic blood pressure reading at the level of bilateral brachial, anterior tibial, and posterior tibial arteries, when vessel segments are accessible. Bilateral testing is considered an integral part of a complete examination. Photoelectric Plethysmograph (PPG) waveforms and toe systolic pressure readings are included as required and additional duplex testing as needed. Limited examinations for reoccurring indications may be performed as noted.  ABI Findings: +---------+------------------+-----+--------+--------+ Right    Rt Pressure (mmHg)IndexWaveformComment  +---------+------------------+-----+--------+--------+ Brachial 151                                     +---------+------------------+-----+--------+--------+ ATA      0                 0.00 absent           +---------+------------------+-----+--------+--------+ PTA      0                 0.00 absent           +---------+------------------+-----+--------+--------+ Great Toe0                 0.00 Absent           +---------+------------------+-----+--------+--------+ +---------+------------------+-----+----------+-------+ Left     Lt Pressure (mmHg)IndexWaveform  Comment +---------+------------------+-----+----------+-------+ Brachial 154                                      +---------+------------------+-----+----------+-------+ ATA      60                0.39  monophasic        +---------+------------------+-----+----------+-------+ PTA      71                0.46 monophasic        +---------+------------------+-----+----------+-------+ Great Toe25                0.16 Abnormal          +---------+------------------+-----+----------+-------+ +-------+-----------+-----------+------------+------------+ ABI/TBIToday's ABIToday's TBIPrevious ABIPrevious TBI +-------+-----------+-----------+------------+------------+ Right  0          0          .95         .78          +-------+-----------+-----------+------------+------------+  Left   .46        .16        .55         .35          +-------+-----------+-----------+------------+------------+ Right ABIs appear decreased compared to prior study on 01/07/2019. Bilateral TBIs appear decreased compared to prior study on 01/07/2019.  Summary: Right: Resting right ankle-brachial index indicates critical limb ischemia. The right toe-brachial index is abnormal. Imaging performed in Numerous places in the Right Lower Extremity. No pulsatile flow seen in the Right PTA,ATA and Peroneal Artery. The Right Lower Extremity BPG appears to be occluded. The Right CFA is patent with Monophasic flow. Left: Resting left ankle-brachial index indicates severe left lower extremity arterial disease. The left toe-brachial index is abnormal.  *See table(s) above for measurements and observations.  Electronically signed by Festus BarrenJason Sophya Vanblarcom MD on 06/24/2019 at 8:45:43 AM.    Final     Assessment/Plan Hypertension blood pressure control important in reducing the progression of atherosclerotic disease. On appropriate oral medications.   Hyperlipidemia lipid control important in reducing the progression of atherosclerotic disease. Continue statin therapy Atherosclerosis of native arteries of extremity with intermittent claudication (HCC) ABIs today are 1.03 on the right and 0.53 on the left.  His bypass graft is widely  patent.  He is having worsening claudication symptoms on the left leg and is considering intervention, although at this time he would like to wait.  We will see him back in about 3 months.  He will continue his Eliquis and his other medications without change.  Contact our office with any problems in the interim.    Festus BarrenJason Josiephine Simao, MD  07/15/2019 4:21 PM    This note was created with Dragon medical transcription system.  Any errors from dictation are purely unintentional

## 2019-07-15 NOTE — Assessment & Plan Note (Signed)
ABIs today are 1.03 on the right and 0.53 on the left.  His bypass graft is widely patent.  He is having worsening claudication symptoms on the left leg and is considering intervention, although at this time he would like to wait.  We will see him back in about 3 months.  He will continue his Eliquis and his other medications without change.  Contact our office with any problems in the interim.

## 2019-09-09 ENCOUNTER — Other Ambulatory Visit: Payer: Self-pay

## 2019-09-09 ENCOUNTER — Ambulatory Visit (INDEPENDENT_AMBULATORY_CARE_PROVIDER_SITE_OTHER): Payer: BC Managed Care – PPO | Admitting: Podiatry

## 2019-09-09 DIAGNOSIS — B351 Tinea unguium: Secondary | ICD-10-CM | POA: Diagnosis not present

## 2019-09-09 DIAGNOSIS — L84 Corns and callosities: Secondary | ICD-10-CM | POA: Diagnosis not present

## 2019-09-09 DIAGNOSIS — Q666 Other congenital valgus deformities of feet: Secondary | ICD-10-CM

## 2019-09-09 MED ORDER — CICLOPIROX 8 % EX SOLN: Freq: Every day | CUTANEOUS | 0 refills | Status: DC

## 2019-09-10 ENCOUNTER — Encounter: Payer: Self-pay | Admitting: Podiatry

## 2019-09-10 NOTE — Progress Notes (Signed)
Subjective:  Patient ID: Travis Salas, male    DOB: 07-Feb-1956,  MRN: [id number]  Chief Complaint  Patient presents with  . Foot Pain    pt is here for left foot painful lesion to the left foot, pt states that the left foot pain has been going on for a couple of months, pt is also concerned about a painful callus to the bottom of the left heel, pt states that he recently stepped on a belt to the bottom of his left heel.    64 y.o. male presents with the above complaint.  Patient presents with concern for left hallux previous ulcerative lesion that has been going on for about 3 to 6 months.  Patient states is painful to walk on.  There is burning sensation to the patient.  Patient has tried soaking it with Epson salt but has not helped.  He has also tried shaving it down himself.  He has had previous partial amputation of the digit from a lawnmower injury long time ago since then has healed completely.  However over time he has been putting excessive pressure to the medial aspect of the interphalangeal joint of the hallux of the left foot.  He would like to know if there is anything that could be done for this.  He has secondary complaint of right hallux toenail fungus as well.  It has been going on for greater than 1 year.  Patient states that he has tried couple of different things but has not helped.  He would like to discuss fungus treatment options.   Review of Systems: Negative except as noted in the HPI. Denies N/V/F/Ch.  Past Medical History:  Diagnosis Date  . Collagen vascular disease ([place])   . Hyperlipidemia   . Hypertension   . Peripheral vascular disease ([place])     Current Outpatient Medications:  .  amLODipine-benazepril (LOTREL) 5-20 MG capsule, Take 1 capsule by mouth 2 (two) times daily. , Disp: , Rfl: 5 .  apixaban (ELIQUIS) 5 MG TABS tablet, Take 1 tablet (5 mg total) by mouth 2 (two) times daily., Disp: 60 tablet, Rfl: 5 .  aspirin EC 81 MG EC tablet, Take 1 tablet (81  mg total) by mouth daily., Disp: , Rfl:  .  furosemide (LASIX) 20 MG tablet, TK 1 T PO QD PRN., Disp: , Rfl: 2 .  gabapentin (NEURONTIN) 300 MG capsule, TAKE 1 CAPSULE BY MOUTH AT BEDTIME, Disp: 60 capsule, Rfl: 0 .  Multiple Vitamin (MULTIVITAMIN WITH MINERALS) TABS tablet, Take 1 tablet by mouth every evening., Disp: , Rfl:  .  Omega-3 Fatty Acids (FISH OIL) 1200 MG CAPS, Take 2,400 mg by mouth 2 (two) times daily., Disp: , Rfl:  .  oxyCODONE (OXY IR/ROXICODONE) 5 MG immediate release tablet, Take 1 tablet (5 mg total) by mouth every 4 (four) hours as needed for moderate pain., Disp: 30 tablet, Rfl: 0 .  rosuvastatin (CRESTOR) 20 MG tablet, Take 20 mg by mouth daily., Disp: , Rfl:  .  sertraline (ZOLOFT) 100 MG tablet, TK 1 T PO QD, Disp: , Rfl: 3 .  Specialty Vitamins Products (ONE-A-DAY PROSTATE PO), Take 1 tablet by mouth daily. "Super Beta Prostate", Disp: , Rfl:  .  tamsulosin (FLOMAX) 0.4 MG CAPS capsule, Take 0.4 mg by mouth daily., Disp: , Rfl:  .  Zn-Pyg Afri-Nettle-Saw Palmet (SAW PALMETTO COMPLEX PO), Take 2 tablets by mouth daily., Disp: , Rfl:  .  ciclopirox (PENLAC) 8 % solution, Apply topically at  bedtime. Apply over nail and surrounding skin. Apply daily over previous coat. After seven (7) days, may remove with alcohol and continue cycle., Disp: 6.6 mL, Rfl: 0  Social History   Tobacco Use  Smoking Status Former Smoker  . Packs/day: 1.50  . Years: 46.00  . Pack years: 69.00  . Types: Cigarettes  . Quit date: 02/05/2015  . Years since quitting: 4.5  Smokeless Tobacco Never Used    Allergies  Allergen Reactions  . Hydrocodone Swelling   Objective:  There were no vitals filed for this visit. There is no height or weight on file to calculate BMI. Constitutional Well developed. Well nourished.  Vascular Dorsalis pedis pulses palpable bilaterally. Posterior tibial pulses palpable bilaterally. Capillary refill normal to all digits.  No cyanosis or clubbing  noted. Pedal hair growth normal.  Neurologic Normal speech. Oriented to person, place, and time. Epicritic sensation to light touch grossly present bilaterally.  Dermatologic  right hallux thickened elongated mycotic dystrophic toenails x1.  Mild pain on palpation No open wounds. No skin lesions.  Orthopedic:  No pain on palpation to the left hallux preulcerative lesion.  After debridement no pinpoint bleeding noted.  Appears to be hyperkeratotic in nature.  No underlying ulceration noted.  No clinical signs of infection noted.  No erythema noted.   Radiographs: None Assessment:   1. Pes planovalgus   2. Pre-ulcerative calluses   3. Onychomycosis    Plan:  Patient was evaluated and treated and all questions answered.  Left hallux preulcerative lesion -I explained to the patient the etiology of preulcerative lesion and various treatment options were extensively discussed.  I believe patient will benefit from aggressive offloading of the lesion with a wider shoe gear modification.  I did explain to the patient the ultimately this has to do with the mechanics of his foot and how he is pushing off of the big toe as opposed to the first metatarsophalangeal joint.  Patient states understanding.  I believe he will benefit from orthotics management  Rigid pes planus -I explained to the patient the etiology of pes planus and various treatment options were extensively discussed.  Given that he has rigid pes planus with IPJ hyperkeratotic lesion I believe he will benefit from aggressive offloading of the lesion with control of the hindfoot as well as support of the arch.  He will benefit from custom-made orthotics and plan on offloading the hallux IPJ of the left foot -He is scheduled to see Raiford Noble for custom-made orthotics  Right hallux onychomycosis -Educated the patient on the etiology of onychomycosis and various treatment options associated with improving the fungal load.  I explained to the  patient that there is 3 treatment options available to treat the onychomycosis including topical, p.o., laser treatment.  Patient has elected to undergo topical application as he does not want any complication with liver with Lamisil therapy. -Penlac was sent into pharmacy.   No follow-ups on file.

## 2019-10-15 ENCOUNTER — Ambulatory Visit (INDEPENDENT_AMBULATORY_CARE_PROVIDER_SITE_OTHER): Payer: BC Managed Care – PPO | Admitting: Orthotics

## 2019-10-15 ENCOUNTER — Other Ambulatory Visit: Payer: Self-pay

## 2019-10-15 DIAGNOSIS — L84 Corns and callosities: Secondary | ICD-10-CM

## 2019-10-15 DIAGNOSIS — Q666 Other congenital valgus deformities of feet: Secondary | ICD-10-CM

## 2019-10-15 NOTE — Progress Notes (Signed)
Patient cast today for CMFO to address preuclerative callus LEFT, plan on deep heel seat f/o with p-cell cover and heel punch b/l

## 2019-10-21 ENCOUNTER — Other Ambulatory Visit: Payer: Self-pay

## 2019-10-21 ENCOUNTER — Other Ambulatory Visit: Payer: Self-pay | Admitting: Podiatry

## 2019-10-21 ENCOUNTER — Encounter (INDEPENDENT_AMBULATORY_CARE_PROVIDER_SITE_OTHER): Payer: Self-pay | Admitting: Vascular Surgery

## 2019-10-21 ENCOUNTER — Ambulatory Visit (INDEPENDENT_AMBULATORY_CARE_PROVIDER_SITE_OTHER): Payer: BC Managed Care – PPO | Admitting: Vascular Surgery

## 2019-10-21 ENCOUNTER — Ambulatory Visit (INDEPENDENT_AMBULATORY_CARE_PROVIDER_SITE_OTHER): Payer: BC Managed Care – PPO

## 2019-10-21 VITALS — BP 130/74 | HR 73 | Resp 16 | Wt 160.0 lb

## 2019-10-21 DIAGNOSIS — I70213 Atherosclerosis of native arteries of extremities with intermittent claudication, bilateral legs: Secondary | ICD-10-CM | POA: Diagnosis not present

## 2019-10-21 DIAGNOSIS — M7989 Other specified soft tissue disorders: Secondary | ICD-10-CM

## 2019-10-21 DIAGNOSIS — I10 Essential (primary) hypertension: Secondary | ICD-10-CM | POA: Diagnosis not present

## 2019-10-21 DIAGNOSIS — E785 Hyperlipidemia, unspecified: Secondary | ICD-10-CM | POA: Diagnosis not present

## 2019-10-21 NOTE — Progress Notes (Signed)
MRN : [id number]  Travis Salas is a 64 y.o. (1955-08-15) male who presents with chief complaint of  Chief Complaint  Patient presents with  . Follow-up    ultrasound follow up  .  History of Present Illness: Patient returns today in follow up of his peripheral arterial disease.  He continues to have claudication symptoms in the left leg that are at least moderate in nature.  These are similar to what they were at his last visit.  His right leg is doing well after extensive revascularization to salvage his bypass graft earlier this year.  His right ABI remains in the normal range at 1.04 on the right with triphasic waveforms.  Left ABI is stable at 0.52 with monophasic waveforms.  Right digital pressure is 127 with a left digital pressure of 61 which is stable.  Current Outpatient Medications  Medication Sig Dispense Refill  . amLODipine-benazepril (LOTREL) 5-20 MG capsule Take 1 capsule by mouth 2 (two) times daily.   5  . apixaban (ELIQUIS) 5 MG TABS tablet Take 1 tablet (5 mg total) by mouth 2 (two) times daily. 60 tablet 5  . ciclopirox (PENLAC) 8 % solution Apply topically at bedtime. Apply over nail and surrounding skin. Apply daily over previous coat. After seven (7) days, may remove with alcohol and continue cycle. 6.6 mL 0  . furosemide (LASIX) 20 MG tablet TK 1 T PO QD PRN.  2  . gabapentin (NEURONTIN) 300 MG capsule TAKE 1 CAPSULE BY MOUTH AT BEDTIME 60 capsule 0  . Multiple Vitamin (MULTIVITAMIN WITH MINERALS) TABS tablet Take 1 tablet by mouth every evening.    . Omega-3 Fatty Acids (FISH OIL) 1200 MG CAPS Take 2,400 mg by mouth 2 (two) times daily.    . rosuvastatin (CRESTOR) 20 MG tablet Take 20 mg by mouth daily.    . sertraline (ZOLOFT) 100 MG tablet TK 1 T PO QD  3  . Specialty Vitamins Products (ONE-A-DAY PROSTATE PO) Take 1 tablet by mouth daily. "Super Beta Prostate"    . tamsulosin (FLOMAX) 0.4 MG CAPS capsule Take 0.4 mg by mouth daily.    [name] aspirin EC 81 MG EC  tablet Take 1 tablet (81 mg total) by mouth daily. (Patient not taking: Reported on 10/21/2019)    . oxyCODONE (OXY IR/ROXICODONE) 5 MG immediate release tablet Take 1 tablet (5 mg total) by mouth every 4 (four) hours as needed for moderate pain. (Patient not taking: Reported on 10/21/2019) 30 tablet 0  . Zn-Pyg Afri-Nettle-Saw Palmet (SAW PALMETTO COMPLEX PO) Take 2 tablets by mouth daily.     No current facility-administered medications for this visit.    Past Medical History:  Diagnosis Date  . Collagen vascular disease (HCC)   . Hyperlipidemia   . Hypertension   . Peripheral vascular disease [facility])     Past Surgical History:  Procedure Laterality Date  . ENDARTERECTOMY FEMORAL Right 12/23/2015   Procedure: COMMON, FEMORAL, SUPERFICIAL, AND PROFUNDA ENDARTERECTOMY;  Surgeon: Annice Needy, MD;  Location: [facility] ORS;  Service: Vascular;  Laterality: Right;  . FEMORAL-TIBIAL BYPASS GRAFT Right 12/23/2015   Procedure: BYPASS GRAFT FEMORAL-TIBIAL ARTERY, COMMON FEMORAL AND PROFUNDIS FEMORAL WITH PATCH ANGIOPLASTY, POPLITEAL ANTERIOR ARTERY BYPASS, ILIOFEMORAL ANGIOGRAM VIABHAM STENT 8 MM DIAMETER X 5 CM LENGTH;  Surgeon: Annice Needy, MD;  Location: [facility] ORS;  Service: Vascular;  Laterality: Right;  . LOWER EXTREMITY ANGIOGRAPHY Right 06/19/2019   Procedure: LOWER EXTREMITY ANGIOGRAPHY;  Surgeon: Annice Needy, MD;  Location: [place] [facility];  Service: Cardiovascular;  Laterality: Right;  . LOWER EXTREMITY ANGIOGRAPHY Right 06/20/2019   Procedure: Lower Extremity Angiography;  Surgeon: Algernon Huxley, MD;  Location: [place] [facility];  Service: Cardiovascular;  Laterality: Right;  . PERIPHERAL VASCULAR CATHETERIZATION Right 11/08/2015   Procedure: Lower Extremity Angiography;  Surgeon: Algernon Huxley, MD;  Location: [place] [facility];  Service: Cardiovascular;  Laterality: Right;  . PERIPHERAL VASCULAR CATHETERIZATION Right 12/13/2015   Procedure: Lower Extremity Angiography;  Surgeon: Algernon Huxley, MD;   Location: [place] [facility];  Service: Cardiovascular;  Laterality: Right;     Social History   Tobacco Use  . Smoking status: Former Smoker    Packs/day: 1.50    Years: 46.00    Pack years: 69.00    Types: Cigarettes    Quit date: 02/05/2015    Years since quitting: 4.7  . Smokeless tobacco: Never Used  Substance Use Topics  . Alcohol use: Yes    Alcohol/week: 7.0 standard drinks    Types: 7 Cans of beer per week  . Drug use: Yes    Types: Marijuana    Comment: occasional      Family History  Problem Relation Age of Onset  . Diabetes Mother   . Hypertension Brother   PAD in his brother.  Leg swelling in his mother.  Allergies  Allergen Reactions  . Hydrocodone Swelling    REVIEW OF SYSTEMS(Negative unless checked)  Constitutional: [] ????Weight loss[] ????Fever[] ????Chills Cardiac:[] ????Chest pain[] ????Chest pressure[] ????Palpitations [] ????Shortness of breath when laying flat [] ????Shortness of breath at rest [] ????Shortness of breath with exertion. Vascular: [x] ????Pain in legs with walking[] ????Pain in legsat rest[] ????Pain in legs when laying flat [x] ????Claudication [] ????Pain in feet when walking [] ????Pain in feet at rest [] ????Pain in feet when laying flat [] ????History of DVT [] ????Phlebitis [x] ????Swelling in legs [] ????Varicose veins [] ????Non-healing ulcers Pulmonary: [] ????Uses home oxygen [] ????Productive cough[] ????Hemoptysis [] ????Wheeze [] ????COPD [] ????Asthma Neurologic: [] ????Dizziness [] ????Blackouts [] ????Seizures [] ????History of stroke [] ????History of TIA[] ????Aphasia [] ????Temporary blindness[] ????Dysphagia [] ????Weaknessor numbness in arms [x] ????Weakness or numbnessin legs Musculoskeletal: [] ????Arthritis [] ????Joint swelling [] ????Joint pain [] ????Low back pain Hematologic:[] ????Easy bruising[] ????Easy bleeding [] ????Hypercoagulable state [] ????Anemic   Gastrointestinal:[] ????Blood in stool[] ????Vomiting blood[] ????Gastroesophageal reflux/heartburn[] ????Abdominal pain Genitourinary: [] ????Chronic kidney disease [] ????Difficulturination [] ????Frequenturination [] ????Burning with urination[] ????Hematuria Skin: [] ????Rashes [] ????Ulcers [] ????Wounds Psychological: [] ????History of anxiety[] ????History of major depression.  Physical Examination  BP 130/74 (BP Location: Right Arm)   Pulse 73   Resp 16   Wt 160 lb (72.6 kg)   BMI 22.96 kg/m  Gen:  WD/WN, NAD Head: [place]/AT, No temporalis wasting. Ear/Nose/Throat: Hearing grossly intact, nares w/o erythema or drainage Eyes: Conjunctiva clear. Sclera non-icteric Neck: Supple.  Trachea midline Pulmonary:  Good air movement, no use of accessory muscles.  Cardiac: RRR, no JVD Vascular:  Vessel Right Left  Radial Palpable Palpable                          PT  1+ palpable  trace palpable  DP  2+ palpable  1+ palpable    Musculoskeletal: M/S 5/5 throughout.  No deformity or atrophy.  1+ right lower extremity edema. Neurologic: Sensation grossly intact in extremities.  Symmetrical.  Speech is fluent.  Psychiatric: Judgment intact, Mood & affect appropriate for pt's clinical situation. Dermatologic: No rashes or ulcers noted.  No cellulitis or open wounds.       Labs No results found for this or any previous visit (from the past 2160 hour(s)).  Radiology No results found.  Assessment/Plan Hypertension blood pressure control important  in reducing the progression of atherosclerotic disease. On appropriate oral medications.   Hyperlipidemia lipid control important in reducing the progression of atherosclerotic disease. Continue statin therapy  Swelling of limb Stable to slightly improved from his last visit.  Continue compression and elevation as tolerated for symptom relief.  Atherosclerosis of native arteries of extremity with intermittent  claudication (HCC) His right ABI remains in the normal range at 1.04 on the right with triphasic waveforms.  Left ABI is stable at 0.52 with monophasic waveforms.  Right digital pressure is 127 with a left digital pressure of 61 which is stable.  At this time, he wants to continue medical management but is considering intervention to his left leg.  His right leg is doing well.  He was called to jury duty which I think he would have a very hard time with due to his short distance claudication.  I am in a plan to see him back in about 4 to 5 months with noninvasive studies or sooner if problems develop in the interim.    Festus Barren, MD  10/21/2019 4:17 PM    This note was created with Dragon medical transcription system.  Any errors from dictation are purely unintentional

## 2019-10-21 NOTE — Assessment & Plan Note (Signed)
His right ABI remains in the normal range at 1.04 on the right with triphasic waveforms.  Left ABI is stable at 0.52 with monophasic waveforms.  Right digital pressure is 127 with a left digital pressure of 61 which is stable.  At this time, he wants to continue medical management but is considering intervention to his left leg.  His right leg is doing well.  He was called to jury duty which I think he would have a very hard time with due to his short distance claudication.  I am in a plan to see him back in about 4 to 5 months with noninvasive studies or sooner if problems develop in the interim.

## 2019-10-21 NOTE — Patient Instructions (Signed)
Peripheral Vascular Disease  Peripheral vascular disease (PVD) is a disease of the blood vessels that are not part of your heart and brain. A simple term for PVD is poor circulation. In most cases, PVD narrows the blood vessels that carry blood from your heart to the rest of your body. This can reduce the supply of blood to your arms, legs, and internal organs, like your stomach or kidneys. However, PVD most often affects a person's lower legs and feet. Without treatment, PVD tends to get worse. PVD can also lead to acute ischemic limb. This is when an arm or leg suddenly cannot get enough blood. This is a medical emergency. Follow these instructions at home: Lifestyle  Do not use any products that contain nicotine or tobacco, such as cigarettes and e-cigarettes. If you need help quitting, ask your doctor.  Lose weight if you are overweight. Or, stay at a healthy weight as told by your doctor.  Eat a diet that is low in fat and cholesterol. If you need help, ask your doctor.  Exercise regularly. Ask your doctor for activities that are right for you. General instructions  Take over-the-counter and prescription medicines only as told by your doctor.  Take good care of your feet: ? Wear comfortable shoes that fit well. ? Check your feet often for any cuts or sores.  Keep all follow-up visits as told by your doctor This is important. Contact a doctor if:  You have cramps in your legs when you walk.  You have leg pain when you are at rest.  You have coldness in a leg or foot.  Your skin changes.  You are unable to get or have an erection (erectile dysfunction).  You have cuts or sores on your feet that do not heal. Get help right away if:  Your arm or leg turns cold, numb, and blue.  Your arms or legs become red, warm, swollen, painful, or numb.  You have chest pain.  You have trouble breathing.  You suddenly have weakness in your face, arm, or leg.  You become very  confused or you cannot speak.  You suddenly have a very bad headache.  You suddenly cannot see. Summary  Peripheral vascular disease (PVD) is a disease of the blood vessels.  A simple term for PVD is poor circulation. Without treatment, PVD tends to get worse.  Treatment may include exercise, low fat and low cholesterol diet, and quitting smoking. This information is not intended to replace advice given to you by your health care provider. Make sure you discuss any questions you have with your health care provider. Document Revised: 04/13/2017 Document Reviewed: 06/08/2016 Elsevier Patient Education  2020 [employer]  

## 2019-10-21 NOTE — Assessment & Plan Note (Signed)
Stable to slightly improved from his last visit.  Continue compression and elevation as tolerated for symptom relief.

## 2019-10-27 DIAGNOSIS — R972 Elevated prostate specific antigen [PSA]: Secondary | ICD-10-CM | POA: Diagnosis not present

## 2019-10-27 DIAGNOSIS — N401 Enlarged prostate with lower urinary tract symptoms: Secondary | ICD-10-CM | POA: Diagnosis not present

## 2019-11-10 ENCOUNTER — Ambulatory Visit: Payer: BC Managed Care – PPO | Admitting: Podiatry

## 2019-11-12 ENCOUNTER — Other Ambulatory Visit: Payer: Self-pay

## 2019-11-12 ENCOUNTER — Ambulatory Visit: Payer: BC Managed Care – PPO | Admitting: Orthotics

## 2019-11-12 DIAGNOSIS — L84 Corns and callosities: Secondary | ICD-10-CM

## 2019-11-12 DIAGNOSIS — Q666 Other congenital valgus deformities of feet: Secondary | ICD-10-CM

## 2019-11-12 DIAGNOSIS — B351 Tinea unguium: Secondary | ICD-10-CM

## 2019-11-12 NOTE — Progress Notes (Signed)
Patient came in today to pick up custom made foot orthotics.  The goals were accomplished and the patient reported no dissatisfaction with said orthotics.  Patient was advised of breakin period and how to report any issues. 

## 2019-11-20 ENCOUNTER — Encounter: Payer: Self-pay | Admitting: Podiatry

## 2019-11-20 ENCOUNTER — Ambulatory Visit (INDEPENDENT_AMBULATORY_CARE_PROVIDER_SITE_OTHER): Payer: BC Managed Care – PPO | Admitting: Podiatry

## 2019-11-20 ENCOUNTER — Other Ambulatory Visit: Payer: Self-pay

## 2019-11-20 DIAGNOSIS — L84 Corns and callosities: Secondary | ICD-10-CM

## 2019-11-20 DIAGNOSIS — B351 Tinea unguium: Secondary | ICD-10-CM | POA: Diagnosis not present

## 2019-11-20 DIAGNOSIS — Q666 Other congenital valgus deformities of feet: Secondary | ICD-10-CM | POA: Diagnosis not present

## 2019-11-21 ENCOUNTER — Other Ambulatory Visit: Payer: Self-pay

## 2019-11-21 ENCOUNTER — Inpatient Hospital Stay
Admission: EM | Admit: 2019-11-21 | Discharge: 2019-11-24 | DRG: 271 | Disposition: A | Discharge status: Home / Self Care | Payer: BC Managed Care – PPO | Attending: Internal Medicine | Admitting: Internal Medicine

## 2019-11-21 ENCOUNTER — Encounter: Payer: Self-pay | Admitting: *Deleted

## 2019-11-21 ENCOUNTER — Encounter
Admission: EM | Disposition: A | Discharge status: Home / Self Care | Payer: Self-pay | Source: Home / Self Care | Attending: Internal Medicine

## 2019-11-21 DIAGNOSIS — Z7902 Long term (current) use of antithrombotics/antiplatelets: Secondary | ICD-10-CM

## 2019-11-21 DIAGNOSIS — Z8249 Family history of ischemic heart disease and other diseases of the circulatory system: Secondary | ICD-10-CM

## 2019-11-21 DIAGNOSIS — Z885 Allergy status to narcotic agent status: Secondary | ICD-10-CM

## 2019-11-21 DIAGNOSIS — Z87891 Personal history of nicotine dependence: Secondary | ICD-10-CM

## 2019-11-21 DIAGNOSIS — N400 Enlarged prostate without lower urinary tract symptoms: Secondary | ICD-10-CM | POA: Diagnosis not present

## 2019-11-21 DIAGNOSIS — Z79899 Other long term (current) drug therapy: Secondary | ICD-10-CM

## 2019-11-21 DIAGNOSIS — I70221 Atherosclerosis of native arteries of extremities with rest pain, right leg: Principal | ICD-10-CM | POA: Diagnosis present

## 2019-11-21 DIAGNOSIS — I743 Embolism and thrombosis of arteries of the lower extremities: Secondary | ICD-10-CM | POA: Diagnosis present

## 2019-11-21 DIAGNOSIS — Z7982 Long term (current) use of aspirin: Secondary | ICD-10-CM | POA: Diagnosis not present

## 2019-11-21 DIAGNOSIS — I708 Atherosclerosis of other arteries: Secondary | ICD-10-CM | POA: Diagnosis not present

## 2019-11-21 DIAGNOSIS — I10 Essential (primary) hypertension: Secondary | ICD-10-CM | POA: Diagnosis not present

## 2019-11-21 DIAGNOSIS — R310 Gross hematuria: Secondary | ICD-10-CM | POA: Diagnosis not present

## 2019-11-21 DIAGNOSIS — Z20822 Contact with and (suspected) exposure to covid-19: Secondary | ICD-10-CM | POA: Diagnosis present

## 2019-11-21 DIAGNOSIS — F1010 Alcohol abuse, uncomplicated: Secondary | ICD-10-CM | POA: Diagnosis not present

## 2019-11-21 DIAGNOSIS — E785 Hyperlipidemia, unspecified: Secondary | ICD-10-CM | POA: Diagnosis not present

## 2019-11-21 DIAGNOSIS — Z7901 Long term (current) use of anticoagulants: Secondary | ICD-10-CM

## 2019-11-21 DIAGNOSIS — I998 Other disorder of circulatory system: Secondary | ICD-10-CM | POA: Diagnosis not present

## 2019-11-21 HISTORY — PX: BYPASS GRAFT ANGIOGRAPHY: CATH118229

## 2019-11-21 HISTORY — PX: PERIPHERAL VASCULAR THROMBECTOMY: CATH118306

## 2019-11-21 LAB — BASIC METABOLIC PANEL
Anion gap: 13 (ref 5–15)
BUN: 18 mg/dL (ref 8–23)
CO2: 25 mmol/L (ref 22–32)
Calcium: 9.7 mg/dL (ref 8.9–10.3)
Chloride: 97 mmol/L — ABNORMAL LOW (ref 98–111)
Creatinine, Ser: 1.13 mg/dL (ref 0.61–1.24)
GFR calc Af Amer: >60 mL/min (ref >60)
GFR calc non Af Amer: >60 mL/min (ref >60)
Glucose, Bld: 150 mg/dL — ABNORMAL HIGH (ref 70–99)
Potassium: 3.9 mmol/L (ref 3.5–5.1)
Sodium: 135 mmol/L (ref 135–145)

## 2019-11-21 LAB — CBC WITH DIFFERENTIAL/PLATELET
Abs Immature Granulocytes: 0.07 10*3/uL (ref 0.00–0.07)
Basophils Absolute: 0.1 10*3/uL (ref 0.0–0.1)
Basophils Relative: 0 %
Eosinophils Absolute: 0 10*3/uL (ref 0.0–0.5)
Eosinophils Relative: 0 %
HCT: 40.1 % (ref 39.0–52.0)
Hemoglobin: 13.8 g/dL (ref 13.0–17.0)
Immature Granulocytes: 1 %
Lymphocytes Relative: 8 %
Lymphs Abs: 1 10*3/uL (ref 0.7–4.0)
MCH: 31.4 pg (ref 26.0–34.0)
MCHC: 34.4 g/dL (ref 30.0–36.0)
MCV: 91.1 fL (ref 80.0–100.0)
Monocytes Absolute: 0.7 10*3/uL (ref 0.1–1.0)
Monocytes Relative: 6 %
Neutro Abs: 10.2 10*3/uL — ABNORMAL HIGH (ref 1.7–7.7)
Neutrophils Relative %: 85 %
Platelets: 180 10*3/uL (ref 150–400)
RBC: 4.4 MIL/uL (ref 4.22–5.81)
RDW: 14.1 % (ref 11.5–15.5)
WBC: 12.1 10*3/uL — ABNORMAL HIGH (ref 4.0–10.5)
nRBC: 0 % (ref 0.0–0.2)

## 2019-11-21 LAB — PROTIME-INR
INR: 1 (ref 0.8–1.2)
Prothrombin Time: 12.7 seconds (ref 11.4–15.2)

## 2019-11-21 LAB — SARS CORONAVIRUS 2 BY RT PCR (HOSPITAL ORDER, PERFORMED IN ~~LOC~~ HOSPITAL LAB): SARS Coronavirus 2: NEGATIVE

## 2019-11-21 SURGERY — BYPASS GRAFT ANGIOGRAPHY
Anesthesia: Moderate Sedation | Laterality: Right

## 2019-11-21 MED ORDER — HEPARIN SODIUM (PORCINE) 1000 UNIT/ML IJ SOLN
INTRAMUSCULAR | Status: AC
  Filled 2019-11-21: qty 1

## 2019-11-21 MED ORDER — CEFAZOLIN SODIUM-DEXTROSE 2-4 GM/100ML-% IV SOLN
INTRAVENOUS | Status: AC
  Administered 2019-11-21: 2 g
  Filled 2019-11-21: qty 100

## 2019-11-21 MED ORDER — HEPARIN SODIUM (PORCINE) 1000 UNIT/ML IJ SOLN
INTRAMUSCULAR | Status: DC | PRN
  Administered 2019-11-21: 7000 [IU] via INTRAVENOUS
  Administered 2019-11-21 – 2019-11-22 (×2): 4000 [IU] via INTRAVENOUS

## 2019-11-21 MED ORDER — MIDAZOLAM HCL 2 MG/2ML IJ SOLN
INTRAMUSCULAR | Status: DC | PRN
  Administered 2019-11-21 (×4): 1 mg via INTRAVENOUS

## 2019-11-21 MED ORDER — MIDAZOLAM HCL 2 MG/2ML IJ SOLN
INTRAMUSCULAR | Status: AC
  Filled 2019-11-21: qty 2

## 2019-11-21 MED ORDER — HYDRALAZINE HCL 20 MG/ML IJ SOLN
INTRAMUSCULAR | Status: AC
  Filled 2019-11-21: qty 1

## 2019-11-21 MED ORDER — NITROGLYCERIN 1 MG/10 ML FOR IR/CATH LAB
INTRA_ARTERIAL | Status: DC | PRN
  Administered 2019-11-21 – 2019-11-22 (×8): 100 ug

## 2019-11-21 MED ORDER — ALTEPLASE 2 MG IJ SOLR
INTRAMUSCULAR | Status: AC
  Filled 2019-11-21: qty 14

## 2019-11-21 MED ORDER — ALTEPLASE 2 MG IJ SOLR
INTRAMUSCULAR | Status: DC | PRN
  Administered 2019-11-21: 10 mg
  Administered 2019-11-21 (×4): 2 mg
  Administered 2019-11-21: 10 mg

## 2019-11-21 MED ORDER — HYDRALAZINE HCL 20 MG/ML IJ SOLN
INTRAMUSCULAR | Status: DC | PRN
  Administered 2019-11-21: 10 mg via INTRAVENOUS

## 2019-11-21 MED ORDER — CEFAZOLIN SODIUM-DEXTROSE 2-4 GM/100ML-% IV SOLN: 2.0000 g | Freq: Once | INTRAVENOUS | Status: DC

## 2019-11-21 MED ORDER — FENTANYL CITRATE (PF) 100 MCG/2ML IJ SOLN
INTRAMUSCULAR | Status: AC
  Filled 2019-11-21: qty 2

## 2019-11-21 MED ORDER — SODIUM CHLORIDE 0.9 % IV SOLN: INTRAVENOUS | Status: DC

## 2019-11-21 SURGICAL SUPPLY — 26 items
BALLN ULTRVRSE 2.5X150X150 (BALLOONS) ×3
BALLN ULTRVRSE 2X40X150 (BALLOONS) ×3
BALLN ULTRVRSE 6X300X130 (BALLOONS) ×3
BALLOON ULTRVRSE 2.5X150X150 (BALLOONS) ×1 IMPLANT
BALLOON ULTRVRSE 2X40X150 (BALLOONS) ×1 IMPLANT
BALLOON ULTRVRSE 6X300X130 (BALLOONS) ×1 IMPLANT
CANNULA 5F STIFF (CANNULA) ×3 IMPLANT
CATH BEACON 5 .035 65 RIM TIP (CATHETERS) ×3 IMPLANT
CATH INFUSION CRAGGM 135X50 5F (CATHETERS) ×3 IMPLANT
CATH SEEKER .035X150CM (CATHETERS) ×3 IMPLANT
CATH VERT 5FR 125CM (CATHETERS) ×3 IMPLANT
DEVICE PRESTO INFLATION (MISCELLANEOUS) ×3 IMPLANT
DEVICE SOLENT OMNI 120CM (CATHETERS) ×3 IMPLANT
DEVICE TORQUE .025-.038 (MISCELLANEOUS) ×3 IMPLANT
GLIDEWIRE ANGLED SS 035X260CM (WIRE) ×3 IMPLANT
GUIDEWIRE ANGLED .035X260CM (WIRE) ×3 IMPLANT
KIT CV MULTILUMEN 7FR 20 (SET/KITS/TRAYS/PACK) ×3
KIT CV MULTILUMEN 7FR 20 SUB (SET/KITS/TRAYS/PACK) ×1 IMPLANT
PACK ANGIOGRAPHY (CUSTOM PROCEDURE TRAY) ×3 IMPLANT
SHEATH BRITE TIP 5FRX11 (SHEATH) ×3 IMPLANT
SHEATH PINNACLE ST 6F 45CM (SHEATH) ×3 IMPLANT
SYR MEDRAD MARK 7 150ML (SYRINGE) ×3 IMPLANT
TUBING CONTRAST HIGH PRESS 72 (TUBING) ×6 IMPLANT
VALVE HEMO TOUHY BORST Y (ADAPTER) ×3 IMPLANT
WIRE J 3MM .035X145CM (WIRE) ×3 IMPLANT
WIRE SPARTACORE .014X300CM (WIRE) ×3 IMPLANT

## 2019-11-21 NOTE — OR Nursing (Signed)
Pt arrived in ed reporting severe pain right leg. Leg cool and upon arrival to vascular lab unable to doppler right pedal pulses. Pt reporting eating dinner at 18:30. Dr Consuello Closs discussed with pt that he couldn't get moderate sedation  But he could get versed intraprocedurally. Pt prepped, ancef 2 G,

## 2019-11-21 NOTE — ED Triage Notes (Signed)
Pt's RLE is cooler than LLE. Pt's DP is palpable 1+ and Post Tib is barely palpable. Pt's R foot is pale.

## 2019-11-21 NOTE — ED Triage Notes (Signed)
Pt presents w PMH of claudation in RLE. Pt is experiencing coldness and pain in R foot similar to the last time he had los of blood flow to R foot. Pt has stopped his eliquis x 4 days because he is preparing for a procedure on 11/25/19. Pt states his foot started getting cold at 1600 today.

## 2019-11-21 NOTE — ED Provider Notes (Signed)
[facility] Emergency Department Provider Note   ____________________________________________   First MD Initiated Contact with Patient 11/21/19 2121     (approximate)  I have reviewed the triage vital signs and the nursing notes.   HISTORY  Chief Complaint Claudication    HPI Travis Salas is a 64 y.o. male with past medical history of hypertension, hyperlipidemia, and peripheral arterial disease who presents to the ED complaining of foot pain. Patient reports he has been off his Eliquis for the past 4 days in preparation for a prostate biopsy. He started noticing increasing pain and pale color to his right foot around 4 PM this afternoon. He has a cold sensation to that foot similar to when he had loss of blood flow to it previously. He spoke with his vascular surgeon over the phone, who advised to be seen in the ED immediately.        Past Medical History:  Diagnosis Date  . Collagen vascular disease (HCC)   . Hyperlipidemia   . Hypertension   . Peripheral vascular disease [facility])     Patient Active Problem List   Diagnosis Date Noted  . Limb ischemia 06/19/2019  . Hypertension 12/20/2016  . Hyperlipidemia 06/13/2016  . Swelling of limb 06/13/2016  . Atherosclerosis of native arteries of extremity with intermittent claudication (HCC) 06/13/2016  . Ischemic leg 12/13/2015    Past Surgical History:  Procedure Laterality Date  . ENDARTERECTOMY FEMORAL Right 12/23/2015   Procedure: COMMON, FEMORAL, SUPERFICIAL, AND PROFUNDA ENDARTERECTOMY;  Surgeon: Annice Needy, MD;  Location: [facility] ORS;  Service: Vascular;  Laterality: Right;  . FEMORAL-TIBIAL BYPASS GRAFT Right 12/23/2015   Procedure: BYPASS GRAFT FEMORAL-TIBIAL ARTERY, COMMON FEMORAL AND PROFUNDIS FEMORAL WITH PATCH ANGIOPLASTY, POPLITEAL ANTERIOR ARTERY BYPASS, ILIOFEMORAL ANGIOGRAM VIABHAM STENT 8 MM DIAMETER X 5 CM LENGTH;  Surgeon: Annice Needy, MD;  Location: [facility] ORS;  Service: Vascular;   Laterality: Right;  . LOWER EXTREMITY ANGIOGRAPHY Right 06/19/2019   Procedure: LOWER EXTREMITY ANGIOGRAPHY;  Surgeon: Annice Needy, MD;  Location: [facility] INVASIVE CV LAB;  Service: Cardiovascular;  Laterality: Right;  . LOWER EXTREMITY ANGIOGRAPHY Right 06/20/2019   Procedure: Lower Extremity Angiography;  Surgeon: Annice Needy, MD;  Location: [facility] INVASIVE CV LAB;  Service: Cardiovascular;  Laterality: Right;  . PERIPHERAL VASCULAR CATHETERIZATION Right 11/08/2015   Procedure: Lower Extremity Angiography;  Surgeon: Annice Needy, MD;  Location: [facility] INVASIVE CV LAB;  Service: Cardiovascular;  Laterality: Right;  . PERIPHERAL VASCULAR CATHETERIZATION Right 12/13/2015   Procedure: Lower Extremity Angiography;  Surgeon: Annice Needy, MD;  Location: [facility] INVASIVE CV LAB;  Service: Cardiovascular;  Laterality: Right;    Prior to Admission medications   Medication Sig Start Date End Date Taking? Authorizing Provider  amLODipine-benazepril (LOTREL) 5-20 MG capsule Take 1 capsule by mouth 2 (two) times daily.  09/16/15   [provider]  apixaban (ELIQUIS) 5 MG TABS tablet Take 1 tablet (5 mg total) by mouth 2 (two) times daily. 06/21/19   Nada Libman, MD  aspirin EC 81 MG EC tablet Take 1 tablet (81 mg total) by mouth daily. Patient not taking: Reported on 10/21/2019 12/14/15   Stegmayer, Cala Bradford A, PA-C  ciclopirox (PENLAC) 8 % solution APPLY OVER NAIL AND SURROUNDING SKIN DAILY AT BEDTIME OVER PREVIOUS COAT. AFTER 7 DAYS, MAY REMOVE WITH ALCOHOL AND CONTINUE CYCLE 10/24/19   Candelaria Stagers, DPM  furosemide (LASIX) 20 MG tablet TK 1 T PO QD PRN. 12/04/16  [provider]  gabapentin (NEURONTIN) 300 MG capsule TAKE 1 CAPSULE BY MOUTH AT BEDTIME 11/06/16   Stegmayer, Cala Bradford A, PA-C  Multiple Vitamin (MULTIVITAMIN WITH MINERALS) TABS tablet Take 1 tablet by mouth every evening.    [provider]  Omega-3 Fatty Acids (FISH OIL) 1200 MG CAPS Take 2,400 mg by mouth 2 (two) times daily.     [provider]  oxyCODONE (OXY IR/ROXICODONE) 5 MG immediate release tablet Take 1 tablet (5 mg total) by mouth every 4 (four) hours as needed for moderate pain. Patient not taking: Reported on 10/21/2019 12/29/15   Annice Needy, MD  rosuvastatin (CRESTOR) 20 MG tablet Take 20 mg by mouth daily.    [provider]  sertraline (ZOLOFT) 100 MG tablet TK 1 T PO QD 11/15/17   [provider]  Specialty Vitamins Products (ONE-A-DAY PROSTATE PO) Take 1 tablet by mouth daily. "Super Beta Prostate"    [provider]  tamsulosin (FLOMAX) 0.4 MG CAPS capsule Take 0.4 mg by mouth daily. 06/05/19   [provider]  Zn-Pyg Afri-Nettle-Saw Palmet (SAW PALMETTO COMPLEX PO) Take 2 tablets by mouth daily.    [provider]    Allergies Hydrocodone  Family History  Problem Relation Age of Onset  . Diabetes Mother   . Hypertension Brother     Social History Social History   Tobacco Use  . Smoking status: Former Smoker    Packs/day: 1.50    Years: 46.00    Pack years: 69.00    Types: Cigarettes    Quit date: 02/05/2015    Years since quitting: 4.7  . Smokeless tobacco: Never Used  Vaping Use  . Vaping Use: Never used  Substance Use Topics  . Alcohol use: Yes    Alcohol/week: 7.0 standard drinks    Types: 7 Cans of beer per week  . Drug use: Yes    Types: Marijuana    Comment: occasional    Review of Systems  Constitutional: No fever/chills Eyes: No visual changes. ENT: No sore throat. Cardiovascular: Denies chest pain. Respiratory: Denies shortness of breath. Gastrointestinal: No abdominal pain.  No nausea, no vomiting.  No diarrhea.  No constipation. Genitourinary: Negative for dysuria. Musculoskeletal: Negative for back pain. Positive for right foot pain and coolness. Skin: Negative for rash. Neurological: Negative for headaches, focal weakness or numbness.  ____________________________________________   PHYSICAL EXAM:  VITAL  SIGNS: ED Triage Vitals  Enc Vitals Group     BP 11/21/19 2051 (!) 156/75     Pulse Rate 11/21/19 2051 81     Resp 11/21/19 2051 20     Temp 11/21/19 2051 98.8 F (37.1 C)     Temp Source 11/21/19 2051 Oral     SpO2 11/21/19 2051 95 %     Weight 11/21/19 2053 160 lb (72.6 kg)     Height 11/21/19 2053 5\' 8"  (1.727 m)     Head Circumference --      Peak Flow --      Pain Score 11/21/19 2052 4     Pain Loc --      Pain Edu? --      Excl. in GC? --     Constitutional: Alert and oriented. Eyes: Conjunctivae are normal. Head: Atraumatic. Nose: No congestion/rhinnorhea. Mouth/Throat: Mucous membranes are moist. Neck: Normal ROM Cardiovascular: Normal rate, regular rhythm. Grossly normal heart sounds. Respiratory: Normal respiratory effort.  No retractions. Lungs CTAB. Gastrointestinal: Soft and nontender. No distention. Genitourinary: deferred Musculoskeletal:  Pale and cool right lower extremity from distal lower leg downward. Nonpalpable DP or PT pulses. Left lower extremity warm and well-perfused, 2+ DP pulse. Neurologic:  Normal speech and language. No gross focal neurologic deficits are appreciated. Skin:  Skin is warm, dry and intact. No rash noted. Psychiatric: Mood and affect are normal. Speech and behavior are normal.  ____________________________________________   LABS (all labs ordered are listed, but only abnormal results are displayed)  Labs Reviewed  CBC WITH DIFFERENTIAL/PLATELET - Abnormal; Notable for the following components:      Result Value   WBC 12.1 (*)    Neutro Abs 10.2 (*)    All other components within normal limits  BASIC METABOLIC PANEL - Abnormal; Notable for the following components:   Chloride 97 (*)    Glucose, Bld 150 (*)    All other components within normal limits  SARS CORONAVIRUS 2 BY RT PCR (HOSPITAL ORDER, PERFORMED IN [place] [facility])  PROTIME-INR  TYPE AND SCREEN  TYPE AND SCREEN    ____________________________________________  EKG  ED ECG REPORT I, Chesley Noon, the attending physician, personally viewed and interpreted this ECG.   Date: 11/21/2019  EKG Time: 21:38  Rate: 75  Rhythm: normal sinus rhythm  Axis: Normal  Intervals:none  ST&T Change: None   PROCEDURES  Procedure(s) performed (including Critical Care):  Procedures   ____________________________________________   INITIAL IMPRESSION / ASSESSMENT AND PLAN / ED COURSE       64 year old male with past medical history of hypertension, hyperlipidemia, and peripheral arterial disease presents to the ED with pale and cool right lower extremity since 4 PM this evening. His right lower extremity appears ischemic on my evaluation with nonpalpable DP pulse, likely due to patient stopping his anticoagulation in anticipation of procedure. Dr. Consuello Closs of vascular surgery is at the bedside and will plan for operative intervention of likely occluded graft later this evening. Lab work thus far is unremarkable, plan discussed with hospitalist for admission.  Case discussed with hospitalist for admission, patient has been taken to the Cath Lab with vascular surgery at this time.      ____________________________________________   FINAL CLINICAL IMPRESSION(S) / ED DIAGNOSES  Final diagnoses:  Ischemic leg     ED Discharge Orders    None       Note:  This document was prepared using Dragon voice recognition software and may include unintentional dictation errors.   Chesley Noon, MD 11/21/19 [phone]

## 2019-11-21 NOTE — H&P (Signed)
Chief complaint: Pain and numbness in the right foot starting approximately 7 to 8 hours ago  HPI: Patient is a 64 year old male that presents to the emergency room with sudden onset of pain coldness right lower extremity that occurred approximately 7 to 8 hours ago.  He informed his wife and because of the prior occurrence, they both realized that his right leg was suffering from acute ischemia.  He relates that the right leg feels cold and similar to his previous admission in February 2021 at which time he was found to have an occlusion of a previously placed femoral to peroneal graft.  His history dates back to 2017 when he underwent a right femoral endarterectomy and femoral to peroneal bypass.  In February during the procedure he was found to have an occluded graft and underwent lysis with mechanical thrombectomy and overnight lysis.  He required placement of a covered stent within the previous bypass due to residual thrombus.  He reportedly did well for the last few months and has been recently seen in the office.  His symptoms are all acute this afternoon. On further questioning patient relates relates that he has been off his Eliquis for 4 days due to an upcoming prostate biopsy.   PMH:  Collagen vascular disease Hyperlipidemia Hypertension Peripheral vascular disease status multiple interventions on the right leg  Allergies: hydrocodone  Family history : Diabetes mellitus.  Hypertension.  Social history : Patient consumes 7 cans of beer per week.  He denies any drug use.  He quit smoking in 2016  Medications: Amlodipine 5-20 mg capsule 1 capsule by mouth twice a day Plavix 75 mg 1 tablet once a day Lasix 20 mg 1 tablet once a day as needed Gabapentin 300 mg 1 capsule by mouth at bedtime Multivitamin Crestor 20 mg 1 tablet by mouth daily Zoloft 100 mg 1 tablet by mouth daily Flomax 0.4 mg capsule 1 capsule by mouth daily Aspirin 81 mg 1 tablet by mouth daily Eliquis which has been  on hold for 4 days  ROS: He denies any weight loss fever chills Denies any chest pain palpitation shortness of breath He admits to claudication in both lower extremities with intermittent swelling in the legs Denies any dizziness seizures or blackouts Denies any joint pain or back pain Denies easy bruising or bleeding Denies any diarrhea vomiting or abdominal pain. Admits to difficulty with urination No rashes reported  PE: Patient is awake alert oriented x3 Pupils equally reactive nonicteric sclera Neck is supple no bruits Heart is regular rate and rhythm no gallops or murmurs Chest is clear Abdomen is soft no tenderness rebound or guarding no pulsatile masses Femoral pulses are 2+ bilaterally.  Left foot pulses are nonpalpable.  Biphasic signal in the DP monophasic signal in the PT Right foot is white and cold.  He has difficulty moving the toes but is still able to flex and extend the toes.  No dopplerable signal  Assessment and plan  acute ischemia right lower extremity with a pulseless cold right foot I have reviewed with the patient and his wife the findings on my clinical examination as well as the nature of his symptoms.  He has occluded his right leg bypass and has a very tenuous outflow through a small peroneal artery that had previously been treated with balloon angioplasty due to stenosis.  His bypass graft required placement of a covered stent for residual thrombus.  My hope is to be able to go through the left femoral artery  and attempted mechanical thrombectomy as well as lysis.  He most likely will require overnight lysis.  I did discuss with him and his wife that any additional thrombosis represents additional risk for limb salvage.  He understands all the benefits and risks and wishes to proceed with attempt at salvage of his leg through an endovascular approach.  Answered all their questions

## 2019-11-22 ENCOUNTER — Encounter: Payer: Self-pay | Admitting: Family Medicine

## 2019-11-22 ENCOUNTER — Encounter
Admission: EM | Disposition: A | Discharge status: Home / Self Care | Payer: Self-pay | Source: Home / Self Care | Attending: Internal Medicine

## 2019-11-22 ENCOUNTER — Encounter: Payer: Self-pay | Admitting: Podiatry

## 2019-11-22 DIAGNOSIS — I998 Other disorder of circulatory system: Secondary | ICD-10-CM

## 2019-11-22 DIAGNOSIS — F1010 Alcohol abuse, uncomplicated: Secondary | ICD-10-CM | POA: Diagnosis present

## 2019-11-22 DIAGNOSIS — I708 Atherosclerosis of other arteries: Secondary | ICD-10-CM | POA: Diagnosis present

## 2019-11-22 DIAGNOSIS — Z87891 Personal history of nicotine dependence: Secondary | ICD-10-CM | POA: Diagnosis not present

## 2019-11-22 DIAGNOSIS — Z7982 Long term (current) use of aspirin: Secondary | ICD-10-CM | POA: Diagnosis not present

## 2019-11-22 DIAGNOSIS — I70221 Atherosclerosis of native arteries of extremities with rest pain, right leg: Secondary | ICD-10-CM | POA: Diagnosis present

## 2019-11-22 DIAGNOSIS — Z8249 Family history of ischemic heart disease and other diseases of the circulatory system: Secondary | ICD-10-CM | POA: Diagnosis not present

## 2019-11-22 DIAGNOSIS — Z20822 Contact with and (suspected) exposure to covid-19: Secondary | ICD-10-CM | POA: Diagnosis present

## 2019-11-22 DIAGNOSIS — I10 Essential (primary) hypertension: Secondary | ICD-10-CM | POA: Diagnosis present

## 2019-11-22 DIAGNOSIS — R310 Gross hematuria: Secondary | ICD-10-CM | POA: Diagnosis not present

## 2019-11-22 DIAGNOSIS — Z7902 Long term (current) use of antithrombotics/antiplatelets: Secondary | ICD-10-CM | POA: Diagnosis not present

## 2019-11-22 DIAGNOSIS — E785 Hyperlipidemia, unspecified: Secondary | ICD-10-CM | POA: Diagnosis present

## 2019-11-22 DIAGNOSIS — Z79899 Other long term (current) drug therapy: Secondary | ICD-10-CM | POA: Diagnosis not present

## 2019-11-22 DIAGNOSIS — N400 Enlarged prostate without lower urinary tract symptoms: Secondary | ICD-10-CM | POA: Diagnosis present

## 2019-11-22 DIAGNOSIS — I743 Embolism and thrombosis of arteries of the lower extremities: Secondary | ICD-10-CM | POA: Diagnosis present

## 2019-11-22 DIAGNOSIS — Z7901 Long term (current) use of anticoagulants: Secondary | ICD-10-CM | POA: Diagnosis not present

## 2019-11-22 DIAGNOSIS — Z885 Allergy status to narcotic agent status: Secondary | ICD-10-CM | POA: Diagnosis not present

## 2019-11-22 HISTORY — PX: PERIPHERAL VASCULAR THROMBECTOMY: CATH118306

## 2019-11-22 LAB — BASIC METABOLIC PANEL
Anion gap: 11 (ref 5–15)
BUN: 15 mg/dL (ref 8–23)
CO2: 21 mmol/L — ABNORMAL LOW (ref 22–32)
Calcium: 7.8 mg/dL — ABNORMAL LOW (ref 8.9–10.3)
Chloride: 104 mmol/L (ref 98–111)
Creatinine, Ser: 1.03 mg/dL (ref 0.61–1.24)
GFR calc Af Amer: >60 mL/min (ref >60)
GFR calc non Af Amer: >60 mL/min (ref >60)
Glucose, Bld: 150 mg/dL — ABNORMAL HIGH (ref 70–99)
Potassium: 3 mmol/L — ABNORMAL LOW (ref 3.5–5.1)
Sodium: 136 mmol/L (ref 135–145)

## 2019-11-22 LAB — COMPREHENSIVE METABOLIC PANEL
ALT: 27 U/L (ref 0–44)
AST: 49 U/L — ABNORMAL HIGH (ref 15–41)
Albumin: 4.1 g/dL (ref 3.5–5.0)
Alkaline Phosphatase: 63 U/L (ref 38–126)
Anion gap: 12 (ref 5–15)
BUN: 17 mg/dL (ref 8–23)
CO2: 23 mmol/L (ref 22–32)
Calcium: 8.3 mg/dL — ABNORMAL LOW (ref 8.9–10.3)
Chloride: 102 mmol/L (ref 98–111)
Creatinine, Ser: 1.1 mg/dL (ref 0.61–1.24)
GFR calc Af Amer: >60 mL/min (ref >60)
GFR calc non Af Amer: >60 mL/min (ref >60)
Glucose, Bld: 137 mg/dL — ABNORMAL HIGH (ref 70–99)
Potassium: 3.7 mmol/L (ref 3.5–5.1)
Sodium: 137 mmol/L (ref 135–145)
Total Bilirubin: 1.3 mg/dL — ABNORMAL HIGH (ref 0.3–1.2)
Total Protein: 6.4 g/dL — ABNORMAL LOW (ref 6.5–8.1)

## 2019-11-22 LAB — PROTIME-INR
INR: 1.3 — ABNORMAL HIGH (ref 0.8–1.2)
Prothrombin Time: 15.3 seconds — ABNORMAL HIGH (ref 11.4–15.2)

## 2019-11-22 LAB — TYPE AND SCREEN
ABO/RH(D): B POS
Antibody Screen: NEGATIVE

## 2019-11-22 LAB — HEPARIN LEVEL (UNFRACTIONATED): Heparin Unfractionated: <0.1 IU/mL — ABNORMAL LOW (ref 0.30–0.70)

## 2019-11-22 LAB — CBC
HCT: 35.7 % — ABNORMAL LOW (ref 39.0–52.0)
HCT: 36 % — ABNORMAL LOW (ref 39.0–52.0)
Hemoglobin: 12.3 g/dL — ABNORMAL LOW (ref 13.0–17.0)
Hemoglobin: 12.3 g/dL — ABNORMAL LOW (ref 13.0–17.0)
MCH: 31.3 pg (ref 26.0–34.0)
MCH: 31.2 pg (ref 26.0–34.0)
MCHC: 34.5 g/dL (ref 30.0–36.0)
MCHC: 34.2 g/dL (ref 30.0–36.0)
MCV: 90.8 fL (ref 80.0–100.0)
MCV: 91.4 fL (ref 80.0–100.0)
Platelets: 160 10*3/uL (ref 150–400)
Platelets: 141 10*3/uL — ABNORMAL LOW (ref 150–400)
RBC: 3.93 MIL/uL — ABNORMAL LOW (ref 4.22–5.81)
RBC: 3.94 MIL/uL — ABNORMAL LOW (ref 4.22–5.81)
RDW: 14.1 % (ref 11.5–15.5)
RDW: 14.1 % (ref 11.5–15.5)
WBC: 13.2 10*3/uL — ABNORMAL HIGH (ref 4.0–10.5)
WBC: 11.6 10*3/uL — ABNORMAL HIGH (ref 4.0–10.5)
nRBC: 0.2 % (ref 0.0–0.2)
nRBC: 0 % (ref 0.0–0.2)

## 2019-11-22 LAB — APTT
aPTT: >160 seconds (ref 24–36)
aPTT: 44 seconds — ABNORMAL HIGH (ref 24–36)
aPTT: 31 seconds (ref 24–36)

## 2019-11-22 LAB — MRSA PCR SCREENING: MRSA by PCR: NEGATIVE

## 2019-11-22 LAB — PHOSPHORUS: Phosphorus: 3 mg/dL (ref 2.5–4.6)

## 2019-11-22 LAB — GLUCOSE, CAPILLARY: Glucose-Capillary: 130 mg/dL — ABNORMAL HIGH (ref 70–99)

## 2019-11-22 LAB — MAGNESIUM: Magnesium: 1.8 mg/dL (ref 1.7–2.4)

## 2019-11-22 SURGERY — PERIPHERAL VASCULAR THROMBECTOMY
Anesthesia: Moderate Sedation | Laterality: Right

## 2019-11-22 MED ORDER — SODIUM CHLORIDE 0.9 % IV SOLN: 250.0000 mL | Freq: Once | INTRAVENOUS | Status: DC

## 2019-11-22 MED ORDER — ALTEPLASE (PULMONARY EMBOLISM) INFUSION: 100.0000 mg | Freq: Once | INTRAVENOUS | Status: DC

## 2019-11-22 MED ORDER — ADULT MULTIVITAMIN W/MINERALS CH
1.0000 | ORAL_TABLET | Freq: Every day | ORAL | Status: DC
  Administered 2019-11-22 – 2019-11-24 (×3): 1 via ORAL
  Filled 2019-11-22 (×3): qty 1

## 2019-11-22 MED ORDER — ONDANSETRON HCL 4 MG/2ML IJ SOLN
INTRAMUSCULAR | Status: AC
  Administered 2019-11-22: 4 mg via INTRAVENOUS
  Filled 2019-11-22: qty 2

## 2019-11-22 MED ORDER — SODIUM CHLORIDE FLUSH 0.9 % IV SOLN
INTRAVENOUS | Status: AC
  Filled 2019-11-22: qty 30

## 2019-11-22 MED ORDER — THIAMINE HCL 100 MG/ML IJ SOLN
Freq: Once | INTRAVENOUS | Status: AC
  Filled 2019-11-22: qty 1000

## 2019-11-22 MED ORDER — SERTRALINE HCL 25 MG PO TABS
25.0000 mg | ORAL_TABLET | Freq: Every day | ORAL | Status: DC
  Administered 2019-11-23 – 2019-11-24 (×2): 25 mg via ORAL
  Filled 2019-11-22 (×2): qty 1

## 2019-11-22 MED ORDER — ALTEPLASE 2 MG IJ SOLR
INTRAMUSCULAR | Status: AC
  Filled 2019-11-22: qty 4

## 2019-11-22 MED ORDER — THIAMINE HCL 100 MG PO TABS
100.0000 mg | ORAL_TABLET | Freq: Every day | ORAL | Status: DC
  Administered 2019-11-22 – 2019-11-24 (×3): 100 mg via ORAL
  Filled 2019-11-22 (×3): qty 1

## 2019-11-22 MED ORDER — SIMETHICONE 80 MG PO CHEW
160.0000 mg | CHEWABLE_TABLET | Freq: Four times a day (QID) | ORAL | Status: DC | PRN
  Administered 2019-11-22: 160 mg via ORAL
  Filled 2019-11-22 (×2): qty 2

## 2019-11-22 MED ORDER — LORAZEPAM 1 MG PO TABS
1.0000 mg | ORAL_TABLET | Freq: Four times a day (QID) | ORAL | Status: AC
  Administered 2019-11-22 (×2): 1 mg via ORAL
  Filled 2019-11-22 (×2): qty 1

## 2019-11-22 MED ORDER — SODIUM CHLORIDE 0.9 % IV SOLN
0.5000 mg/h | INTRAVENOUS | Status: DC
  Administered 2019-11-22: 0.5 mg/h
  Filled 2019-11-22 (×4): qty 10

## 2019-11-22 MED ORDER — MIDAZOLAM HCL 2 MG/2ML IJ SOLN
INTRAMUSCULAR | Status: DC | PRN
  Administered 2019-11-22: 1 mg via INTRAVENOUS

## 2019-11-22 MED ORDER — SODIUM CHLORIDE 0.9 % IV SOLN
1.0000 mg/h | INTRAVENOUS | Status: DC
  Administered 2019-11-22: 0.5 mg/h
  Administered 2019-11-22: 1 mg/h
  Filled 2019-11-22 (×8): qty 10

## 2019-11-22 MED ORDER — CHLORHEXIDINE GLUCONATE CLOTH 2 % EX PADS: 6.0000 | MEDICATED_PAD | Freq: Every day | CUTANEOUS | Status: DC

## 2019-11-22 MED ORDER — IODIXANOL 320 MG/ML IV SOLN
INTRAVENOUS | Status: DC | PRN
  Administered 2019-11-22: 40 mL via INTRA_ARTERIAL

## 2019-11-22 MED ORDER — GABAPENTIN 300 MG PO CAPS
300.0000 mg | ORAL_CAPSULE | Freq: Every day | ORAL | Status: DC
  Administered 2019-11-22 – 2019-11-23 (×2): 300 mg via ORAL
  Filled 2019-11-22 (×2): qty 1

## 2019-11-22 MED ORDER — HEPARIN (PORCINE) 25000 UT/250ML-% IV SOLN
INTRAVENOUS | Status: AC
  Administered 2019-11-22: 500 [IU]/h via INTRAVENOUS
  Filled 2019-11-22: qty 250

## 2019-11-22 MED ORDER — HEPARIN BOLUS VIA INFUSION
2000.0000 [IU] | Freq: Once | INTRAVENOUS | Status: AC
  Administered 2019-11-22: 2000 [IU] via INTRAVENOUS
  Filled 2019-11-22: qty 2000

## 2019-11-22 MED ORDER — FENTANYL CITRATE (PF) 100 MCG/2ML IJ SOLN
INTRAMUSCULAR | Status: DC | PRN
  Administered 2019-11-22: 50 ug via INTRAVENOUS

## 2019-11-22 MED ORDER — ONDANSETRON HCL 4 MG/2ML IJ SOLN: 4.0000 mg | Freq: Four times a day (QID) | INTRAMUSCULAR | Status: DC | PRN

## 2019-11-22 MED ORDER — MELATONIN 5 MG PO TABS
5.0000 mg | ORAL_TABLET | Freq: Every day | ORAL | Status: DC
  Administered 2019-11-22 – 2019-11-23 (×2): 5 mg via ORAL
  Filled 2019-11-22 (×2): qty 1

## 2019-11-22 MED ORDER — LORAZEPAM 2 MG/ML IJ SOLN: 1.0000 mg | INTRAMUSCULAR | Status: DC | PRN

## 2019-11-22 MED ORDER — LORAZEPAM 1 MG PO TABS: 1.0000 mg | ORAL_TABLET | ORAL | Status: DC | PRN

## 2019-11-22 MED ORDER — CEFAZOLIN SODIUM-DEXTROSE 2-4 GM/100ML-% IV SOLN
2.0000 g | Freq: Once | INTRAVENOUS | Status: AC
  Filled 2019-11-22: qty 100

## 2019-11-22 MED ORDER — SODIUM CHLORIDE 0.9 % IV SOLN
INTRAVENOUS | Status: DC | PRN
  Administered 2019-11-22: 250 mL via INTRAVENOUS

## 2019-11-22 MED ORDER — SENNOSIDES-DOCUSATE SODIUM 8.6-50 MG PO TABS: 1.0000 | ORAL_TABLET | Freq: Every evening | ORAL | Status: DC | PRN

## 2019-11-22 MED ORDER — FENTANYL CITRATE (PF) 100 MCG/2ML IJ SOLN
INTRAMUSCULAR | Status: AC
  Filled 2019-11-22: qty 2

## 2019-11-22 MED ORDER — BISACODYL 5 MG PO TBEC: 5.0000 mg | DELAYED_RELEASE_TABLET | Freq: Every day | ORAL | Status: DC | PRN

## 2019-11-22 MED ORDER — TAMSULOSIN HCL 0.4 MG PO CAPS
0.4000 mg | ORAL_CAPSULE | Freq: Every day | ORAL | Status: DC
  Administered 2019-11-23 – 2019-11-24 (×2): 0.4 mg via ORAL
  Filled 2019-11-22 (×2): qty 1

## 2019-11-22 MED ORDER — HEPARIN (PORCINE) 25000 UT/250ML-% IV SOLN: 500.0000 [IU]/h | INTRAVENOUS | Status: DC

## 2019-11-22 MED ORDER — FOLIC ACID 1 MG PO TABS
1.0000 mg | ORAL_TABLET | Freq: Every day | ORAL | Status: DC
  Administered 2019-11-22 – 2019-11-24 (×3): 1 mg via ORAL
  Filled 2019-11-22 (×3): qty 1

## 2019-11-22 MED ORDER — ONDANSETRON HCL 4 MG PO TABS: 4.0000 mg | ORAL_TABLET | Freq: Four times a day (QID) | ORAL | Status: DC | PRN

## 2019-11-22 MED ORDER — ACETAMINOPHEN 325 MG PO TABS: 650.0000 mg | ORAL_TABLET | Freq: Four times a day (QID) | ORAL | Status: DC | PRN

## 2019-11-22 MED ORDER — MIDAZOLAM HCL 5 MG/5ML IJ SOLN
INTRAMUSCULAR | Status: AC
  Filled 2019-11-22: qty 5

## 2019-11-22 MED ORDER — POTASSIUM CHLORIDE 10 MEQ/100ML IV SOLN
10.0000 meq | INTRAVENOUS | Status: AC
  Administered 2019-11-22 (×2): 10 meq via INTRAVENOUS
  Filled 2019-11-22 (×2): qty 100

## 2019-11-22 MED ORDER — PANTOPRAZOLE SODIUM 40 MG IV SOLR
40.0000 mg | Freq: Every day | INTRAVENOUS | Status: DC
  Administered 2019-11-22 – 2019-11-24 (×3): 40 mg via INTRAVENOUS
  Filled 2019-11-22 (×4): qty 40

## 2019-11-22 MED ORDER — ALUM & MAG HYDROXIDE-SIMETH 200-200-20 MG/5ML PO SUSP: 30.0000 mL | ORAL | Status: DC | PRN

## 2019-11-22 MED ORDER — MORPHINE SULFATE (PF) 2 MG/ML IV SOLN: 2.0000 mg | INTRAVENOUS | Status: DC | PRN

## 2019-11-22 MED ORDER — ROSUVASTATIN CALCIUM 10 MG PO TABS
20.0000 mg | ORAL_TABLET | Freq: Every day | ORAL | Status: DC
  Administered 2019-11-22 – 2019-11-24 (×3): 20 mg via ORAL
  Filled 2019-11-22 (×3): qty 2

## 2019-11-22 MED ORDER — SODIUM CHLORIDE 0.9 % IV SOLN
INTRAVENOUS | Status: AC | PRN
  Administered 2019-11-22: 500 mL via INTRAVENOUS

## 2019-11-22 MED ORDER — ACETAMINOPHEN 650 MG RE SUPP: 650.0000 mg | Freq: Four times a day (QID) | RECTAL | Status: DC | PRN

## 2019-11-22 MED ORDER — HEPARIN SODIUM (PORCINE) 1000 UNIT/ML IJ SOLN
INTRAMUSCULAR | Status: AC
  Filled 2019-11-22: qty 1

## 2019-11-22 MED ORDER — HEPARIN (PORCINE) 25000 UT/250ML-% IV SOLN
900.0000 [IU]/h | INTRAVENOUS | Status: DC
  Administered 2019-11-22: 300 [IU]/h via INTRAVENOUS
  Administered 2019-11-22: 500 [IU]/h via INTRAVENOUS
  Administered 2019-11-22: 700 [IU]/h via INTRAVENOUS
  Filled 2019-11-22: qty 250

## 2019-11-22 MED ORDER — HEPARIN SODIUM (PORCINE) 1000 UNIT/ML IJ SOLN
INTRAMUSCULAR | Status: DC | PRN
  Administered 2019-11-22: 2000 [IU] via INTRAVENOUS

## 2019-11-22 SURGICAL SUPPLY — 11 items
BALLN LUTONIX 018 5X40X130 (BALLOONS) ×2
BALLN MUSTANG 6X20X135 (BALLOONS) ×2
BALLOON LUTONIX 018 5X40X130 (BALLOONS) ×1 IMPLANT
BALLOON MUSTANG 6X20X135 (BALLOONS) ×1 IMPLANT
CATH VERT 5FR 125CM (CATHETERS) ×2 IMPLANT
DEVICE PRESTO INFLATION (MISCELLANEOUS) ×2 IMPLANT
DEVICE SAFEGUARD 24CM (GAUZE/BANDAGES/DRESSINGS) ×2 IMPLANT
DEVICE STARCLOSE SE CLOSURE (Vascular Products) ×2 IMPLANT
GLIDEWIRE ANGLED SS 035X260CM (WIRE) ×2 IMPLANT
PACK ANGIOGRAPHY (CUSTOM PROCEDURE TRAY) ×2 IMPLANT
WIRE SPARTACORE .014X300CM (WIRE) ×2 IMPLANT

## 2019-11-22 NOTE — OR Nursing (Signed)
Bedside report given to Tahirrah RN, instructed to switch heparin infusion from lysis catheter to sheath when TPA drip arrives from pharmacy

## 2019-11-22 NOTE — Consult Note (Signed)
PULMONARY / CRITICAL CARE MEDICINE  Name: Travis Salas MRN: [id number] DOB: February 12, 1956    LOS: 0  Referring Provider: Griffith Citron, MD  Reason for Referral: Ischemia s/p thrombectomy, balloon angioplasty and TPA lysis of occluded graft Brief patient description: 64 year old male with a known history of PAD and previous limb ischemia with stent placement now presenting currently ischemia due to stent occlusion.  He is being admitted to the ICU s/p TPA lysis of the clot, balloon angioplasty of the occluded graft.    HPI: This is a 64 year old Caucasian male with a medical history as indicated below, previous multiple lower extremity procedures for his limb ischemia who presented to the ED with acute right lower extremity pain and coolness.  He was on Eliquis and apparently had been off it for 4 days due to upcoming prostate biopsy for BPH.  He was seen by vascular surgery and and taken to the OR and underwent TPA lysis and balloon angioplasty of the occluded graft.  He reports improvement in symptoms post procedure.  He is due to return to the OR this morning.  Of note, patient has a history of EtOH abuse and drinks approximately 10 beers per night. He has some hematuria from Foley placement and being on TPA.  The plan is to monitor the hematuria and initiate a urology consult if needed. He denies any chest pain, palpitations, dizziness and headache.  He reports improvement in right leg pain.   SIGNIFICANT EVENTS: 11/21/2019: Admitted with acute right limb ischemia   Past Medical History:  Diagnosis Date  . Collagen vascular disease (HCC)   . Hyperlipidemia   . Hypertension   . Peripheral vascular disease [facility])    Past Surgical History:  Procedure Laterality Date  . ENDARTERECTOMY FEMORAL Right 12/23/2015   Procedure: COMMON, FEMORAL, SUPERFICIAL, AND PROFUNDA ENDARTERECTOMY;  Surgeon: Annice Needy, MD;  Location: [facility] ORS;  Service: Vascular;  Laterality: Right;  . FEMORAL-TIBIAL  BYPASS GRAFT Right 12/23/2015   Procedure: BYPASS GRAFT FEMORAL-TIBIAL ARTERY, COMMON FEMORAL AND PROFUNDIS FEMORAL WITH PATCH ANGIOPLASTY, POPLITEAL ANTERIOR ARTERY BYPASS, ILIOFEMORAL ANGIOGRAM VIABHAM STENT 8 MM DIAMETER X 5 CM LENGTH;  Surgeon: Annice Needy, MD;  Location: [facility] ORS;  Service: Vascular;  Laterality: Right;  . LOWER EXTREMITY ANGIOGRAPHY Right 06/19/2019   Procedure: LOWER EXTREMITY ANGIOGRAPHY;  Surgeon: Annice Needy, MD;  Location: [facility] INVASIVE CV LAB;  Service: Cardiovascular;  Laterality: Right;  . LOWER EXTREMITY ANGIOGRAPHY Right 06/20/2019   Procedure: Lower Extremity Angiography;  Surgeon: Annice Needy, MD;  Location: [facility] INVASIVE CV LAB;  Service: Cardiovascular;  Laterality: Right;  . PERIPHERAL VASCULAR CATHETERIZATION Right 11/08/2015   Procedure: Lower Extremity Angiography;  Surgeon: Annice Needy, MD;  Location: [facility] INVASIVE CV LAB;  Service: Cardiovascular;  Laterality: Right;  . PERIPHERAL VASCULAR CATHETERIZATION Right 12/13/2015   Procedure: Lower Extremity Angiography;  Surgeon: Annice Needy, MD;  Location: [facility] INVASIVE CV LAB;  Service: Cardiovascular;  Laterality: Right;   No current facility-administered medications on file prior to encounter.   Current Outpatient Medications on File Prior to Encounter  Medication Sig  . amLODipine-benazepril (LOTREL) 5-20 MG capsule Take 1 capsule by mouth 2 (two) times daily.   [name] apixaban (ELIQUIS) 5 MG TABS tablet Take 1 tablet (5 mg total) by mouth 2 (two) times daily.  [name] aspirin EC 81 MG EC tablet Take 1 tablet (81 mg total) by mouth daily. (Patient not taking: Reported on 10/21/2019)  . ciclopirox (PENLAC) 8 % solution APPLY  OVER NAIL AND SURROUNDING SKIN DAILY AT BEDTIME OVER PREVIOUS COAT. AFTER 7 DAYS, MAY REMOVE WITH ALCOHOL AND CONTINUE CYCLE  . furosemide (LASIX) 20 MG tablet TK 1 T PO QD PRN.  . gabapentin (NEURONTIN) 300 MG capsule TAKE 1 CAPSULE BY MOUTH AT BEDTIME  . Multiple Vitamin (MULTIVITAMIN WITH MINERALS)  TABS tablet Take 1 tablet by mouth every evening.  . Omega-3 Fatty Acids (FISH OIL) 1200 MG CAPS Take 2,400 mg by mouth 2 (two) times daily.  [name] oxyCODONE (OXY IR/ROXICODONE) 5 MG immediate release tablet Take 1 tablet (5 mg total) by mouth every 4 (four) hours as needed for moderate pain. (Patient not taking: Reported on 10/21/2019)  . rosuvastatin (CRESTOR) 20 MG tablet Take 20 mg by mouth daily.  . sertraline (ZOLOFT) 100 MG tablet TK 1 T PO QD  . Specialty Vitamins Products (ONE-A-DAY PROSTATE PO) Take 1 tablet by mouth daily. "Super Beta Prostate"  . tamsulosin (FLOMAX) 0.4 MG CAPS capsule Take 0.4 mg by mouth daily.  [name] Zn-Pyg Afri-Nettle-Saw Palmet (SAW PALMETTO COMPLEX PO) Take 2 tablets by mouth daily.    Allergies Allergies  Allergen Reactions  . Hydrocodone Swelling    Family History Family History  Problem Relation Age of Onset  . Diabetes Mother   . Hypertension Brother    Social History  reports that he quit smoking about 4 years ago. His smoking use included cigarettes. He has a 69.00 pack-year smoking history. He has never used smokeless tobacco. He reports current alcohol use of about 7.0 standard drinks of alcohol per week. He reports current drug use. Drug: Marijuana.  Review Of Systems:  Constitutional: Negative for fever and chills.  HENT: Negative for congestion and rhinorrhea.  Eyes: Negative for redness and visual disturbance.  Respiratory: Negative for shortness of breath and wheezing.  Cardiovascular: Negative for chest pain and palpitations.  Gastrointestinal: Negative  for nausea , vomiting and positive for epigastric pain  Genitourinary: Positive for BPH and hematuria Endocrine: Denies polyuria, polyphagia and heat intolerance Musculoskeletal: Negative for myalgias and arthralgias.  Extremities: Reports cool and painful right lower extremity Skin: Positive for pallor right lower extremity Neurological: Negative for dizziness and headaches   VITAL  SIGNS: BP (!) 166/87   Pulse 81   Temp 98.1 F (36.7 C) (Oral)   Resp 13   Ht 5\' 8"  (1.727 m)   Wt 75 kg   SpO2 98%   BMI 25.14 kg/m   HEMODYNAMICS:    VENTILATOR SETTINGS:    INTAKE / OUTPUT: No intake/output data recorded.  PHYSICAL EXAMINATION: General: Awake, in moderate distress HEENT: PERRLA, trachea midline, no JVD Neuro: Alert and oriented x4, no focal deficit Cardiovascular: Apical pulse regular, S1-S2, no murmur regurg or gallop, no edema, +2 pulses in bilateral upper extremities, +1 popliteal and pedal pulses in bilateral lower extremities Lungs: Clear to auscultation bilaterally Abdomen: Nondistended, normal bowel sounds in all 4 quadrants, palpation reveals epigastric tenderness Genitourinary: Foley in place with bloody urine that is clearing up Musculoskeletal: Positive range of motion, no joint deformities Skin: Bilateral lower extremities cool to touch with mild cyanosis in the right lower extremity Extremities: Mild cyanosis of right lower extremity, cool to touch, +1 palpable pulses  LABS:  BMET Recent Labs  Lab 11/21/19 2101  NA 135  K 3.9  CL 97*  CO2 25  BUN 18  CREATININE 1.13  GLUCOSE 150*    Electrolytes Recent Labs  Lab 11/21/19 2101  CALCIUM 9.7    CBC Recent  Labs  Lab 11/21/19 2101  WBC 12.1*  HGB 13.8  HCT 40.1  PLT 180    Coag's Recent Labs  Lab 11/21/19 2101  INR 1.0    Sepsis Markers No results for input(s): LATICACIDVEN, PROCALCITON, O2SATVEN in the last 168 hours.  ABG No results for input(s): PHART, PCO2ART, PO2ART in the last 168 hours.  Liver Enzymes No results for input(s): AST, ALT, ALKPHOS, BILITOT, ALBUMIN in the last 168 hours.  Cardiac Enzymes No results for input(s): TROPONINI, PROBNP in the last 168 hours.  Glucose Recent Labs  Lab 11/22/19 0149  GLUCAP 130*    Imaging No results found.   STUDIES:  None  CULTURES: None  ANTIBIOTICS: None  LINES/TUBES: Peripheral  IVs Right femoral triple-lumen catheter  DISCUSSION: 64 year old male presenting with acute right lower limb ischemia now s/p TPA lysis with balloon angioplasty.  ASSESSMENT Acute limb ischemia s/p TPA lysis and balloon angioplasty  Gross hematuria secondary to BPH, trauma from Foley and TPA Severe peripheral vascular disease status post multiple vascular interventions EtOH abuse with high risk for acute withdrawal and DTs Epigastric pain likely alcoholic esophagitis Collagen vascular disease Hyperlipidemia Hypertension  PLAN Hemodynamics per ICU protocol Heparin infusion and TPA per vascular Patient is bleeding from left groin/triple-lumen catheter side and from his Foley; bleeding is mild; hemoglobin and hematocrit stable; we will continue to monitor.  No need to stop TPN heparin at this time as the risk of bleeding are minimal compared to the risk of thrombosis To I's and O's CIWA protocol with p.o. and IV lorazepam Banana bag x1 and relay with oral thiamine and folic acid PPI, Maalox and Gas-X as needed    Best Practice: Code Status: Full code Diet: N.p.o. GI prophylaxis: PPI VTE prophylaxis: Already on full-strength anticoagulation  FAMILY  - Updates: Patient updated on current treatment plan.  No family at bedside.  Will update when available.  Plan of care discussed with vascular surgeon Dr.Arbid and Dr Warrick Parisian at [facility]. [facility] ANP-BC Pulmonary and Critical Care Medicine [facility] Pager [phone] or [phone]  NB: This document was prepared using Dragon voice recognition software and may include unintentional dictation errors.    11/22/2019, 2:26 AM

## 2019-11-22 NOTE — Progress Notes (Signed)
ANTICOAGULATION CONSULT NOTE - Initial Consult  Pharmacy Consult for Heparin Indication: limb ischemia w/ TPA  Allergies  Allergen Reactions  . Hydrocodone Swelling   Patient Measurements: Height: 5\' 8"  (172.7 cm) Weight: 75 kg (165 lb 5.5 oz) IBW/kg (Calculated) : 68.4  Vital Signs: Temp: 98.1 F (36.7 C) (07/10 0150) Temp Source: Oral (07/10 0150) BP: 166/87 (07/10 0150) Pulse Rate: 81 (07/10 0150)  Labs: Recent Labs    11/21/19 2101  HGB 13.8  HCT 40.1  PLT 180  LABPROT 12.7  INR 1.0  CREATININE 1.13    Estimated Creatinine Clearance: 64.7 mL/min (by C-G formula based on SCr of 1.13 mg/dL).   Medical History: Past Medical History:  Diagnosis Date  . Collagen vascular disease (HCC)   . Hyperlipidemia   . Hypertension   . Peripheral vascular disease (HCC)     Medications:  Medications Prior to Admission  Medication Sig Dispense Refill Last Dose  . amLODipine-benazepril (LOTREL) 5-20 MG capsule Take 1 capsule by mouth 2 (two) times daily.   5   . apixaban (ELIQUIS) 5 MG TABS tablet Take 1 tablet (5 mg total) by mouth 2 (two) times daily. 60 tablet 5   . aspirin EC 81 MG EC tablet Take 1 tablet (81 mg total) by mouth daily. (Patient not taking: Reported on 10/21/2019)     . ciclopirox (PENLAC) 8 % solution APPLY OVER NAIL AND SURROUNDING SKIN DAILY AT BEDTIME OVER PREVIOUS COAT. AFTER 7 DAYS, MAY REMOVE WITH ALCOHOL AND CONTINUE CYCLE 6.6 mL 0   . furosemide (LASIX) 20 MG tablet TK 1 T PO QD PRN.  2   . gabapentin (NEURONTIN) 300 MG capsule TAKE 1 CAPSULE BY MOUTH AT BEDTIME 60 capsule 0   . Multiple Vitamin (MULTIVITAMIN WITH MINERALS) TABS tablet Take 1 tablet by mouth every evening.     . Omega-3 Fatty Acids (FISH OIL) 1200 MG CAPS Take 2,400 mg by mouth 2 (two) times daily.     12/21/2019 oxyCODONE (OXY IR/ROXICODONE) 5 MG immediate release tablet Take 1 tablet (5 mg total) by mouth every 4 (four) hours as needed for moderate pain. (Patient not taking: Reported on  10/21/2019) 30 tablet 0   . rosuvastatin (CRESTOR) 20 MG tablet Take 20 mg by mouth daily.     . sertraline (ZOLOFT) 100 MG tablet TK 1 T PO QD  3   . Specialty Vitamins Products (ONE-A-DAY PROSTATE PO) Take 1 tablet by mouth daily. "Super Beta Prostate"     . tamsulosin (FLOMAX) 0.4 MG CAPS capsule Take 0.4 mg by mouth daily.     12/21/2019 Zn-Pyg Afri-Nettle-Saw Palmet (SAW PALMETTO COMPLEX PO) Take 2 tablets by mouth daily.       Assessment: Heparin initiate in cath lab at 500 units/hr.  Pt on TPA infusion.    Goal of Therapy:  aPTT 66-102 seconds Monitor platelets by anticoagulation protocol: Yes   Plan:  Heparin started in cath lab at 500 units/hr Will check aPTT at 0800 Monitor for s/sx bleeding complications  [name] A 11/22/2019,1:58 AM

## 2019-11-22 NOTE — Progress Notes (Signed)
Patient APTT > 160. Spoke with Lorin Picket the Pharmacist. Verbal order given to hold the Heparin x 2 hrs then resume @ 300 units/hr.

## 2019-11-22 NOTE — Progress Notes (Signed)
ANTICOAGULATION CONSULT NOTE -   Pharmacy Consult for Heparin Indication: limb ischemia w/ TPA  Allergies  Allergen Reactions  . Hydrocodone Swelling   Patient Measurements: Height: 5\' 8"  (172.7 cm) Weight: 75 kg (165 lb 5.5 oz) IBW/kg (Calculated) : 68.4  Vital Signs: Temp: 98.1 F (36.7 C) (07/10 0150) Temp Source: Oral (07/10 0150) BP: 166/87 (07/10 0150) Pulse Rate: 81 (07/10 0150)  Labs: Recent Labs    11/21/19 2101 11/21/19 2101 11/22/19 0156 11/22/19 0320  HGB 13.8   < > 12.3* 12.3*  HCT 40.1  --  35.7* 36.0*  PLT 180  --  160 141*  APTT  --   --   --  >160*  LABPROT 12.7  --   --  15.3*  INR 1.0  --   --  1.3*  CREATININE 1.13  --  1.10 1.03   < > = values in this interval not displayed.    Estimated Creatinine Clearance: 71 mL/min (by C-G formula based on SCr of 1.03 mg/dL).   Medical History: Past Medical History:  Diagnosis Date  . Collagen vascular disease (HCC)   . Hyperlipidemia   . Hypertension   . Peripheral vascular disease (HCC)     Medications:  Medications Prior to Admission  Medication Sig Dispense Refill Last Dose  . amLODipine-benazepril (LOTREL) 5-20 MG capsule Take 1 capsule by mouth 2 (two) times daily.   5   . apixaban (ELIQUIS) 5 MG TABS tablet Take 1 tablet (5 mg total) by mouth 2 (two) times daily. 60 tablet 5   . aspirin EC 81 MG EC tablet Take 1 tablet (81 mg total) by mouth daily. (Patient not taking: Reported on 10/21/2019)     . ciclopirox (PENLAC) 8 % solution APPLY OVER NAIL AND SURROUNDING SKIN DAILY AT BEDTIME OVER PREVIOUS COAT. AFTER 7 DAYS, MAY REMOVE WITH ALCOHOL AND CONTINUE CYCLE 6.6 mL 0   . furosemide (LASIX) 20 MG tablet TK 1 T PO QD PRN.  2   . gabapentin (NEURONTIN) 300 MG capsule TAKE 1 CAPSULE BY MOUTH AT BEDTIME 60 capsule 0   . Multiple Vitamin (MULTIVITAMIN WITH MINERALS) TABS tablet Take 1 tablet by mouth every evening.     . Omega-3 Fatty Acids (FISH OIL) 1200 MG CAPS Take 2,400 mg by mouth 2 (two)  times daily.     12/21/2019 oxyCODONE (OXY IR/ROXICODONE) 5 MG immediate release tablet Take 1 tablet (5 mg total) by mouth every 4 (four) hours as needed for moderate pain. (Patient not taking: Reported on 10/21/2019) 30 tablet 0   . rosuvastatin (CRESTOR) 20 MG tablet Take 20 mg by mouth daily.     . sertraline (ZOLOFT) 100 MG tablet TK 1 T PO QD  3   . Specialty Vitamins Products (ONE-A-DAY PROSTATE PO) Take 1 tablet by mouth daily. "Super Beta Prostate"     . tamsulosin (FLOMAX) 0.4 MG CAPS capsule Take 0.4 mg by mouth daily.     12/21/2019 Zn-Pyg Afri-Nettle-Saw Palmet (SAW PALMETTO COMPLEX PO) Take 2 tablets by mouth daily.       Assessment: Heparin initiate in cath lab at 500 units/hr.  Pt on TPA infusion.    Goal of Therapy:  aPTT 66-102 seconds Monitor platelets by anticoagulation protocol: Yes   Plan:  Heparin started in cath lab at 500 units/hr APTT @ 0323 > 160.  Will hold heparin x 2 hours then resume at 300 units/hr and recheck aPTT 6 hours after restarted.  Monitor for s/sx bleeding  complications  Valrie Hart A 11/22/2019,4:40 AM

## 2019-11-22 NOTE — Op Note (Signed)
Date of surgery: 11/22/2019   Pre-operative Diagnosis: PAD with rest pain right lower extremity, status post overnight thrombolytic therapy  Post-operative diagnosis: Same  Procedure(s) Performed: 1.  Right lower extremity angiogram 2. Catheter placement into right femoral to distal bypass and right profunda femoris artery from left femoral approach 3. Percutaneous transluminal angioplasty of the right profunda femoris artery using a 5x40  mm DCB followed by 6x20 plain balloon  4. Percutaneous transluminal angioplasty of right External iliac artery with 6x44m balloon for proximal edge stenosis of EIA stent 5. StarClose closure device left femoral artery  EBL: 50 cc  Contrast: 40 cc  Fluoro Time: 6 minutes  Moderate Conscious Sedation Time: approximately 35 minutes using 143mof Versed and 25 mcg of Fentanyl  Indications: Patient is a [age].o.male with an ischemic right leg.  He has undergone overnight thrombolytic therapy and is brought back for second look angiography and intervention.  Due to the limb threatening nature of the situation, angiogram was performed for attempted limb salvage. The patient is aware that if the procedure fails, amputation would be expected.  The patient also understands that even with successful revascularization, amputation may still be required due to the severity of the situation.  Risks and benefits are discussed and informed consent is obtained.   Procedure: The patient was identified and appropriate procedural time out was performed. The patient was then placed supine on the table and prepped and draped in the usual sterile fashion.Moderate conscious sedation was administered during a face to face encounter with the patient throughout the procedure with my supervision of the RN administering medicines and monitoring the patient's vital signs, pulse oximetry, telemetry and mental  status throughout from the start of the procedure until the patient was taken to the recovery room.  The existing thrombolytic catheter was removed and replaced with a V 14 wire parked down into the PT.  Selective right lower extremity angiogram was then performed. This demonstrated there to be a patent PT, ATA and peroneal arteries with no significant stenosis. there was partial filling of the arch of the foot. The graft was then evaluated.  There was good flow through the graft. With no residual thrombus within the body of the graft but with a small amount of laminated thrombus at the distal anastomosis.but with organized laminated clot. The proximal bypass anastomosis was widely patent.  There remained a very high-grade stenosis of the profunda femoris artery in the 90% range.   All 3 tibial vessels had flow distally.    Attention was then turned to the   the profunda femoris artery.  The wire and catheter were removed from the bypass graft and the tibial vessels.  I then selectively cannulated the profunda femoris artery with a Kumpe catheter and the V 14 wire without difficulty and after selective imaging of the profunda femoris arteries performed I elected to treat this area with a 5 mm diameter by 4 cm length Lutonix drug-coated angioplasty balloon in the proximal profunda femoris artery inflated to 12 atm for 1 minute.  Completion imaging showed some residual stenosis in the 60% range and a 6x20 mm plain balloon was used . Repeat angio showed markedly improved profunda origin with < 20% stenosis and brisk flow down the leg. The right EIA stenosis was then reevaluated and appeard to be 70% in oblique view. This was treated with balloon angioplasty with <105 residual stenosis. Completion angio demonstrated brisk flow into the foot.  I elected to terminate the procedure.  The sheath was removed and StarClose closure device was deployed in the left femoral artery with excellent hemostatic result. The patient  was taken to the recovery room in stable condition having tolerated the procedure well.  Findings:   Right lower Extremity: This demonstrated there to be a moderate amount of thrombus in the midportion of the bypass graft but a marked improvement after overnight thrombolytic therapy.  There was good flow through the graft.  The proximal bypass anastomosis was widely patent.  There remained a very high-grade stenosis of the profunda femoris artery in the 90% range.  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  This was moderate in size.  All 3 tibial vessels had flow distally.  The anterior tibial and posterior tibial arteries did not have focal stenosis and were continuous distally.   Disposition: Patient was taken to the recovery room in stable condition having tolerated the procedure well.  Complications: None

## 2019-11-22 NOTE — OR Nursing (Signed)
Foley readjusted

## 2019-11-22 NOTE — Progress Notes (Signed)
ANTICOAGULATION CONSULT NOTE  Pharmacy Consult for Heparin Indication: limb ischemia  Patient Measurements: Height: 5\' 8"  (172.7 cm) Weight: 75 kg (165 lb 5.5 oz) IBW/kg (Calculated) : 68.4  Vital Signs: Temp: 98.3 F (36.8 C) (07/10 0800) Temp Source: Oral (07/10 0800) BP: 103/64 (07/10 1240) Pulse Rate: 74 (07/10 1240)  Labs: Recent Labs    11/21/19 2101 11/21/19 2101 11/22/19 0156 11/22/19 0320  HGB 13.8   < > 12.3* 12.3*  HCT 40.1  --  35.7* 36.0*  PLT 180  --  160 141*  APTT  --   --   --  >160*  LABPROT 12.7  --   --  15.3*  INR 1.0  --   --  1.3*  CREATININE 1.13  --  1.10 1.03   < > = values in this interval not displayed.    Estimated Creatinine Clearance: 71 mL/min (by C-G formula based on SCr of 1.03 mg/dL).   Medical History: Past Medical History:  Diagnosis Date   Collagen vascular disease (HCC)    Hyperlipidemia    Hypertension    Peripheral vascular disease (HCC)     Medications:  Medications Prior to Admission  Medication Sig Dispense Refill Last Dose   amLODipine-benazepril (LOTREL) 5-20 MG capsule Take 1 capsule by mouth 2 (two) times daily.   5    apixaban (ELIQUIS) 5 MG TABS tablet Take 1 tablet (5 mg total) by mouth 2 (two) times daily. 60 tablet 5    aspirin EC 81 MG EC tablet Take 1 tablet (81 mg total) by mouth daily. (Patient not taking: Reported on 10/21/2019)   Not Taking at Unknown time   ciclopirox (PENLAC) 8 % solution APPLY OVER NAIL AND SURROUNDING SKIN DAILY AT BEDTIME OVER PREVIOUS COAT. AFTER 7 DAYS, MAY REMOVE WITH ALCOHOL AND CONTINUE CYCLE 6.6 mL 0    furosemide (LASIX) 20 MG tablet TK 1 T PO QD PRN.  2    gabapentin (NEURONTIN) 300 MG capsule TAKE 1 CAPSULE BY MOUTH AT BEDTIME 60 capsule 0    Multiple Vitamin (MULTIVITAMIN WITH MINERALS) TABS tablet Take 1 tablet by mouth every evening.      Omega-3 Fatty Acids (FISH OIL) 1200 MG CAPS Take 2,400 mg by mouth 2 (two) times daily.      oxyCODONE (OXY  IR/ROXICODONE) 5 MG immediate release tablet Take 1 tablet (5 mg total) by mouth every 4 (four) hours as needed for moderate pain. (Patient not taking: Reported on 10/21/2019) 30 tablet 0 Not Taking at Unknown time   rosuvastatin (CRESTOR) 20 MG tablet Take 20 mg by mouth daily.      sertraline (ZOLOFT) 100 MG tablet TK 1 T PO QD  3    Specialty Vitamins Products (ONE-A-DAY PROSTATE PO) Take 1 tablet by mouth daily. "Super Beta Prostate"      tamsulosin (FLOMAX) 0.4 MG CAPS capsule Take 0.4 mg by mouth daily.      Zn-Pyg Afri-Nettle-Saw Palmet (SAW PALMETTO COMPLEX PO) Take 2 tablets by mouth daily.       Assessment: 64 y.o. male with a known history of collagen vascular disease, hypertension, hyperlipidemia, BPH,  previous limb ischemia on apixaban presents to the emergency department for evaluation of sudden onset of right lower extremity pain. Heparin initiated in cath lab at 500 units/hr. Previous supratherapeutic aPTT though to be an error based on TPA co-infusion (re-enforced by heparin infusion data on previous admissions requiring 750 units/hr). He is s/p RLE angiogram and angioplasty today. Additionally this  afternoon's aPTT and anti-Xa levels were drawn after heparin was stopped resulting in conflicting and incomplete information. H&H, platelets are noted to be trending down.  Goal of Therapy:  aPTT 66-102 seconds Heparin level 0.3 - 0.7 IU.mL Monitor platelets by anticoagulation protocol: Yes   Plan:   Heparin restarted in cath lab at 500 units/hr at 1332  check aPTT 6 hours after restarted  aPTT will be used to guide therapy (given recent apixaban administration PTA) until anti-Xa and aPTT correlate  CBC in am  Monitor for s/sx bleeding complications  Lowella Bandy 11/22/2019,1:08 PM

## 2019-11-22 NOTE — H&P (Signed)
History and Physical   [facility] [place] [facility] Admission History and Physical [employer], D.O.    Patient Name: Travis Salas MR#: [id number] Date of Birth: 01-27-1956 Date of Admission: 11/21/2019  Referring MD/NP/PA: Dr. Consuello Closs Primary Care Physician: Jaclyn Shaggy, MD  Chief Complaint:  Chief Complaint  Patient presents with  . Claudication    HPI: Travis Salas is a 64 y.o. male with a known history of collagen vascular disease, hypertension, hyperlipidemia, BPH,  previous limb ischemia presents to the emergency department for evaluation of sudden onset of right lower extremity pain, change in color to pale, and sensation of coldness that began 8 hours prior to arrival in the emergency department.  Of note patient has been off Eliquis for the last 4 days pending an upcoming prostate biopsy. Was instructed to hold for one week prior to procedure.  He spoke with his vascular surgeon who referred him to the emergency department for evaluation.  Episode was similar to previous episode of acute limb ischemia which occurred in February 2021 as a result of an occlusion of a femoral to peroneal graft.  (Copied from vascular surgery note: His history dates back to 2017 when he underwent a right femoral endarterectomy and femoral to peroneal bypass.  In February during the procedure he was found to have an occluded graft and underwent lysis with mechanical thrombectomy and overnight lysis.  He required placement of a covered stent within the previous bypass due to residual thrombus.  He reportedly did well for the last few months and has been recently seen in the office.)   Patient denies fevers/chills, weakness, dizziness, chest pain, shortness of breath, N/V/C/D, abdominal pain, dysuria/frequency, changes in mental status.    EMS/ED Course: Patient received taken immediately to Cath Lab for TPA lysis of an occluded right leg bypass graft with balloon angioplasty by Dr.  Consuello Closs.  He has been started on a heparin drip and will be returning to the cath lab tomorrow.  Medical admission has been requested with vascular surgery consulting.  Review of Systems:  CONSTITUTIONAL: No fever/chills, fatigue, weakness, weight gain/loss, headache. EYES: No blurry or double vision. ENT: No tinnitus, postnasal drip, redness or soreness of the oropharynx. RESPIRATORY: No cough, dyspnea, wheeze.  No hemoptysis.  CARDIOVASCULAR: No chest pain, palpitations, syncope, orthopnea. No lower extremity edema.  GASTROINTESTINAL: No nausea, vomiting, abdominal pain, diarrhea, constipation.  No hematemesis, melena or hematochezia. GENITOURINARY: No dysuria, frequency, hematuria. ENDOCRINE: No polyuria or nocturia. No heat or cold intolerance. HEMATOLOGY: No anemia, bruising, bleeding. INTEGUMENTARY: No rashes, ulcers, lesions. MUSCULOSKELETAL: Positive right lower extremity pain and coldness.  No arthritis, gout, NEUROLOGIC: No numbness, tingling, ataxia, seizure-type activity, weakness. PSYCHIATRIC: No anxiety, depression, insomnia.   Past Medical History:  Diagnosis Date  . Collagen vascular disease (HCC)   . Hyperlipidemia   . Hypertension   . Peripheral vascular disease [facility])     Past Surgical History:  Procedure Laterality Date  . ENDARTERECTOMY FEMORAL Right 12/23/2015   Procedure: COMMON, FEMORAL, SUPERFICIAL, AND PROFUNDA ENDARTERECTOMY;  Surgeon: Annice Needy, MD;  Location: [facility] ORS;  Service: Vascular;  Laterality: Right;  . FEMORAL-TIBIAL BYPASS GRAFT Right 12/23/2015   Procedure: BYPASS GRAFT FEMORAL-TIBIAL ARTERY, COMMON FEMORAL AND PROFUNDIS FEMORAL WITH PATCH ANGIOPLASTY, POPLITEAL ANTERIOR ARTERY BYPASS, ILIOFEMORAL ANGIOGRAM VIABHAM STENT 8 MM DIAMETER X 5 CM LENGTH;  Surgeon: Annice Needy, MD;  Location: [facility] ORS;  Service: Vascular;  Laterality: Right;  . LOWER EXTREMITY ANGIOGRAPHY Right 06/19/2019  Procedure: LOWER EXTREMITY ANGIOGRAPHY;  Surgeon: Annice Needyew, Jason S,  MD;  Location: [facility] INVASIVE CV LAB;  Service: Cardiovascular;  Laterality: Right;  . LOWER EXTREMITY ANGIOGRAPHY Right 06/20/2019   Procedure: Lower Extremity Angiography;  Surgeon: Annice Needyew, Jason S, MD;  Location: [facility] INVASIVE CV LAB;  Service: Cardiovascular;  Laterality: Right;  . PERIPHERAL VASCULAR CATHETERIZATION Right 11/08/2015   Procedure: Lower Extremity Angiography;  Surgeon: Annice NeedyJason S Dew, MD;  Location: [facility] INVASIVE CV LAB;  Service: Cardiovascular;  Laterality: Right;  . PERIPHERAL VASCULAR CATHETERIZATION Right 12/13/2015   Procedure: Lower Extremity Angiography;  Surgeon: Annice NeedyJason S Dew, MD;  Location: [facility] INVASIVE CV LAB;  Service: Cardiovascular;  Laterality: Right;     reports that he quit smoking about 4 years ago. His smoking use included cigarettes. He has a 69.00 pack-year smoking history. He has never used smokeless tobacco. He reports current alcohol use of about 7.0 standard drinks of alcohol per week. He reports current drug use. Drug: Marijuana.   Drinks 7-8 beers daily.   Allergies  Allergen Reactions  . Hydrocodone Swelling    Family History  Problem Relation Age of Onset  . Diabetes Mother   . Hypertension Brother     Prior to Admission medications   Medication Sig Start Date End Date Taking? Authorizing Provider  amLODipine-benazepril (LOTREL) 5-20 MG capsule Take 1 capsule by mouth 2 (two) times daily.  09/16/15   [provider]  apixaban (ELIQUIS) 5 MG TABS tablet Take 1 tablet (5 mg total) by mouth 2 (two) times daily. 06/21/19   Nada LibmanBrabham, Vance W, MD  aspirin EC 81 MG EC tablet Take 1 tablet (81 mg total) by mouth daily. Patient not taking: Reported on 10/21/2019 12/14/15   Stegmayer, Cala BradfordKimberly A, PA-C  ciclopirox (PENLAC) 8 % solution APPLY OVER NAIL AND SURROUNDING SKIN DAILY AT BEDTIME OVER PREVIOUS COAT. AFTER 7 DAYS, MAY REMOVE WITH ALCOHOL AND CONTINUE CYCLE 10/24/19   Candelaria StagersPatel, Kevin P, DPM  furosemide (LASIX) 20 MG tablet TK 1 T PO QD PRN. 12/04/16    [provider]  gabapentin (NEURONTIN) 300 MG capsule TAKE 1 CAPSULE BY MOUTH AT BEDTIME 11/06/16   Stegmayer, Cala BradfordKimberly A, PA-C  Multiple Vitamin (MULTIVITAMIN WITH MINERALS) TABS tablet Take 1 tablet by mouth every evening.    [provider]  Omega-3 Fatty Acids (FISH OIL) 1200 MG CAPS Take 2,400 mg by mouth 2 (two) times daily.    [provider]  oxyCODONE (OXY IR/ROXICODONE) 5 MG immediate release tablet Take 1 tablet (5 mg total) by mouth every 4 (four) hours as needed for moderate pain. Patient not taking: Reported on 10/21/2019 12/29/15   Annice Needyew, Jason S, MD  rosuvastatin (CRESTOR) 20 MG tablet Take 20 mg by mouth daily.    [provider]  sertraline (ZOLOFT) 100 MG tablet TK 1 T PO QD 11/15/17   [provider]  Specialty Vitamins Products (ONE-A-DAY PROSTATE PO) Take 1 tablet by mouth daily. "Super Beta Prostate"    [provider]  tamsulosin (FLOMAX) 0.4 MG CAPS capsule Take 0.4 mg by mouth daily. 06/05/19   [provider]  Zn-Pyg Afri-Nettle-Saw Palmet (SAW PALMETTO COMPLEX PO) Take 2 tablets by mouth daily.    [provider]    Physical Exam: Vitals:   11/21/19 2051 11/21/19 2053 11/21/19 2133 11/21/19 2227  BP: (!) 156/75  (!) 155/80   Pulse: 81  76   Resp: 20  17   Temp: 98.8 F (37.1 C)  TempSrc: Oral     SpO2: 95%  98% 100%  Weight:  72.6 kg    Height:  5\' 8"  (1.727 m)      GENERAL: 64 y.o.-year-old male patient, well-developed, well-nourished lying in the bed in no acute distress.  Pleasant and cooperative.   HEENT: Head atraumatic, normocephalic. Pupils equal. Mucus membranes moist. NECK: Supple. No JVD. CHEST: Normal breath sounds bilaterally. No wheezing, rales, rhonchi or crackles. No use of accessory muscles of respiration.  No reproducible chest wall tenderness.  CARDIOVASCULAR: S1, S2 normal. No murmurs, rubs, or gallops. Cap refill <2 seconds. Pulses intact distally.  ABDOMEN: Soft,  nondistended, nontender. No rebound, guarding, rigidity. Normoactive bowel sounds present in all four quadrants.  GU: Foley in place with red / bloody urine. EXTREMITIES: Doppler pulses appreciated.  Right lower extremity warm and with normal color.  No pedal edema, cyanosis, or clubbing. No calf tenderness or Homan's sign.  NEUROLOGIC: The patient is alert and oriented x 3. Cranial nerves II through XII are grossly intact with no focal sensorimotor deficit. PSYCHIATRIC:  Normal affect, mood, thought content. SKIN: Warm, dry, and intact without obvious rash, lesion, or ulcer.    Labs on Admission:  CBC: Recent Labs  Lab 11/21/19 2101  WBC 12.1*  NEUTROABS 10.2*  HGB 13.8  HCT 40.1  MCV 91.1  PLT 180   Basic Metabolic Panel: Recent Labs  Lab 11/21/19 2101  NA 135  K 3.9  CL 97*  CO2 25  GLUCOSE 150*  BUN 18  CREATININE 1.13  CALCIUM 9.7   GFR: Estimated Creatinine Clearance: 64.7 mL/min (by C-G formula based on SCr of 1.13 mg/dL). Liver Function Tests: No results for input(s): AST, ALT, ALKPHOS, BILITOT, PROT, ALBUMIN in the last 168 hours. No results for input(s): LIPASE, AMYLASE in the last 168 hours. No results for input(s): AMMONIA in the last 168 hours. Coagulation Profile: Recent Labs  Lab 11/21/19 2101  INR 1.0   Cardiac Enzymes: No results for input(s): CKTOTAL, CKMB, CKMBINDEX, TROPONINI in the last 168 hours. BNP (last 3 results) No results for input(s): PROBNP in the last 8760 hours. HbA1C: No results for input(s): HGBA1C in the last 72 hours. CBG: No results for input(s): GLUCAP in the last 168 hours. Lipid Profile: No results for input(s): CHOL, HDL, LDLCALC, TRIG, CHOLHDL, LDLDIRECT in the last 72 hours. Thyroid Function Tests: No results for input(s): TSH, T4TOTAL, FREET4, T3FREE, THYROIDAB in the last 72 hours. Anemia Panel: No results for input(s): VITAMINB12, FOLATE, FERRITIN, TIBC, IRON, RETICCTPCT in the last 72 hours. Urine analysis: No  results found for: COLORURINE, APPEARANCEUR, LABSPEC, PHURINE, GLUCOSEU, HGBUR, BILIRUBINUR, KETONESUR, PROTEINUR, UROBILINOGEN, NITRITE, LEUKOCYTESUR Sepsis Labs: @LABRCNTIP (procalcitonin:4,lacticidven:4) ) Recent Results (from the past 240 hour(s))  SARS Coronavirus 2 by RT PCR (hospital order, performed in [facility] hospital lab) Nasopharyngeal Nasopharyngeal Swab     Status: None   Collection Time: 11/21/19  9:30 PM   Specimen: Nasopharyngeal Swab  Result Value Ref Range Status   SARS Coronavirus 2 NEGATIVE NEGATIVE Final    Comment: (NOTE) SARS-CoV-2 target nucleic acids are NOT DETECTED.  The SARS-CoV-2 RNA is generally detectable in upper and lower respiratory specimens during the acute phase of infection. The lowest concentration of SARS-CoV-2 viral copies this assay can detect is 250 copies / mL. A negative result does not preclude SARS-CoV-2 infection and should not be used as the sole basis for treatment or other patient management decisions.  A negative result may occur with improper specimen collection /  handling, submission of specimen other than nasopharyngeal swab, presence of viral mutation(s) within the areas targeted by this assay, and inadequate number of viral copies (<250 copies / mL). A negative result must be combined with clinical observations, patient history, and epidemiological information.  Fact Sheet for Patients:   BoilerBrush.com.cy  Fact Sheet for Healthcare Providers: https://pope.com/  This test is not yet approved or  cleared by the [place] FDA and has been authorized for detection and/or diagnosis of SARS-CoV-2 by FDA under an Emergency Use Authorization (EUA).  This EUA will remain in effect (meaning this test can be used) for the duration of the COVID-19 declaration under Section 564(b)(1) of the Act, 21 U.S.C. section 360bbb-3(b)(1), unless the authorization is terminated or revoked  sooner.  Performed at [facility], [address], [place], [place] [address]      Radiological Exams on Admission: No results found.  EKG: Normal sinus rhythm at 75  bpm with normal axis and nonspecific ST-T wave changes.   Assessment/Plan  This is a 64 y.o. male with a history of collagen vascular disease, hypertension, hyperlipidemia, BPH,  previous limb ischemia now being admitted with:  #.  Acute limb ischemia postop day #0 status post TPA lysis and balloon angioplasty of occluded graft -Admit to ICU - NPO after 4am for return to cath lab tomorrow.  -Pain control -Heparin drip  # Hematuria likely 2/2 TPA - Maintain foley cath - Monitor, may need urology consult  #. EtOH use disorder, moderate with possible withdrawal - Standing Ativan to prevent DTs - Oral vitamins.   #.  History of hypertension -Reconcile meds and resume Lotrel, Lasix  #.  History of hyperlipidemia -Resume Crestor  #. History of BPH - Continue Flomax  Admission status: Inpatient, ICU IV Fluids: Hep-Lock Diet/Nutrition: Heart healthy, then NPO after 4am.  Consults called: Vascular surgery DVT Px: Heparin per vascular surgery.  Early ambulation. Code Status: Full Code  Disposition Plan: To home in 1-2 days  All the records are reviewed and case discussed with ED provider. Management plans discussed with the patient and/or family who express understanding and agree with plan of care.  Laelynn Blizzard D.O. on 11/22/2019 at 12:45 AM CC: Primary care physician; Jaclyn Shaggy, MD   11/22/2019, 12:45 AM

## 2019-11-22 NOTE — Progress Notes (Signed)
Subjective:  Patient ID: Travis Salas, male    DOB: [birth year],  MRN: [id number]  Chief Complaint  Patient presents with   Callouses    "its doing a little better.  My wife shaved it down about 3-4 days ago and it doesn't burn as bad"    64 y.o. male presents with the above complaint.  Patient presents with a follow-up of left hallux preulcerative lesion which has resolved completely.  Patient states the orthotics have helped tremendously.  He has been able to do a lot better with pain.  He denies any other acute complaints.  He has been utilizing his Penlac and wearing orthotics.  I discussed with him to continue wearing orthotics for the rest of the life.  If it gets worn out come back and see [place].  Patient states understanding   Review of Systems: Negative except as noted in the HPI. Denies N/V/F/Ch.  Past Medical History:  Diagnosis Date   Collagen vascular disease (HCC)    Hyperlipidemia    Hypertension    Peripheral vascular disease (HCC)    No current facility-administered medications for this visit. No current outpatient medications on file.  Facility-Administered Medications Ordered in Other Visits:    0.9 %  sodium chloride infusion, 250 mL, Intravenous, Once, Arbid, Gladstone Pih, MD   0.9 %  sodium chloride infusion, , Intravenous, PRN, Lurene Shadow, MD, Last Rate: 10 mL/hr at 11/22/19 0819, 250 mL at 11/22/19 0819   acetaminophen (TYLENOL) tablet 650 mg, 650 mg, Oral, Q6H PRN **OR** acetaminophen (TYLENOL) suppository 650 mg, 650 mg, Rectal, Q6H PRN, Hugelmeyer, Alexis, DO   alteplase (LIMB ISCHEMIA) 10 mg in normal saline (0.02 mg/mL) infusion, 1 mg/hr, Intracatheter, Continuous, Arbid, Gladstone Pih, MD, Last Rate: 25 mL/hr at 11/22/19 0814, 0.5 mg/hr at 11/22/19 0814   alteplase (LIMB ISCHEMIA) 10 mg in normal saline (0.02 mg/mL) infusion, 0.5 mg/hr, Intracatheter, Continuous, Arbid, Gladstone Pih, MD, Last Rate: 25 mL/hr at 11/22/19 0821, 0.5 mg/hr at 11/22/19 0821   alum &  mag hydroxide-simeth (MAALOX/MYLANTA) 200-200-20 MG/5ML suspension 30 mL, 30 mL, Oral, Q4H PRN, Tukov-Yual, Magdalene S, NP   bisacodyl (DULCOLAX) EC tablet 5 mg, 5 mg, Oral, Daily PRN, Hugelmeyer, Alexis, DO   Chlorhexidine Gluconate Cloth 2 % PADS 6 each, 6 each, Topical, Daily, [place], Hubbard Hartshorn, NP   fentaNYL (SUBLIMAZE) 100 MCG/2ML injection, , , ,    folic acid (FOLVITE) tablet 1 mg, 1 mg, Oral, Daily, Hugelmeyer, Alexis, DO, 1 mg at 11/22/19 0932   heparin ADULT infusion 100 units/mL (25000 units/280mL sodium chloride 0.45%), 300 Units/hr, Intravenous, Continuous, Hall, Scott A, RPH, Last Rate: 3 mL/hr at 11/22/19 0800, 300 Units/hr at 11/22/19 0800   heparin sodium (porcine) 1000 UNIT/ML injection, , , ,    heparin sodium (porcine) 1000 UNIT/ML injection, , , ,    hydrALAZINE (APRESOLINE) 20 MG/ML injection, , , ,    hydrALAZINE (APRESOLINE) 20 MG/ML injection, , , ,    LORazepam (ATIVAN) tablet 1-4 mg, 1-4 mg, Oral, Q1H PRN **OR** LORazepam (ATIVAN) injection 1-4 mg, 1-4 mg, Intravenous, Q1H PRN, Hugelmeyer, Alexis, DO   LORazepam (ATIVAN) tablet 1 mg, 1 mg, Oral, Q6H, Hugelmeyer, Alexis, DO, 1 mg at 11/22/19 0814   melatonin tablet 5 mg, 5 mg, Oral, QHS, Tukov-Yual, Magdalene S, NP   midazolam (VERSED) 2 MG/2ML injection, , , ,    midazolam (VERSED) 2 MG/2ML injection, , , ,    morphine 2 MG/ML injection 2 mg, 2 mg, Intravenous, Q4H PRN, Hugelmeyer,  Alexis, DO   multivitamin with minerals tablet 1 tablet, 1 tablet, Oral, Daily, Hugelmeyer, Alexis, DO, 1 tablet at 11/22/19 0932   ondansetron (ZOFRAN) tablet 4 mg, 4 mg, Oral, Q6H PRN **OR** ondansetron (ZOFRAN) injection 4 mg, 4 mg, Intravenous, Q6H PRN, Hugelmeyer, Alexis, DO   ondansetron (ZOFRAN) injection 4 mg, 4 mg, Intravenous, Q6H PRN, Griffith Citron, MD, 4 mg at 11/22/19 0115   pantoprazole (PROTONIX) injection 40 mg, 40 mg, Intravenous, Daily, Tukov-Yual, Magdalene S, NP, 40 mg at 11/22/19 0517   potassium  chloride 10 mEq in 100 mL IVPB, 10 mEq, Intravenous, Q1 Hr x 2, Tukov-Yual, Magdalene S, NP, Last Rate: 100 mL/hr at 11/22/19 0931, 10 mEq at 11/22/19 0931   senna-docusate (Senokot-S) tablet 1 tablet, 1 tablet, Oral, QHS PRN, Hugelmeyer, Alexis, DO   sodium chloride flush 0.9 % injection, , , ,    thiamine tablet 100 mg, 100 mg, Oral, Daily, Hugelmeyer, Alexis, DO, 100 mg at 11/22/19 0932  Social History   Tobacco Use  Smoking Status Former Smoker   Packs/day: 1.50   Years: 46.00   Pack years: 69.00   Types: Cigarettes   Quit date: 02/05/2015   Years since quitting: 4.7  Smokeless Tobacco Never Used    Allergies  Allergen Reactions   Hydrocodone Swelling   Objective:  There were no vitals filed for this visit. There is no height or weight on file to calculate BMI. Constitutional Well developed. Well nourished.  Vascular Dorsalis pedis pulses palpable bilaterally. Posterior tibial pulses palpable bilaterally. Capillary refill normal to all digits.  No cyanosis or clubbing noted. Pedal hair growth normal.  Neurologic Normal speech. Oriented to person, place, and time. Epicritic sensation to light touch grossly present bilaterally.  Dermatologic  right hallux thickened elongated mycotic dystrophic toenails x1.  Mild pain on palpation No open wounds. No skin lesions.  Orthopedic:  No pain on palpation to the left hallux preulcerative lesion.  After debridement no pinpoint bleeding noted.  Hyperkeratotic nature of the lesion has resolved no underlying ulceration noted.  No clinical signs of infection noted.  No erythema noted.   Radiographs: None Assessment:   1. Pes planovalgus   2. Pre-ulcerative calluses   3. Onychomycosis    Plan:  Patient was evaluated and treated and all questions answered.  Left hallux preulcerative lesion -Clinically resolved.  Patient has been able to control it really well with orthotics. -I discussed with him the importance of  continue wearing orthotics to prevent this preulcerative lesion.  Good offloading noted and built into the orthotics.  Rigid pes planus -I explained to the patient the etiology of pes planus and various treatment options were extensively discussed.  Given that he has rigid pes planus with IPJ hyperkeratotic lesion I believe he will benefit from aggressive offloading of the lesion with control of the hindfoot as well as support of the arch.  He will benefit from custom-made orthotics and plan on offloading the hallux IPJ of the left foot -Orthotics were evaluated.  Good gait mechanics noted.  Right hallux onychomycosis -Continue utilizing Penlac.   No follow-ups on file.

## 2019-11-22 NOTE — Progress Notes (Signed)
Progress Note    Travis Salas  RWE:[id number] DOB: 1955/12/12  DOA: 11/21/2019 PCP: Albina Billet, MD      Brief Narrative:    Medical records reviewed and are as summarized below:  Travis Salas is a 64 y.o. male       Assessment/Plan:   Active Problems:   Lower limb ischemia  PVD with acute right lower extremity ischemia: s/p mechanical thrombectomy and thrombolytic therapy through right femoral artery into the tibioperoneal trunk 11/21/2019.  Continue IV heparin infusion and monitor heparin level per protocol.  Analgesics as needed for pain.  Resume Crestor.  Plan for further vascular intervention today by vascular surgeon.  Hematuria: Improved  Hypotension: Hold antihypertensives  Alcohol use disorder with possible withdrawal syndrome: Ativan as needed per CIWA protocol.  BPH: Resume Flomax tomorrow.   Body mass index is 25.14 kg/m.  Diet Order            Diet NPO time specified Except for: Sips with Meds  Diet effective now                       Medications:   . Chlorhexidine Gluconate Cloth  6 each Topical Daily  . fentaNYL      . fentaNYL      . folic acid  1 mg Oral Daily  . heparin sodium (porcine)      . heparin sodium (porcine)      . hydrALAZINE      . LORazepam  1 mg Oral Q6H  . melatonin  5 mg Oral QHS  . midazolam      . midazolam      . multivitamin with minerals  1 tablet Oral Daily  . pantoprazole (PROTONIX) IV  40 mg Intravenous Daily  . sodium chloride flush      . thiamine  100 mg Oral Daily   Continuous Infusions: . sodium chloride    . sodium chloride 250 mL (11/22/19 0819)  . alteplase (LIMB ISCHEMIA) 10 mg in normal saline (0.02 mg/mL) infusion 0.5 mg/hr (11/22/19 0814)  . alteplase (LIMB ISCHEMIA) 10 mg in normal saline (0.02 mg/mL) infusion 0.5 mg/hr (11/22/19 0821)  .  ceFAZolin (ANCEF) IV    . heparin 300 Units/hr (11/22/19 0800)     Anti-infectives (From admission, onward)   Start     Dose/Rate  Route Frequency Ordered Stop   11/22/19 1100  ceFAZolin (ANCEF) IVPB 2g/100 mL premix     Discontinue     2 g 200 mL/hr over 30 Minutes Intravenous  Once 11/22/19 1046     11/21/19 2245  ceFAZolin (ANCEF) IVPB 2g/100 mL premix  Status:  Discontinued        2 g 200 mL/hr over 30 Minutes Intravenous  Once 11/21/19 2234 11/22/19 0137   11/21/19 2209  ceFAZolin (ANCEF) 2-4 GM/100ML-% IVPB       Note to Pharmacy: Carlynn Spry   : cabinet override      11/21/19 2209 11/21/19 2227             Family Communication/Anticipated D/C date and plan/Code Status   DVT prophylaxis: On IV heparin infusion     Code Status: Full Code  Family Communication: Plan discussed with the patient Disposition Plan:    Status is: Inpatient  Remains inpatient appropriate because:Ongoing diagnostic testing needed not appropriate for outpatient work up and Inpatient level of care appropriate due to severity of illness   Dispo:  The patient is from: Home              Anticipated d/c is to: Home              Anticipated d/c date is: 2 days              Patient currently is not medically stable to d/c.           Subjective:   No pain in the right foot.  He feels better.  Objective:    Vitals:   11/22/19 0600 11/22/19 0700 11/22/19 0800 11/22/19 0900  BP: 93/70 109/76 104/74 114/68  Pulse: 79 85 81 77  Resp: 16 19 (!) 25 (!) 27  Temp:   98.3 F (36.8 C)   TempSrc:   Oral   SpO2: 94% 96% 93% 98%  Weight:      Height:       No data found.   Intake/Output Summary (Last 24 hours) at 11/22/2019 1051 Last data filed at 11/22/2019 0800 Gross per 24 hour  Intake 938.11 ml  Output 1875 ml  Net -936.89 ml   Filed Weights   11/21/19 2053 11/22/19 0150  Weight: 72.6 kg 75 kg    Exam:  GEN: NAD SKIN: Bloodstained dressing on left groin where he has a left femoral triple-lumen catheter. EYES: No pallor or icterus ENT: MMM CV: RRR PULM: CTA B ABD: soft, ND, NT, +BS CNS: AAO  x 3, non focal EXT: No edema or tenderness.  Bilateral feet are warm to touch.   Data Reviewed:   I have personally reviewed following labs and imaging studies:  Labs: Labs show the following:   Basic Metabolic Panel: Recent Labs  Lab 11/21/19 2101 11/21/19 2101 11/22/19 0156 11/22/19 0320  NA 135  --  137 136  K 3.9   < > 3.7 3.0*  CL 97*  --  102 104  CO2 25  --  23 21*  GLUCOSE 150*  --  137* 150*  BUN 18  --  17 15  CREATININE 1.13  --  1.10 1.03  CALCIUM 9.7  --  8.3* 7.8*  MG  --   --  1.8  --   PHOS  --   --  3.0  --    < > = values in this interval not displayed.   GFR Estimated Creatinine Clearance: 71 mL/min (by C-G formula based on SCr of 1.03 mg/dL). Liver Function Tests: Recent Labs  Lab 11/22/19 0156  AST 49*  ALT 27  ALKPHOS 63  BILITOT 1.3*  PROT 6.4*  ALBUMIN 4.1   No results for input(s): LIPASE, AMYLASE in the last 168 hours. No results for input(s): AMMONIA in the last 168 hours. Coagulation profile Recent Labs  Lab 11/21/19 2101 11/22/19 0320  INR 1.0 1.3*    CBC: Recent Labs  Lab 11/21/19 2101 11/22/19 0156 11/22/19 0320  WBC 12.1* 13.2* 11.6*  NEUTROABS 10.2*  --   --   HGB 13.8 12.3* 12.3*  HCT 40.1 35.7* 36.0*  MCV 91.1 90.8 91.4  PLT 180 160 141*   Cardiac Enzymes: No results for input(s): CKTOTAL, CKMB, CKMBINDEX, TROPONINI in the last 168 hours. BNP (last 3 results) No results for input(s): PROBNP in the last 8760 hours. CBG: Recent Labs  Lab 11/22/19 0149  GLUCAP 130*   D-Dimer: No results for input(s): DDIMER in the last 72 hours. Hgb A1c: No results for input(s): HGBA1C in the last 72 hours. Lipid Profile:  No results for input(s): CHOL, HDL, LDLCALC, TRIG, CHOLHDL, LDLDIRECT in the last 72 hours. Thyroid function studies: No results for input(s): TSH, T4TOTAL, T3FREE, THYROIDAB in the last 72 hours.  Invalid input(s): FREET3 Anemia work up: No results for input(s): VITAMINB12, FOLATE, FERRITIN, TIBC,  IRON, RETICCTPCT in the last 72 hours. Sepsis Labs: Recent Labs  Lab 11/21/19 2101 11/22/19 0156 11/22/19 0320  WBC 12.1* 13.2* 11.6*    Microbiology Recent Results (from the past 240 hour(s))  MRSA PCR Screening     Status: None   Collection Time: 11/21/19  8:03 PM   Specimen: Nasal Swab (BD Veritor Kit); Nasopharyngeal  Result Value Ref Range Status   MRSA by PCR NEGATIVE NEGATIVE Final    Comment:        The GeneXpert MRSA Assay (FDA approved for NASAL specimens only), is one component of a comprehensive MRSA colonization surveillance program. It is not intended to diagnose MRSA infection nor to guide or monitor treatment for MRSA infections. Performed at [facility], [address] [place], [place] [address]   SARS Coronavirus 2 by RT PCR (hospital order, performed in [facility] hospital lab) Nasopharyngeal Nasopharyngeal Swab     Status: None   Collection Time: 11/21/19  9:30 PM   Specimen: Nasopharyngeal Swab  Result Value Ref Range Status   SARS Coronavirus 2 NEGATIVE NEGATIVE Final    Comment: (NOTE) SARS-CoV-2 target nucleic acids are NOT DETECTED.  The SARS-CoV-2 RNA is generally detectable in upper and lower respiratory specimens during the acute phase of infection. The lowest concentration of SARS-CoV-2 viral copies this assay can detect is 250 copies / mL. A negative result does not preclude SARS-CoV-2 infection and should not be used as the sole basis for treatment or other patient management decisions.  A negative result may occur with improper specimen collection / handling, submission of specimen other than nasopharyngeal swab, presence of viral mutation(s) within the areas targeted by this assay, and inadequate number of viral copies (<250 copies / mL). A negative result must be combined with clinical observations, patient history, and epidemiological information.  Fact Sheet for Patients:    StrictlyIdeas.no  Fact Sheet for Healthcare Providers: BankingDealers.co.za  This test is not yet approved or  cleared by the [place] FDA and has been authorized for detection and/or diagnosis of SARS-CoV-2 by FDA under an Emergency Use Authorization (EUA).  This EUA will remain in effect (meaning this test can be used) for the duration of the COVID-19 declaration under Section 564(b)(1) of the Act, 21 U.S.C. section 360bbb-3(b)(1), unless the authorization is terminated or revoked sooner.  Performed at [facility], [address] [place], [place] [address]     Procedures and diagnostic studies:  No results found.             LOS: 0 days   Ellee Wawrzyniak  [facility]     11/22/2019, 10:51 AM

## 2019-11-22 NOTE — Progress Notes (Signed)
ANTICOAGULATION CONSULT NOTE  Pharmacy Consult for Heparin Indication: limb ischemia  Patient Measurements: Height: 5\' 8"  (172.7 cm) Weight: 75 kg (165 lb 5.5 oz) IBW/kg (Calculated) : 68.4  Vital Signs: Temp: 98.9 F (37.2 C) (07/10 2000) Temp Source: Oral (07/10 2000) BP: 100/74 (07/10 2000) Pulse Rate: 74 (07/10 2000)  Labs: Recent Labs    11/21/19 2101 11/21/19 2101 11/22/19 0156 11/22/19 0320 11/22/19 1318 11/22/19 2028  HGB 13.8   < > 12.3* 12.3*  --   --   HCT 40.1  --  35.7* 36.0*  --   --   PLT 180  --  160 141*  --   --   APTT  --   --   --  >160* 44* 31  LABPROT 12.7  --   --  15.3*  --   --   INR 1.0  --   --  1.3*  --   --   HEPARINUNFRC  --   --   --   --  <0.10*  --   CREATININE 1.13  --  1.10 1.03  --   --    < > = values in this interval not displayed.    Estimated Creatinine Clearance: 71 mL/min (by C-G formula based on SCr of 1.03 mg/dL).   Medical History: Past Medical History:  Diagnosis Date  . Collagen vascular disease (HCC)   . Hyperlipidemia   . Hypertension   . Peripheral vascular disease (HCC)     Medications:  Medications Prior to Admission  Medication Sig Dispense Refill Last Dose  . amLODipine-benazepril (LOTREL) 5-20 MG capsule Take 1 capsule by mouth 2 (two) times daily.   5   . apixaban (ELIQUIS) 5 MG TABS tablet Take 1 tablet (5 mg total) by mouth 2 (two) times daily. 60 tablet 5   . aspirin EC 81 MG EC tablet Take 1 tablet (81 mg total) by mouth daily. (Patient not taking: Reported on 10/21/2019)   Not Taking at Unknown time  . ciclopirox (PENLAC) 8 % solution APPLY OVER NAIL AND SURROUNDING SKIN DAILY AT BEDTIME OVER PREVIOUS COAT. AFTER 7 DAYS, MAY REMOVE WITH ALCOHOL AND CONTINUE CYCLE 6.6 mL 0   . furosemide (LASIX) 20 MG tablet TK 1 T PO QD PRN.  2   . gabapentin (NEURONTIN) 300 MG capsule TAKE 1 CAPSULE BY MOUTH AT BEDTIME 60 capsule 0   . Multiple Vitamin (MULTIVITAMIN WITH MINERALS) TABS tablet Take 1 tablet by mouth  every evening.     . Omega-3 Fatty Acids (FISH OIL) 1200 MG CAPS Take 2,400 mg by mouth 2 (two) times daily.     12/21/2019 oxyCODONE (OXY IR/ROXICODONE) 5 MG immediate release tablet Take 1 tablet (5 mg total) by mouth every 4 (four) hours as needed for moderate pain. (Patient not taking: Reported on 10/21/2019) 30 tablet 0 Not Taking at Unknown time  . rosuvastatin (CRESTOR) 20 MG tablet Take 20 mg by mouth daily.     . sertraline (ZOLOFT) 100 MG tablet TK 1 T PO QD  3   . Specialty Vitamins Products (ONE-A-DAY PROSTATE PO) Take 1 tablet by mouth daily. "Super Beta Prostate"     . tamsulosin (FLOMAX) 0.4 MG CAPS capsule Take 0.4 mg by mouth daily.     12/21/2019 Zn-Pyg Afri-Nettle-Saw Palmet (SAW PALMETTO COMPLEX PO) Take 2 tablets by mouth daily.       Assessment: 64 y.o. male with a known history of collagen vascular disease, hypertension, hyperlipidemia, BPH,  previous  limb ischemia on apixaban presents to the emergency department for evaluation of sudden onset of right lower extremity pain. Heparin initiated in cath lab at 500 units/hr. Previous supratherapeutic aPTT though to be an error based on TPA co-infusion (re-enforced by heparin infusion data on previous admissions requiring 750 units/hr). He is s/p RLE angiogram and angioplasty today. Additionally this afternoon's aPTT and anti-Xa levels were drawn after heparin was stopped resulting in conflicting and incomplete information. H&H, platelets are noted to be trending down.  7/10 2028 APTT 33 sec. Bolus of 2000 units x1, increase rate to 700 units/hr  Goal of Therapy:  aPTT 66-102 seconds Heparin level 0.3 - 0.7 IU.mL Monitor platelets by anticoagulation protocol: Yes   Plan:   Subtherapeutic. Spoke with Vascular Surgeon, stated that patient is stable now and can receive bolus infusion regarding heparin. Bolus 2000 units x 1, increasing rate to 700 units/hr  check aPTT and anti-Xa level with AM labs  aPTT will be used to guide therapy (given recent  apixaban administration PTA) until anti-Xa and aPTT correlate  CBC in am  Monitor for s/sx bleeding complications  Velinda Wrobel A Kimble Delaurentis 11/22/2019,9:53 PM

## 2019-11-22 NOTE — Plan of Care (Signed)

## 2019-11-22 NOTE — Op Note (Signed)
DATE OF SURGERY: 11/21/2019   Pre-operative Diagnosis: PAD with rest pain right lower extremity, acute on chronic ischemia with previous surgical bypass in the right leg  Post-operative diagnosis: Same  Procedure(s) Performed: 1. Ultrasound guidance for vascular access left femoral artery 2. Catheter placement into right anterior tibial and posterior tibial arteries from left femoral approach 3. Aortogram and selective right lower extremity angiogram occluding selective injection of the rightantrerior, posterior tibial and peroneal arteries 4. Catheter directed thrombolytic therapy with a total of 8mg  TPA instilled in the right femoral to distal bypass 5. Mechanical thrombectomy using the angiojet to the right common femoral artery, femoral to tibioperoneal trunk bypass, and peroneal arteries             6.  Placement of infusion catheter for continuous thrombolytic therapy using a 135 cm total length 50 cm working length catheter from the right common femoral artery through the bypass and into the tibioperoneal trunk 7. Ultrasound guidance vascular access left femoral vein             8.  Placement of a left femoral triple-lumen catheter placement for durable venous access using ultrasound fluoroscopic guidance  EBL: 300 cc  Contrast: 85 cc  Fluoro Time:15.8 minutes  Moderate Conscious Sedation Time: approximately 90 minutes using 4 mg of Versed and 50 mcg of Fentanyl  Indications: Patient is a 63 y.o.male with 8 hours of rest pain and a cold right foot with signs of acute ischemia.  He has previously had femoral to distal as well as a jump graft bypass to an additional tibial vessel for rest pain about 3 to 4 years ago. The patient is brought in for angiography for further evaluation and potential treatment.  Due to the limb threatening nature of the situation, angiogram was performed for  attempted limb salvage. The patient is aware that if the procedure fails, amputation would be expected.  The patient also understands that even with successful revascularization, amputation may still be required due to the severity of the situation.  Risks and benefits are discussed and informed consent is obtained.   Procedure: The patient was identified and appropriate procedural time out was performed. The patient was then placed supine on the table and prepped and draped in the usual sterile fashion.Moderate conscious sedation was administered during a face to face encounter with the patient throughout the procedure with my supervision of the RN administering medicines and monitoring the patient's vital signs, pulse oximetry, telemetry and mental status throughout from the start of the procedure until the patient was taken to the recovery room. Ultrasound was used to evaluate the left common femoral artery. It was patent although modertely diseased. A digital ultrasound image was acquired. A Seldinger needle was used to access the left common femoral artery under direct ultrasound guidance and a permanent image was performed. A 0.035 glidewire was advanced without resistance and a 5Fr sheath was placed.Rim catheter was placed into the aorta and an AP aortogram was performed. The renal arteries were not well seen but this may have been due to catheter position.  The aorta was patent.  The left iliac system had mild disease that did not appear flow-limiting.  The right common iliac artery was patent but the proximal external iliac artery had some degree of stenosis although it was difficult to ascertain on today's study.  This did appear to be in the 50 to 60% range.  I then crossed the aortic bifurcation and advanced to the right  femoral head. Selective right lower extremity angiogram was then performed. This demonstrated occlusion of the femoral to distal bypass with very poor distal reconstitution.   Tibial vessels really not seen on initial imaging and appeared to have clot embolization. The foot was very white with no capillary refill.  The profunda femoris artery had about a 90% stenosis at its origin.  The native SFA was chronically occluded. It was felt that it was in the patient's best interest to proceed with intervention after these images to avoid a second procedure and a larger amount of contrast and fluoroscopy based off of the findings from the initial angiogram. The patient was systemically heparinized and a 6 [place] destination sheath was then placed over the stiff glide wire. I then used a Kumpe catheter and the advantage wire to easily navigate into the occluded bypass graft and down through the bypass graft.  The Kumpe catheter was advanced into the proximal peroneal artery where selective imaging showed a focal stenosis in the mid peroneal artery in the 50% range but otherwise this was continuous down distally.  I then began by using the angiojet with a total of 10 mg of tPA using power pulse into  the occluded bypass graft.  Several passes were made with the angiojet  throughout the bypass graft and down into the tibioperoneal trunk and proximal peroneal artery.  imaging showed significant amount of residual thrombus present throughout the bypass graft as well as a blob of thrombus in the common femoral artery and at the distal anastomosis.  The angiojet was brought back onto the field and runs in the common femoral artery in the proximal portion of the bypass where the thrombus was most stents were performed. significant  Improvement was noted with clearing of most thrombus from the graft with small residual thrombus at the distal anastomosis. there was still a significant amount of thrombus within the bypass graft.  I felt our best chance for restoring patency would be to access the ATA and PTA and attempt lyis of distal clot as there was no perfusion of the foot beyond the mid tibial  arteries. selective catheterization of the ATA and PTA were then sequentially performed and 2 mg of tPA instilled in each artery over ten minutes. Repeat angio revealed improved flow to the ankle but no flow in foot. I then selected the PTA down int to the arch of the foot and 1mg  of tPA was instilled in the foot with nitroglycerin. A 2.5 mm angioplasty was performed of the medial plantar artery and repeat angio showed patent dorsal arch with filling of the digital vessels with retrograde filling of the ATA. Final angio demonstrated residual thrombus in the anastomosis and a filling defect  in the femoral to tibial graft . This ws treated with a 18mm balloon angioplasty with resolution of the narrowing in the graft. Slow however was still sluggish into the foot and I was concerned about the residual thrombus at the anastomosis. I therefore elected to do continuous thrombolytic therapy with TPA and I placed a 135 cm total length 50 cm working length thrombolytic catheter in the bypass graft with the most proximal segment just into the common femoral artery and the most distal segment in the tibioperoneal trunk.  It was secured in place with a Prolene suture as was the sheath.  To allow durable venous access including lab draws while he is on thrombolytic therapy, central line was placed.  The left femoral vein was visualized with ultrasound  and found to be widely patent.  It was then accessed under direct ultrasound guidance without difficulty with a Seldinger needle.  A J-wire was placed.  After skin nick and dilatation a triple-lumen catheter was placed over the wire and the wire was removed.  All 3 lm withdrew dark red nonpulsatile blood and flushed easily with sterile saline.  Fluoroscopy was used to show the catheter tip at the distal IVC without kinks in the catheter.  The catheter was secured with 2 silk sutures to the skin. I elected to terminate the procedure. The patient was taken to the recovery room in  stable condition having tolerated the procedure well.  Findings:  Aortogram: The renal arteries were not well seen but this may have been due to catheter position.  The aorta was patent.  The left iliac system had mild disease that did not appear flow-limiting.  The right common iliac artery was patent but the proximal external iliac artery had some degree of stenosis although it was difficult to ascertain on today's study.  This did appear to be in the 50 to 60% range.which will be further evaluated during the f/u procedure. Right lower Extremity:Occlusion of the femoral to distal bypass with very poor distal reconstitution.  Tibial vessels really not seen on initial imaging.  The profunda femoris artery had about a 90% stenosis at its origin.  The native SFA was chronically occluded.   Disposition: Patient was taken to the recovery room in stable condition having tolerated the procedure well.  Complications: None

## 2019-11-22 NOTE — OR Nursing (Signed)
Foley bloody so Dr Consuello Closs ordered ns to increase to 500 ml bolus prior to departure from vascular lab

## 2019-11-22 NOTE — Progress Notes (Signed)
Progress Note   Date of Surgery: 11/21/2019 - 11/22/2019 Procedures:  Procedure(s): BYPASS GRAFT ANGIOGRAPHY PERIPHERAL VASCULAR THROMBECTOMY Surgeon: Surgeon(s): Griffith Citron, MD  1 Day Post-Op  History of Present Illness  Travis Salas is a 64 y.o. male who is 1 Day Post-Op. The patient's pre-op symptoms ofsevere ischemia of the right foot  are Improved . Patients pain is well controlled.     Imaging: No results found.  Significant Diagnostic Studies: CBC Lab Results  Component Value Date   WBC 11.6 (H) 11/22/2019   HGB 12.3 (L) 11/22/2019   HCT 36.0 (L) 11/22/2019   MCV 91.4 11/22/2019   PLT 141 (L) 11/22/2019    BMET    Component Value Date/Time   NA 136 11/22/2019 0320   K 3.0 (L) 11/22/2019 0320   CL 104 11/22/2019 0320   CO2 21 (L) 11/22/2019 0320   GLUCOSE 150 (H) 11/22/2019 0320   BUN 15 11/22/2019 0320   CREATININE 1.03 11/22/2019 0320   CALCIUM 7.8 (L) 11/22/2019 0320   GFRNONAA >60 11/22/2019 0320   GFRAA >60 11/22/2019 0320    COAG Lab Results  Component Value Date   INR 1.3 (H) 11/22/2019   INR 1.0 11/21/2019   INR 1.24 12/21/2015   No results found for: PTT  Physical Examination  BP Readings from Last 3 Encounters:  11/22/19 93/70  10/21/19 130/74  07/15/19 103/71   Temp Readings from Last 3 Encounters:  11/22/19 98.1 F (36.7 C) (Oral)  06/21/19 98.3 F (36.8 C) (Oral)  12/29/15 99.1 F (37.3 C) (Oral)   @LASTSAO2 (3)@ Pulse Readings from Last 3 Encounters:  11/22/19 79  10/21/19 73  07/15/19 69    Pt is A&O x 3 left lower extremity: Incision/s is/are clean,dry.intact with some blood under the dressing without hematoma, erythema or drainage Right foot is warm; with improved  Color and 3 sec refill.  Monophasic signal in PT and biphasic signal in DP  Assessment/Plan: Pt. Doing better. Hgb is stable. Post-op pain is controlled Plan is to take back for completion of luysis and treatment of the proximal iliac and  profunda stenosis.   09/14/19, MD  11/22/2019 8:29 AM

## 2019-11-23 LAB — CBC
HCT: 33.4 % — ABNORMAL LOW (ref 39.0–52.0)
Hemoglobin: 11.1 g/dL — ABNORMAL LOW (ref 13.0–17.0)
MCH: 31.1 pg (ref 26.0–34.0)
MCHC: 33.2 g/dL (ref 30.0–36.0)
MCV: 93.6 fL (ref 80.0–100.0)
Platelets: 123 10*3/uL — ABNORMAL LOW (ref 150–400)
RBC: 3.57 MIL/uL — ABNORMAL LOW (ref 4.22–5.81)
RDW: 14.2 % (ref 11.5–15.5)
WBC: 7.5 10*3/uL (ref 4.0–10.5)
nRBC: 0 % (ref 0.0–0.2)

## 2019-11-23 LAB — BASIC METABOLIC PANEL
Anion gap: 7 (ref 5–15)
BUN: 13 mg/dL (ref 8–23)
CO2: 27 mmol/L (ref 22–32)
Calcium: 8.6 mg/dL — ABNORMAL LOW (ref 8.9–10.3)
Chloride: 106 mmol/L (ref 98–111)
Creatinine, Ser: 1.05 mg/dL (ref 0.61–1.24)
GFR calc Af Amer: >60 mL/min (ref >60)
GFR calc non Af Amer: >60 mL/min (ref >60)
Glucose, Bld: 106 mg/dL — ABNORMAL HIGH (ref 70–99)
Potassium: 3.8 mmol/L (ref 3.5–5.1)
Sodium: 140 mmol/L (ref 135–145)

## 2019-11-23 LAB — APTT
aPTT: 49 seconds — ABNORMAL HIGH (ref 24–36)
aPTT: 28 seconds (ref 24–36)

## 2019-11-23 LAB — MAGNESIUM: Magnesium: 2.1 mg/dL (ref 1.7–2.4)

## 2019-11-23 LAB — PHOSPHORUS: Phosphorus: 3 mg/dL (ref 2.5–4.6)

## 2019-11-23 LAB — HEPARIN LEVEL (UNFRACTIONATED): Heparin Unfractionated: 0.17 IU/mL — ABNORMAL LOW (ref 0.30–0.70)

## 2019-11-23 MED ORDER — HEPARIN BOLUS VIA INFUSION
2000.0000 [IU] | Freq: Once | INTRAVENOUS | Status: AC
  Administered 2019-11-23: 2000 [IU] via INTRAVENOUS
  Filled 2019-11-23: qty 2000

## 2019-11-23 MED ORDER — APIXABAN 5 MG PO TABS: 5.0000 mg | ORAL_TABLET | Freq: Two times a day (BID) | ORAL | Status: DC

## 2019-11-23 MED ORDER — APIXABAN 5 MG PO TABS
10.0000 mg | ORAL_TABLET | Freq: Two times a day (BID) | ORAL | Status: DC
  Administered 2019-11-23 – 2019-11-24 (×3): 10 mg via ORAL
  Filled 2019-11-23 (×3): qty 2

## 2019-11-23 NOTE — Plan of Care (Signed)
[name] is progressing with care.

## 2019-11-23 NOTE — Progress Notes (Signed)
Vascular  Progress Note   Date of Surgery: 11/22/2019 Procedures:  Procedure(s): PERIPHERAL VASCULAR THROMBECTOMY Surgeon: Surgeon(s): Griffith Citron, MD  1 Day Post-Op  History of Present Illness  Travis Salas is a 64 y.o. male who is 1 Day Post-Op. The patient's pre-op symptoms of pain are Improved . Patients pain is well controlled.     Imaging: No results found.  Significant Diagnostic Studies: CBC Lab Results  Component Value Date   WBC 7.5 11/23/2019   HGB 11.1 (L) 11/23/2019   HCT 33.4 (L) 11/23/2019   MCV 93.6 11/23/2019   PLT 123 (L) 11/23/2019    BMET    Component Value Date/Time   NA 140 11/23/2019 0446   K 3.8 11/23/2019 0446   CL 106 11/23/2019 0446   CO2 27 11/23/2019 0446   GLUCOSE 106 (H) 11/23/2019 0446   BUN 13 11/23/2019 0446   CREATININE 1.05 11/23/2019 0446   CALCIUM 8.6 (L) 11/23/2019 0446   GFRNONAA >60 11/23/2019 0446   GFRAA >60 11/23/2019 0446    COAG Lab Results  Component Value Date   INR 1.3 (H) 11/22/2019   INR 1.0 11/21/2019   INR 1.24 12/21/2015   No results found for: PTT  Physical Examination  BP Readings from Last 3 Encounters:  11/23/19 (!) 104/54  10/21/19 130/74  07/15/19 103/71   Temp Readings from Last 3 Encounters:  11/23/19 98 F (36.7 C) (Oral)  06/21/19 98.3 F (36.8 C) (Oral)  12/29/15 99.1 F (37.3 C) (Oral)   @LASTSAO2 (3)@ Pulse Readings from Last 3 Encounters:  11/23/19 66  10/21/19 73  07/15/19 69    Pt is A&O x 3  left groin without hematoma Central line in [place Right foot is warm . No sensory or motor deficit Palpable DP and PT   Assessment/Plan: Pt. Doing well Post-op pain is controlled D/c central line Switch to eliquis Ambulate after bed rest from ventral line  09/14/19, MD  11/23/2019 9:36 AM

## 2019-11-23 NOTE — Progress Notes (Signed)
Progress Note    Travis Salas  OMV:[id number] DOB: 03-Nov-1955  DOA: 11/21/2019 PCP: Albina Billet, MD      Brief Narrative:    Medical records reviewed and are as summarized below:  Travis Salas is a 64 y.o. male       Assessment/Plan:   Active Problems:   Lower limb ischemia  PVD with acute right lower extremity ischemia: s/p mechanical thrombectomy and thrombolytic therapy through right femoral artery into the tibioperoneal trunk 11/21/2019. S/p percutaneous transluminal angioplasty of the right profunda femoris artery and right external iliac artery on 11/22/2019.  IV heparin infusion has been discontinued.  Eliquis has been resumed.  Continue Crestor.  Analgesics as needed for pain.   Hematuria: Resolved  Hypotension: BP is better.  Continue to hold antihypertensives  Alcohol use disorder with possible withdrawal syndrome: Ativan as needed per CIWA protocol.  BPH: Continue Flomax   Body mass index is 25.14 kg/m.  Diet Order            Diet regular Room service appropriate? Yes; Fluid consistency: Thin  Diet effective now                       Medications:   . apixaban  10 mg Oral BID   Followed by  . [START ON 11/30/2019] apixaban  5 mg Oral BID  . Chlorhexidine Gluconate Cloth  6 each Topical Daily  . folic acid  1 mg Oral Daily  . gabapentin  300 mg Oral QHS  . melatonin  5 mg Oral QHS  . multivitamin with minerals  1 tablet Oral Daily  . pantoprazole (PROTONIX) IV  40 mg Intravenous Daily  . rosuvastatin  20 mg Oral Daily  . sertraline  25 mg Oral Daily  . tamsulosin  0.4 mg Oral Daily  . thiamine  100 mg Oral Daily   Continuous Infusions: . sodium chloride    . sodium chloride Stopped (11/22/19 1104)  . alteplase (LIMB ISCHEMIA) 10 mg in normal saline (0.02 mg/mL) infusion Stopped (11/22/19 1106)  . alteplase (LIMB ISCHEMIA) 10 mg in normal saline (0.02 mg/mL) infusion Stopped (11/22/19 1058)     Anti-infectives (From  admission, onward)   Start     Dose/Rate Route Frequency Ordered Stop   11/22/19 1100  ceFAZolin (ANCEF) IVPB 2g/100 mL premix        2 g 200 mL/hr over 30 Minutes Intravenous  Once 11/22/19 1046 11/21/19 2227   11/21/19 2245  ceFAZolin (ANCEF) IVPB 2g/100 mL premix  Status:  Discontinued        2 g 200 mL/hr over 30 Minutes Intravenous  Once 11/21/19 2234 11/22/19 0137             Family Communication/Anticipated D/C date and plan/Code Status   DVT prophylaxis: On IV heparin infusion     Code Status: Full Code  Family Communication: Plan discussed with the patient Disposition Plan:    Status is: Inpatient  Remains inpatient appropriate because:Inpatient level of care appropriate due to severity of illness   Dispo: The patient is from: Home              Anticipated d/c is to: Home              Anticipated d/c date is: 1 day              Patient currently is not medically stable to d/c.  Subjective:   Overnight events noted.  No pain in the right foot.  No shortness of breath or chest pain.  Objective:    Vitals:   11/23/19 0900 11/23/19 1000 11/23/19 1100 11/23/19 1200  BP: 118/69 (!) 102/46 123/61 125/63  Pulse: 69 66 66 67  Resp: 17 15 16 17   Temp:    97.8 F (36.6 C)  TempSrc:    Oral  SpO2: 94% 92% 96% 97%  Weight:      Height:       No data found.   Intake/Output Summary (Last 24 hours) at 11/23/2019 1317 Last data filed at 11/23/2019 1200 Gross per 24 hour  Intake 267.76 ml  Output 1000 ml  Net -732.24 ml   Filed Weights   11/21/19 2053 11/22/19 0150  Weight: 72.6 kg 75 kg    Exam:  GEN: NAD SKIN: No bleeding on the left groin.  Left femoral central line in place. EYES: No pallor or icterus ENT: MMM CV: RRR PULM: No wheezing or rales heard  ABD: soft, ND, NT, +BS CNS: AAO x 3, non focal EXT: No edema or tenderness.  Bilateral feet are warm to touch.   Data Reviewed:   I have personally reviewed following  labs and imaging studies:  Labs: Labs show the following:   Basic Metabolic Panel: Recent Labs  Lab 11/21/19 2101 11/21/19 2101 11/22/19 0156 11/22/19 0156 11/22/19 0320 11/23/19 0446  NA 135  --  137  --  136 140  K 3.9   < > 3.7   < > 3.0* 3.8  CL 97*  --  102  --  104 106  CO2 25  --  23  --  21* 27  GLUCOSE 150*  --  137*  --  150* 106*  BUN 18  --  17  --  15 13  CREATININE 1.13  --  1.10  --  1.03 1.05  CALCIUM 9.7  --  8.3*  --  7.8* 8.6*  MG  --   --  1.8  --   --  2.1  PHOS  --   --  3.0  --   --  3.0   < > = values in this interval not displayed.   GFR Estimated Creatinine Clearance: 69.7 mL/min (by C-G formula based on SCr of 1.05 mg/dL). Liver Function Tests: Recent Labs  Lab 11/22/19 0156  AST 49*  ALT 27  ALKPHOS 63  BILITOT 1.3*  PROT 6.4*  ALBUMIN 4.1   No results for input(s): LIPASE, AMYLASE in the last 168 hours. No results for input(s): AMMONIA in the last 168 hours. Coagulation profile Recent Labs  Lab 11/21/19 2101 11/22/19 0320  INR 1.0 1.3*    CBC: Recent Labs  Lab 11/21/19 2101 11/22/19 0156 11/22/19 0320 11/23/19 0446  WBC 12.1* 13.2* 11.6* 7.5  NEUTROABS 10.2*  --   --   --   HGB 13.8 12.3* 12.3* 11.1*  HCT 40.1 35.7* 36.0* 33.4*  MCV 91.1 90.8 91.4 93.6  PLT 180 160 141* 123*   Cardiac Enzymes: No results for input(s): CKTOTAL, CKMB, CKMBINDEX, TROPONINI in the last 168 hours. BNP (last 3 results) No results for input(s): PROBNP in the last 8760 hours. CBG: Recent Labs  Lab 11/22/19 0149  GLUCAP 130*   D-Dimer: No results for input(s): DDIMER in the last 72 hours. Hgb A1c: No results for input(s): HGBA1C in the last 72 hours. Lipid Profile: No results for input(s): CHOL, HDL, LDLCALC,  TRIG, CHOLHDL, LDLDIRECT in the last 72 hours. Thyroid function studies: No results for input(s): TSH, T4TOTAL, T3FREE, THYROIDAB in the last 72 hours.  Invalid input(s): FREET3 Anemia work up: No results for input(s):  VITAMINB12, FOLATE, FERRITIN, TIBC, IRON, RETICCTPCT in the last 72 hours. Sepsis Labs: Recent Labs  Lab 11/21/19 2101 11/22/19 0156 11/22/19 0320 11/23/19 0446  WBC 12.1* 13.2* 11.6* 7.5    Microbiology Recent Results (from the past 240 hour(s))  MRSA PCR Screening     Status: None   Collection Time: 11/21/19  8:03 PM   Specimen: Nasal Swab (BD Veritor Kit); Nasopharyngeal  Result Value Ref Range Status   MRSA by PCR NEGATIVE NEGATIVE Final    Comment:        The GeneXpert MRSA Assay (FDA approved for NASAL specimens only), is one component of a comprehensive MRSA colonization surveillance program. It is not intended to diagnose MRSA infection nor to guide or monitor treatment for MRSA infections. Performed at [facility], [address] [place], [place] [address]   SARS Coronavirus 2 by RT PCR (hospital order, performed in [facility] hospital lab) Nasopharyngeal Nasopharyngeal Swab     Status: None   Collection Time: 11/21/19  9:30 PM   Specimen: Nasopharyngeal Swab  Result Value Ref Range Status   SARS Coronavirus 2 NEGATIVE NEGATIVE Final    Comment: (NOTE) SARS-CoV-2 target nucleic acids are NOT DETECTED.  The SARS-CoV-2 RNA is generally detectable in upper and lower respiratory specimens during the acute phase of infection. The lowest concentration of SARS-CoV-2 viral copies this assay can detect is 250 copies / mL. A negative result does not preclude SARS-CoV-2 infection and should not be used as the sole basis for treatment or other patient management decisions.  A negative result may occur with improper specimen collection / handling, submission of specimen other than nasopharyngeal swab, presence of viral mutation(s) within the areas targeted by this assay, and inadequate number of viral copies (<250 copies / mL). A negative result must be combined with clinical observations, patient history, and epidemiological information.  Fact Sheet for  Patients:   StrictlyIdeas.no  Fact Sheet for Healthcare Providers: BankingDealers.co.za  This test is not yet approved or  cleared by the [place] FDA and has been authorized for detection and/or diagnosis of SARS-CoV-2 by FDA under an Emergency Use Authorization (EUA).  This EUA will remain in effect (meaning this test can be used) for the duration of the COVID-19 declaration under Section 564(b)(1) of the Act, 21 U.S.C. section 360bbb-3(b)(1), unless the authorization is terminated or revoked sooner.  Performed at [facility], [address] [place], [place] [address]     Procedures and diagnostic studies:  No results found.             LOS: 1 day   Zeppelin Beckstrand  [facility]     11/23/2019, 1:17 PM

## 2019-11-23 NOTE — Progress Notes (Addendum)
ANTICOAGULATION CONSULT NOTE  Pharmacy Consult for Heparin Indication: limb ischemia  Patient Measurements: Height: 5\' 8"  (172.7 cm) Weight: 75 kg (165 lb 5.5 oz) IBW/kg (Calculated) : 68.4  Vital Signs: Temp: 98.3 F (36.8 C) (07/11 0400) Temp Source: Oral (07/11 0400) BP: 109/61 (07/11 0500) Pulse Rate: 63 (07/11 0500)  Labs: Recent Labs    11/21/19 2101 11/21/19 2101 11/22/19 0156 11/22/19 0156 11/22/19 0320 11/22/19 0320 11/22/19 1318 11/22/19 2028 11/23/19 0446  HGB 13.8   < > 12.3*   < > 12.3*  --   --   --  11.1*  HCT 40.1   < > 35.7*  --  36.0*  --   --   --  33.4*  PLT 180   < > 160  --  141*  --   --   --  123*  APTT  --   --   --   --  >160*   < > 44* 31 49*  LABPROT 12.7  --   --   --  15.3*  --   --   --   --   INR 1.0  --   --   --  1.3*  --   --   --   --   HEPARINUNFRC  --   --   --   --   --   --  <0.10*  --  0.17*  CREATININE 1.13   < > 1.10  --  1.03  --   --   --  1.05   < > = values in this interval not displayed.    Estimated Creatinine Clearance: 69.7 mL/min (by C-G formula based on SCr of 1.05 mg/dL).   Medical History: Past Medical History:  Diagnosis Date  . Collagen vascular disease (HCC)   . Hyperlipidemia   . Hypertension   . Peripheral vascular disease (HCC)     Medications:  Medications Prior to Admission  Medication Sig Dispense Refill Last Dose  . amLODipine-benazepril (LOTREL) 5-20 MG capsule Take 1 capsule by mouth 2 (two) times daily.   5   . apixaban (ELIQUIS) 5 MG TABS tablet Take 1 tablet (5 mg total) by mouth 2 (two) times daily. 60 tablet 5   . aspirin EC 81 MG EC tablet Take 1 tablet (81 mg total) by mouth daily. (Patient not taking: Reported on 10/21/2019)   Not Taking at Unknown time  . ciclopirox (PENLAC) 8 % solution APPLY OVER NAIL AND SURROUNDING SKIN DAILY AT BEDTIME OVER PREVIOUS COAT. AFTER 7 DAYS, MAY REMOVE WITH ALCOHOL AND CONTINUE CYCLE 6.6 mL 0   . furosemide (LASIX) 20 MG tablet TK 1 T PO QD PRN.  2   .  gabapentin (NEURONTIN) 300 MG capsule TAKE 1 CAPSULE BY MOUTH AT BEDTIME 60 capsule 0   . Multiple Vitamin (MULTIVITAMIN WITH MINERALS) TABS tablet Take 1 tablet by mouth every evening.     . Omega-3 Fatty Acids (FISH OIL) 1200 MG CAPS Take 2,400 mg by mouth 2 (two) times daily.     12/21/2019 oxyCODONE (OXY IR/ROXICODONE) 5 MG immediate release tablet Take 1 tablet (5 mg total) by mouth every 4 (four) hours as needed for moderate pain. (Patient not taking: Reported on 10/21/2019) 30 tablet 0 Not Taking at Unknown time  . rosuvastatin (CRESTOR) 20 MG tablet Take 20 mg by mouth daily.     . sertraline (ZOLOFT) 100 MG tablet TK 1 T PO QD  3   . Specialty Vitamins  Products (ONE-A-DAY PROSTATE PO) Take 1 tablet by mouth daily. "Super Beta Prostate"     . tamsulosin (FLOMAX) 0.4 MG CAPS capsule Take 0.4 mg by mouth daily.     [name] Zn-Pyg Afri-Nettle-Saw Palmet (SAW PALMETTO COMPLEX PO) Take 2 tablets by mouth daily.       Assessment: 64 y.o. male with a known history of collagen vascular disease, hypertension, hyperlipidemia, BPH,  previous limb ischemia on apixaban presents to the emergency department for evaluation of sudden onset of right lower extremity pain. Heparin initiated in cath lab at 500 units/hr. Previous supratherapeutic aPTT though to be an error based on TPA co-infusion (re-enforced by heparin infusion data on previous admissions requiring 750 units/hr). He is s/p RLE angiogram and angioplasty today. Additionally this afternoon's aPTT and anti-Xa levels were drawn after heparin was stopped resulting in conflicting and incomplete information. H&H, platelets are noted to be trending down.  7/10 2028 APTT 33 sec. Bolus of 2000 units x1, increase rate to 700 units/hr 7/11 0446 aPTT 49 sec. Subtherapeutic.  CBC slightly worse.  Bolus of 2000 units x 1, increase rate to 900 units/hr  Goal of Therapy:  aPTT 66-102 seconds Heparin level 0.3 - 0.7 IU.mL Monitor platelets by anticoagulation protocol: Yes    Plan:   Subtherapeutic. Per surgeon, ok to receive bolus infusion regarding heparin. Bolus 2000 units x 1, increasing rate to 900 units/hr  check aPTT and anti-Xa level with AM labs  aPTT will be used to guide therapy (given recent apixaban administration PTA) until anti-Xa and aPTT correlate  CBC daily  Monitor for s/sx bleeding complications  Valrie Hart A 11/23/2019,6:17 AM

## 2019-11-24 ENCOUNTER — Encounter: Payer: Self-pay | Admitting: Vascular Surgery

## 2019-11-24 LAB — BASIC METABOLIC PANEL
Anion gap: 6 (ref 5–15)
BUN: 15 mg/dL (ref 8–23)
CO2: 29 mmol/L (ref 22–32)
Calcium: 8.6 mg/dL — ABNORMAL LOW (ref 8.9–10.3)
Chloride: 105 mmol/L (ref 98–111)
Creatinine, Ser: 1.1 mg/dL (ref 0.61–1.24)
GFR calc Af Amer: >60 mL/min (ref >60)
GFR calc non Af Amer: >60 mL/min (ref >60)
Glucose, Bld: 113 mg/dL — ABNORMAL HIGH (ref 70–99)
Potassium: 4.2 mmol/L (ref 3.5–5.1)
Sodium: 140 mmol/L (ref 135–145)

## 2019-11-24 LAB — PHOSPHORUS: Phosphorus: 3.6 mg/dL (ref 2.5–4.6)

## 2019-11-24 LAB — CBC
HCT: 29.8 % — ABNORMAL LOW (ref 39.0–52.0)
Hemoglobin: 10.3 g/dL — ABNORMAL LOW (ref 13.0–17.0)
MCH: 32.1 pg (ref 26.0–34.0)
MCHC: 34.6 g/dL (ref 30.0–36.0)
MCV: 92.8 fL (ref 80.0–100.0)
Platelets: 128 10*3/uL — ABNORMAL LOW (ref 150–400)
RBC: 3.21 MIL/uL — ABNORMAL LOW (ref 4.22–5.81)
RDW: 13.9 % (ref 11.5–15.5)
WBC: 7.2 10*3/uL (ref 4.0–10.5)
nRBC: 0.3 % — ABNORMAL HIGH (ref 0.0–0.2)

## 2019-11-24 LAB — MAGNESIUM: Magnesium: 2 mg/dL (ref 1.7–2.4)

## 2019-11-24 NOTE — Discharge Summary (Signed)
Physician Discharge Summary  Travis Salas ACZ:[id number] DOB: 08-11-1955 DOA: 11/21/2019  PCP: Travis Billet, MD  Admit date: 11/21/2019 Discharge date: 11/24/2019  Discharge disposition: Home   Recommendations for Outpatient Follow-Up:   Follow-up with PCP in 1 week Follow-up with vascular surgeon in 1 month   Discharge Diagnosis:   Active Problems:   Lower limb ischemia    Discharge Condition: Stable.  Diet recommendation:  Diet Order            Diet - low sodium heart healthy                       Code Status: Full Code     Hospital Course:   [name] is a 64 year old man with medical history significant for collagen vascular disease, hypertension, hyperlipidemia, BPH, alcohol use disorder, marijuana use, PVD with previous limb ischemia s/p right femoral endarterectomy and femoral to peroneal bypass.  He presented to the hospital because of sudden onset of right leg pain associated with prolonged and cold sensation.  He said he was on Eliquis and had been instructed to hold Eliquis because of upcoming prostate biopsy.  He was admitted to the hospital for acute critical limb ischemia of the right lower extremity.  He was treated with IV heparin infusion and analgesics.  He was seen in consultation with the vascular surgeon.  He underwent mechanical thrombectomy and thrombolytic therapy through the right femoral artery into the tibioperoneal trunk on 11/21/2019 and also underwent percutaneous transluminal angioplasty of the right profunda femoral artery right external iliac artery on 11/22/2019.  His condition has remained stable postoperatively.  He is deemed stable for discharge to home.  Case was discussed with Dr. Lucky Cowboy, vascular surgeon on-call and from his standpoint patient is okay for discharge.  He had hematuria on admission but this has resolved.  Of note, his blood pressure was on the low side so Lotrel was discontinued at discharge.  However, he was  advised to follow-up with PCP for further recommendations on antihypertensives depending on his blood pressure.     Discharge Exam:   Vitals:   11/24/19 0435 11/24/19 1036  BP: (!) 105/58 (!) 143/75  Pulse: 60 73  Resp: 20 19  Temp: 98.4 F (36.9 C) 98.2 F (36.8 C)  SpO2: 95% 99%   Vitals:   11/23/19 1320 11/23/19 2024 11/24/19 0435 11/24/19 1036  BP: 124/63 (!) 141/70 (!) 105/58 (!) 143/75  Pulse: 72 77 60 73  Resp: 18 20 20 19   Temp: 98.3 F (36.8 C) 98.6 F (37 C) 98.4 F (36.9 C) 98.2 F (36.8 C)  TempSrc: Oral Oral Oral Oral  SpO2: 96% 98% 95% 99%  Weight:      Height:         GEN: NAD SKIN: No rash EYES: EOMI ENT: MMM CV: RRR PULM: CTA B ABD: soft, ND, NT, +BS CNS: AAO x 3, non focal EXT: No edema or tenderness   The results of significant diagnostics from this hospitalization (including imaging, microbiology, ancillary and laboratory) are listed below for reference.     Procedures and Diagnostic Studies:   No results found.   Labs:   Basic Metabolic Panel: Recent Labs  Lab 11/21/19 2101 11/21/19 2101 11/22/19 0156 11/22/19 0156 11/22/19 0320 11/22/19 0320 11/23/19 0446 11/24/19 0544  NA 135  --  137  --  136  --  140 140  K 3.9   < > 3.7   < >  3.0*   < > 3.8 4.2  CL 97*  --  102  --  104  --  106 105  CO2 25  --  23  --  21*  --  27 29  GLUCOSE 150*  --  137*  --  150*  --  106* 113*  BUN 18  --  17  --  15  --  13 15  CREATININE 1.13  --  1.10  --  1.03  --  1.05 1.10  CALCIUM 9.7  --  8.3*  --  7.8*  --  8.6* 8.6*  MG  --   --  1.8  --   --   --  2.1 2.0  PHOS  --   --  3.0  --   --   --  3.0 3.6   < > = values in this interval not displayed.   GFR Estimated Creatinine Clearance: 66.5 mL/min (by C-G formula based on SCr of 1.1 mg/dL). Liver Function Tests: Recent Labs  Lab 11/22/19 0156  AST 49*  ALT 27  ALKPHOS 63  BILITOT 1.3*  PROT 6.4*  ALBUMIN 4.1   No results for input(s): LIPASE, AMYLASE in the last 168  hours. No results for input(s): AMMONIA in the last 168 hours. Coagulation profile Recent Labs  Lab 11/21/19 2101 11/22/19 0320  INR 1.0 1.3*    CBC: Recent Labs  Lab 11/21/19 2101 11/22/19 0156 11/22/19 0320 11/23/19 0446 11/24/19 0544  WBC 12.1* 13.2* 11.6* 7.5 7.2  NEUTROABS 10.2*  --   --   --   --   HGB 13.8 12.3* 12.3* 11.1* 10.3*  HCT 40.1 35.7* 36.0* 33.4* 29.8*  MCV 91.1 90.8 91.4 93.6 92.8  PLT 180 160 141* 123* 128*   Cardiac Enzymes: No results for input(s): CKTOTAL, CKMB, CKMBINDEX, TROPONINI in the last 168 hours. BNP: Invalid input(s): POCBNP CBG: Recent Labs  Lab 11/22/19 0149  GLUCAP 130*   D-Dimer No results for input(s): DDIMER in the last 72 hours. Hgb A1c No results for input(s): HGBA1C in the last 72 hours. Lipid Profile No results for input(s): CHOL, HDL, LDLCALC, TRIG, CHOLHDL, LDLDIRECT in the last 72 hours. Thyroid function studies No results for input(s): TSH, T4TOTAL, T3FREE, THYROIDAB in the last 72 hours.  Invalid input(s): FREET3 Anemia work up No results for input(s): VITAMINB12, FOLATE, FERRITIN, TIBC, IRON, RETICCTPCT in the last 72 hours. Microbiology Recent Results (from the past 240 hour(s))  MRSA PCR Screening     Status: None   Collection Time: 11/21/19  8:03 PM   Specimen: Nasal Swab (BD Veritor Kit); Nasopharyngeal  Result Value Ref Range Status   MRSA by PCR NEGATIVE NEGATIVE Final    Comment:        The GeneXpert MRSA Assay (FDA approved for NASAL specimens only), is one component of a comprehensive MRSA colonization surveillance program. It is not intended to diagnose MRSA infection nor to guide or monitor treatment for MRSA infections. Performed at [facility], [address] [place], [place] [address]   SARS Coronavirus 2 by RT PCR (hospital order, performed in [facility] hospital lab) Nasopharyngeal Nasopharyngeal Swab     Status: None   Collection Time: 11/21/19  9:30 PM   Specimen:  Nasopharyngeal Swab  Result Value Ref Range Status   SARS Coronavirus 2 NEGATIVE NEGATIVE Final    Comment: (NOTE) SARS-CoV-2 target nucleic acids are NOT DETECTED.  The SARS-CoV-2 RNA is generally detectable in upper and lower  respiratory specimens during the acute phase of infection. The lowest concentration of SARS-CoV-2 viral copies this assay can detect is 250 copies / mL. A negative result does not preclude SARS-CoV-2 infection and should not be used as the sole basis for treatment or other patient management decisions.  A negative result may occur with improper specimen collection / handling, submission of specimen other than nasopharyngeal swab, presence of viral mutation(s) within the areas targeted by this assay, and inadequate number of viral copies (<250 copies / mL). A negative result must be combined with clinical observations, patient history, and epidemiological information.  Fact Sheet for Patients:   StrictlyIdeas.no  Fact Sheet for Healthcare Providers: BankingDealers.co.za  This test is not yet approved or  cleared by the [place] FDA and has been authorized for detection and/or diagnosis of SARS-CoV-2 by FDA under an Emergency Use Authorization (EUA).  This EUA will remain in effect (meaning this test can be used) for the duration of the COVID-19 declaration under Section 564(b)(1) of the Act, 21 U.S.C. section 360bbb-3(b)(1), unless the authorization is terminated or revoked sooner.  Performed at [facility], [address] [place], [place] [address]      Discharge Instructions:   Discharge Instructions    Diet - low sodium heart healthy   Complete by: As directed    Discharge instructions   Complete by: As directed    Avoid alcohol   Increase activity slowly   Complete by: As directed      Allergies as of 11/24/2019      Reactions   Hydrocodone Swelling      Medication List      STOP taking these medications   amLODipine-benazepril 5-20 MG capsule Commonly known as: LOTREL   aspirin 81 MG EC tablet   oxyCODONE 5 MG immediate release tablet Commonly known as: Oxy IR/ROXICODONE     TAKE these medications   apixaban 5 MG Tabs tablet Commonly known as: ELIQUIS Take 1 tablet (5 mg total) by mouth 2 (two) times daily.   ciclopirox 8 % solution Commonly known as: PENLAC APPLY OVER NAIL AND SURROUNDING SKIN DAILY AT BEDTIME OVER PREVIOUS COAT. AFTER 7 DAYS, MAY REMOVE WITH ALCOHOL AND CONTINUE CYCLE   Fish Oil 1200 MG Caps Take 2,400 mg by mouth 2 (two) times daily.   furosemide 20 MG tablet Commonly known as: LASIX TK 1 T PO QD PRN.   gabapentin 300 MG capsule Commonly known as: NEURONTIN TAKE 1 CAPSULE BY MOUTH AT BEDTIME   multivitamin with minerals Tabs tablet Take 1 tablet by mouth every evening.   ONE-A-DAY PROSTATE PO Take 1 tablet by mouth daily. "Super Beta Prostate"   rosuvastatin 20 MG tablet Commonly known as: CRESTOR Take 20 mg by mouth daily.   SAW PALMETTO COMPLEX PO Take 2 tablets by mouth daily.   sertraline 100 MG tablet Commonly known as: ZOLOFT TK 1 T PO QD   tamsulosin 0.4 MG Caps capsule Commonly known as: FLOMAX Take 0.4 mg by mouth daily.       Follow-up Information    Algernon Huxley, MD On 12/29/2019.   Specialties: Vascular Surgery, Radiology, Interventional Cardiology Why: 9:30 Contact information: [address] [place] [address] [phone]                Time coordinating discharge: 28 minutes  Signed:  Jennye Boroughs  [facility] 11/24/2019, 3:42 PM

## 2019-11-24 NOTE — TOC Initial Note (Signed)
Transition of Care [facility]) - Initial/Assessment Note    Patient Details  Name: Travis Salas MRN: [id number] Date of Birth: Feb 06, 1956  Transition of [facility]) CM/SW Contact:    Beverly Sessions, RN Phone Number: 11/24/2019, 10:52 AM  Clinical Narrative:                 TOC  Consult for substance abuse.  Patient confirms alcohol use and Marijuana use  RNCM met at bedside. Offered/reviewed substance abuse resources.  Patient states "everyone always wants to know if I am going through withdrawal"  RNCM explained benefits of resources.  Patient state "no i'm not interested, if I want to stop i'll stop on my own".  MD and bedside RN notified   Expected Discharge Plan: Home/Self Care Barriers to Discharge: No Barriers Identified   Patient Goals and CMS Choice        Expected Discharge Plan and Services Expected Discharge Plan: Home/Self Care         Expected Discharge Date: 11/24/19                                    Prior Living Arrangements/Services     Patient language and need for interpreter reviewed:: No Do you feel safe going back to the place where you live?: Yes      Need for Family Participation in Patient Care: No (Comment) Care giver support system in place?: No (comment)   Criminal Activity/Legal Involvement Pertinent to Current Situation/Hospitalization: No - Comment as needed  Activities of Daily Living Home Assistive Devices/Equipment: None ADL Screening (condition at time of admission) Patient's cognitive ability adequate to safely complete daily activities?: Yes Is the patient deaf or have difficulty hearing?: No Does the patient have difficulty seeing, even when wearing glasses/contacts?: No Does the patient have difficulty concentrating, remembering, or making decisions?: No Patient able to express need for assistance with ADLs?: Yes Does the patient have difficulty dressing or bathing?: No Independently performs ADLs?: Yes  (appropriate for developmental age) Does the patient have difficulty walking or climbing stairs?: No Weakness of Legs: None Weakness of Arms/Hands: None  Permission Sought/Granted                  Emotional Assessment Appearance:: Appears stated age     Orientation: : Oriented to Self, Oriented to Place, Oriented to  Time, Oriented to Situation Alcohol / Substance Use: Tobacco Use, Alcohol Use, Illicit Drugs Psych Involvement: No (comment)  Admission diagnosis:  Ischemic leg [I99.8] Lower limb ischemia [I99.8] Patient Active Problem List   Diagnosis Date Noted  . Lower limb ischemia 11/22/2019  . Limb ischemia 06/19/2019  . Hypertension 12/20/2016  . Hyperlipidemia 06/13/2016  . Swelling of limb 06/13/2016  . Atherosclerosis of native arteries of extremity with intermittent claudication ([place]) 06/13/2016  . Ischemic leg 12/13/2015   PCP:  Albina Billet, MD Pharmacy:   [facility] [phone], [place] [address] [place] [address] Phone: [phone] Fax: [phone]     Social Determinants of Health (SDOH) Interventions    Readmission Risk Interventions No flowsheet data found.

## 2019-11-24 NOTE — Plan of Care (Signed)
The patient has been discharged. IV has been removed. No falls. Education performed on caring for incision site. Problem: Education: Goal: Knowledge of General Education information will improve Description: Including pain rating scale, medication(s)/side effects and non-pharmacologic comfort measures Outcome: Completed/Met   Problem: Health Behavior/Discharge Planning: Goal: Ability to manage health-related needs will improve Outcome: Completed/Met   Problem: Clinical Measurements: Goal: Ability to maintain clinical measurements within normal limits will improve Outcome: Completed/Met Goal: Will remain free from infection Outcome: Completed/Met Goal: Diagnostic test results will improve Outcome: Completed/Met Goal: Respiratory complications will improve Outcome: Completed/Met Goal: Cardiovascular complication will be avoided Outcome: Completed/Met   Problem: Activity: Goal: Risk for activity intolerance will decrease Outcome: Completed/Met   Problem: Nutrition: Goal: Adequate nutrition will be maintained Outcome: Completed/Met   Problem: Coping: Goal: Level of anxiety will decrease Outcome: Completed/Met   Problem: Elimination: Goal: Will not experience complications related to bowel motility Outcome: Completed/Met Goal: Will not experience complications related to urinary retention Outcome: Completed/Met   Problem: Pain Managment: Goal: General experience of comfort will improve Outcome: Completed/Met   Problem: Safety: Goal: Ability to remain free from injury will improve Outcome: Completed/Met   Problem: Skin Integrity: Goal: Risk for impaired skin integrity will decrease Outcome: Completed/Met

## 2019-12-05 DIAGNOSIS — R972 Elevated prostate specific antigen [PSA]: Secondary | ICD-10-CM | POA: Diagnosis not present

## 2019-12-05 DIAGNOSIS — N400 Enlarged prostate without lower urinary tract symptoms: Secondary | ICD-10-CM | POA: Diagnosis not present

## 2019-12-05 DIAGNOSIS — E785 Hyperlipidemia, unspecified: Secondary | ICD-10-CM | POA: Diagnosis not present

## 2019-12-05 DIAGNOSIS — I10 Essential (primary) hypertension: Secondary | ICD-10-CM | POA: Diagnosis not present

## 2019-12-09 DIAGNOSIS — E785 Hyperlipidemia, unspecified: Secondary | ICD-10-CM | POA: Diagnosis not present

## 2019-12-09 DIAGNOSIS — I739 Peripheral vascular disease, unspecified: Secondary | ICD-10-CM | POA: Diagnosis not present

## 2019-12-09 DIAGNOSIS — I10 Essential (primary) hypertension: Secondary | ICD-10-CM | POA: Diagnosis not present

## 2019-12-21 ENCOUNTER — Other Ambulatory Visit: Payer: Self-pay | Admitting: Surgery

## 2019-12-29 ENCOUNTER — Ambulatory Visit (INDEPENDENT_AMBULATORY_CARE_PROVIDER_SITE_OTHER): Payer: BC Managed Care – PPO

## 2019-12-29 ENCOUNTER — Other Ambulatory Visit: Payer: Self-pay

## 2019-12-29 ENCOUNTER — Encounter (INDEPENDENT_AMBULATORY_CARE_PROVIDER_SITE_OTHER): Payer: Self-pay | Admitting: Nurse Practitioner

## 2019-12-29 ENCOUNTER — Ambulatory Visit (INDEPENDENT_AMBULATORY_CARE_PROVIDER_SITE_OTHER): Payer: BC Managed Care – PPO | Admitting: Nurse Practitioner

## 2019-12-29 VITALS — BP 125/7 | HR 76 | Resp 17 | Ht 67.0 in | Wt 161.0 lb

## 2019-12-29 DIAGNOSIS — I70213 Atherosclerosis of native arteries of extremities with intermittent claudication, bilateral legs: Secondary | ICD-10-CM

## 2019-12-29 DIAGNOSIS — E785 Hyperlipidemia, unspecified: Secondary | ICD-10-CM | POA: Diagnosis not present

## 2019-12-29 DIAGNOSIS — I10 Essential (primary) hypertension: Secondary | ICD-10-CM | POA: Diagnosis not present

## 2019-12-29 DIAGNOSIS — I998 Other disorder of circulatory system: Secondary | ICD-10-CM

## 2019-12-29 NOTE — Progress Notes (Signed)
Subjective:    Patient ID: Travis Salas, male    DOB: 10/28/1955, 64 y.o.   MRN: [id number] Chief Complaint  Patient presents with  . Follow-up    ultrasound post thrombectomy    The patient returns to the office for followup and review status post angiogram with intervention, including:  Procedure(s) Performed: 1.Right lower extremity angiogram 2.Catheter placement into right femoral to distal bypass and right profunda femoris artery from left femoral approach 3.Percutaneous transluminal angioplasty of the right profunda femoris artery using a 5x40  mm DCB followed by 6x20 plain balloon             4. Percutaneous transluminal angioplasty of right External iliac artery with 6x56mm balloon for proximal edge stenosis of EIA stent 5.StarClose closure device leftfemoral artery  The patient notes improvement in the lower extremity symptoms. No interval shortening of the patient's claudication distance or rest pain symptoms. Previous wounds have now healed.  No new ulcers or wounds have occurred since the last visit.  There have been no significant changes to the patient's overall health care.  The patient denies amaurosis fugax or recent TIA symptoms. There are no recent neurological changes noted. The patient denies history of DVT, PE or superficial thrombophlebitis. The patient denies recent episodes of angina or shortness of breath.   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  Right lower extremity has biphasic waveforms with good digit waveforms.   Review of Systems  Cardiovascular:       Claudication  Neurological: Positive for numbness.       Objective:   Physical Exam Vitals reviewed.  HENT:      Head: Normocephalic.  Cardiovascular:     Rate and Rhythm: Normal rate and regular rhythm.     Pulses: Decreased pulses.  Pulmonary:     Effort: Pulmonary effort is normal.  Skin:    General: Skin is warm and dry.  Neurological:     General: No focal deficit present.     Mental Status: He is alert and oriented to person, place, and time.  Psychiatric:        Mood and Affect: Mood normal.        Behavior: Behavior normal.        Thought Content: Thought content normal.        Judgment: Judgment normal.     BP (!) 125/7 (BP Location: Right Arm)   Pulse 76   Resp 17   Ht 5\' 7"  (1.702 m)   Wt 161 lb (73 kg)   BMI 25.22 kg/m   Past Medical History:  Diagnosis Date  . Collagen vascular disease (HCC)   . Hyperlipidemia   . Hypertension   . Peripheral vascular disease (HCC)     Social History   Socioeconomic History  . Marital status: Married    Spouse name:  . Number of children: Not on file  . Years of education: Not on file  . Highest education level: Not on file  Occupational History  . Not on file  Tobacco Use  . Smoking status: Former Smoker    Packs/day: 1.50    Years: 46.00    Pack years: 69.00    Types: Cigarettes    Quit date: 02/05/2015    Years since quitting: 4.8  .  Smokeless tobacco: Never Used  Vaping Use  . Vaping Use: Never used  Substance and Sexual Activity  . Alcohol use: Yes    Alcohol/week: 7.0 standard drinks    Types: 7 Cans of beer per week  . Drug use: Yes    Types: Marijuana    Comment: occasional  . Sexual activity: Not on file  Other Topics Concern  . Not on file  Social History Narrative   Lives at home with wife in private residence   Social Determinants of Health   Financial Resource Strain:   . Difficulty of Paying Living Expenses:   Food Insecurity:   . Worried About Programme researcher, broadcasting/film/video in the Last Year:   . Barista in the Last Year:   Transportation Needs:   . Freight forwarder (Medical):   [name] Lack  of Transportation (Non-Medical):   Physical Activity:   . Days of Exercise per Week:   . Minutes of Exercise per Session:   Stress:   . Feeling of Stress :   Social Connections:   . Frequency of Communication with Friends and Family:   . Frequency of Social Gatherings with Friends and Family:   . Attends Religious Services:   . Active Member of Clubs or Organizations:   . Attends Banker Meetings:   [name] Marital Status:   Intimate Partner Violence:   . Fear of Current or Ex-Partner:   . Emotionally Abused:   [name] Physically Abused:   . Sexually Abused:     Past Surgical History:  Procedure Laterality Date  . BYPASS GRAFT ANGIOGRAPHY Right 11/21/2019   Procedure: BYPASS GRAFT ANGIOGRAPHY;  Surgeon: Griffith Citron, MD;  Location: [facility];  Service: Cardiovascular;  Laterality: Right;  . ENDARTERECTOMY FEMORAL Right 12/23/2015   Procedure: COMMON, FEMORAL, SUPERFICIAL, AND PROFUNDA ENDARTERECTOMY;  Surgeon: Annice Needy, MD;  Location: [facility];  Service: Vascular;  Laterality: Right;  . FEMORAL-TIBIAL BYPASS GRAFT Right 12/23/2015   Procedure: BYPASS GRAFT FEMORAL-TIBIAL ARTERY, COMMON FEMORAL AND PROFUNDIS FEMORAL WITH PATCH ANGIOPLASTY, POPLITEAL ANTERIOR ARTERY BYPASS, ILIOFEMORAL ANGIOGRAM VIABHAM STENT 8 MM DIAMETER X 5 CM LENGTH;  Surgeon: Annice Needy, MD;  Location: [facility];  Service: Vascular;  Laterality: Right;  . LOWER EXTREMITY ANGIOGRAPHY Right 06/19/2019   Procedure: LOWER EXTREMITY ANGIOGRAPHY;  Surgeon: Annice Needy, MD;  Location: [facility];  Service: Cardiovascular;  Laterality: Right;  . LOWER EXTREMITY ANGIOGRAPHY Right 06/20/2019   Procedure: Lower Extremity Angiography;  Surgeon: Annice Needy, MD;  Location: [facility];  Service: Cardiovascular;  Laterality: Right;  . PERIPHERAL VASCULAR CATHETERIZATION Right 11/08/2015   Procedure: Lower Extremity Angiography;  Surgeon: Annice Needy, MD;  Location: [facility];  Service:  Cardiovascular;  Laterality: Right;  . PERIPHERAL VASCULAR CATHETERIZATION Right 12/13/2015   Procedure: Lower Extremity Angiography;  Surgeon: Annice Needy, MD;  Location: [facility];  Service: Cardiovascular;  Laterality: Right;  . PERIPHERAL VASCULAR THROMBECTOMY Right 11/21/2019   Procedure: PERIPHERAL VASCULAR THROMBECTOMY;  Surgeon: Griffith Citron, MD;  Location: [facility];  Service: Cardiovascular;  Laterality: Right;  . PERIPHERAL VASCULAR THROMBECTOMY Right 11/22/2019   Procedure: PERIPHERAL VASCULAR THROMBECTOMY;  Surgeon: Griffith Citron, MD;  Location: [facility];  Service: Cardiovascular;  Laterality: Right;    Family History  Problem Relation Age of Onset  . Diabetes Mother   . Hypertension Brother     Allergies  Allergen Reactions  .  Hydrocodone Swelling       Assessment & Plan:   1. Atherosclerosis of native artery of both lower extremities with intermittent claudication (HCC)  Recommend:  The patient has evidence of atherosclerosis of the lower extremities with claudication.  The patient does not voice lifestyle limiting changes at this point in time.  Noninvasive studies do not suggest clinically significant change.  No invasive studies, angiography or surgery at this time The patient should continue walking and begin a more formal exercise program.  The patient should continue antiplatelet therapy and aggressive treatment of the lipid abnormalities  No changes in the patient's medications at this time  The patient should continue wearing graduated compression socks 10-15 mmHg strength to control the mild edema.    2. Hyperlipidemia, unspecified hyperlipidemia type Continue statin as ordered and reviewed, no changes at this time   3. Essential hypertension Continue antihypertensive medications as already ordered, these medications have been reviewed and there are no changes at this time.   4. Lower limb ischemia I suspect that the  patient's ischemia was caused by stopping his Eliquis for possible prostate biopsy.  Patient will discuss possible alternative methods of evaluation prior to scheduling another biopsy.  If indeed another biopsy is needed the patient may need to have it done inpatient with heparin transition.   Current Outpatient Medications on File Prior to Visit  Medication Sig Dispense Refill  . ciclopirox (PENLAC) 8 % solution APPLY OVER NAIL AND SURROUNDING SKIN DAILY AT BEDTIME OVER PREVIOUS COAT. AFTER 7 DAYS, MAY REMOVE WITH ALCOHOL AND CONTINUE CYCLE 6.6 mL 0  . ELIQUIS 5 MG TABS tablet TAKE 1 TABLET(5 MG) BY MOUTH TWICE DAILY 60 tablet 5  . furosemide (LASIX) 20 MG tablet TK 1 T PO QD PRN.  2  . gabapentin (NEURONTIN) 300 MG capsule TAKE 1 CAPSULE BY MOUTH AT BEDTIME 60 capsule 0  . Omega-3 Fatty Acids (FISH OIL) 1200 MG CAPS Take 2,400 mg by mouth 2 (two) times daily.    . rosuvastatin (CRESTOR) 20 MG tablet Take 20 mg by mouth daily.    . sertraline (ZOLOFT) 100 MG tablet TK 1 T PO QD  3  . tamsulosin (FLOMAX) 0.4 MG CAPS capsule Take 0.4 mg by mouth daily.    . Multiple Vitamin (MULTIVITAMIN WITH MINERALS) TABS tablet Take 1 tablet by mouth every evening. (Patient not taking: Reported on 12/29/2019)    . Specialty Vitamins Products (ONE-A-DAY PROSTATE PO) Take 1 tablet by mouth daily. "Super Beta Prostate" (Patient not taking: Reported on 12/29/2019)    . Zn-Pyg Afri-Nettle-Saw Palmet (SAW PALMETTO COMPLEX PO) Take 2 tablets by mouth daily. (Patient not taking: Reported on 12/29/2019)     No current facility-administered medications on file prior to visit.    There are no Patient Instructions on file for this visit. No follow-ups on file.   Georgiana Spinner, NP

## 2020-01-05 ENCOUNTER — Telehealth (INDEPENDENT_AMBULATORY_CARE_PROVIDER_SITE_OTHER): Payer: Self-pay

## 2020-01-05 NOTE — Telephone Encounter (Signed)
pts wife called and  York Spaniel that the pt started having pain in his right leg yesterday the pain lessened after he took his elliquis  The pt has no swelling , no wounds, no changed in color, no numbness or problems moving his right leg. The pts wife spoke with the doctor on call last at the hospital as well. The pt was seen here in our office by the NP on 8/16  For Atherosclerosis of the native artery of both lower extremities  With intermittent claudication  The pt was recommend to do the following walk, continue statin,and also the patient's ischemia was caused by stopping his Eliquis for possible prostate biopsy.  Patient will discuss possible alternative methods of evaluation prior to scheduling another biopsy.  If indeed another biopsy is needed the patient may need to have it done inpatient with heparin transition. Please advise

## 2020-01-05 NOTE — Telephone Encounter (Signed)
Patient wife confirm that he is taking the Eliquis and has being made aware someone from office will be calling to schedule appointment

## 2020-01-06 ENCOUNTER — Other Ambulatory Visit (INDEPENDENT_AMBULATORY_CARE_PROVIDER_SITE_OTHER): Payer: Self-pay | Admitting: Nurse Practitioner

## 2020-01-06 DIAGNOSIS — M79604 Pain in right leg: Secondary | ICD-10-CM

## 2020-01-07 ENCOUNTER — Inpatient Hospital Stay: Payer: BC Managed Care – PPO

## 2020-01-07 ENCOUNTER — Inpatient Hospital Stay: Payer: BC Managed Care – PPO | Admitting: Anesthesiology

## 2020-01-07 ENCOUNTER — Encounter (INDEPENDENT_AMBULATORY_CARE_PROVIDER_SITE_OTHER): Payer: Self-pay

## 2020-01-07 ENCOUNTER — Other Ambulatory Visit: Payer: Self-pay

## 2020-01-07 ENCOUNTER — Telehealth (INDEPENDENT_AMBULATORY_CARE_PROVIDER_SITE_OTHER): Payer: Self-pay

## 2020-01-07 ENCOUNTER — Ambulatory Visit (INDEPENDENT_AMBULATORY_CARE_PROVIDER_SITE_OTHER): Payer: BC Managed Care – PPO

## 2020-01-07 ENCOUNTER — Encounter
Admission: EM | Disposition: A | Discharge status: Home / Self Care | Payer: Self-pay | Source: Ambulatory Visit | Attending: Vascular Surgery

## 2020-01-07 ENCOUNTER — Encounter (INDEPENDENT_AMBULATORY_CARE_PROVIDER_SITE_OTHER): Payer: Self-pay | Admitting: Nurse Practitioner

## 2020-01-07 ENCOUNTER — Inpatient Hospital Stay
Admission: EM | Admit: 2020-01-07 | Discharge: 2020-01-09 | DRG: 272 | Disposition: A | Discharge status: Home / Self Care | Payer: BC Managed Care – PPO | Attending: Vascular Surgery | Admitting: Vascular Surgery

## 2020-01-07 ENCOUNTER — Other Ambulatory Visit (INDEPENDENT_AMBULATORY_CARE_PROVIDER_SITE_OTHER): Payer: Self-pay | Admitting: Vascular Surgery

## 2020-01-07 ENCOUNTER — Other Ambulatory Visit
Admission: RE | Admit: 2020-01-07 | Discharge: 2020-01-07 | Disposition: A | Discharge status: Home / Self Care | Payer: BC Managed Care – PPO | Source: Ambulatory Visit | Attending: Vascular Surgery | Admitting: Vascular Surgery

## 2020-01-07 ENCOUNTER — Ambulatory Visit (INDEPENDENT_AMBULATORY_CARE_PROVIDER_SITE_OTHER): Payer: BC Managed Care – PPO | Admitting: Nurse Practitioner

## 2020-01-07 ENCOUNTER — Ambulatory Visit: Admit: 2020-01-07 | Payer: BC Managed Care – PPO | Admitting: Vascular Surgery

## 2020-01-07 VITALS — BP 134/81 | HR 76 | Resp 16 | Wt 156.0 lb

## 2020-01-07 DIAGNOSIS — E785 Hyperlipidemia, unspecified: Secondary | ICD-10-CM | POA: Diagnosis not present

## 2020-01-07 DIAGNOSIS — I10 Essential (primary) hypertension: Secondary | ICD-10-CM | POA: Diagnosis present

## 2020-01-07 DIAGNOSIS — Z87891 Personal history of nicotine dependence: Secondary | ICD-10-CM | POA: Diagnosis not present

## 2020-01-07 DIAGNOSIS — Z7901 Long term (current) use of anticoagulants: Secondary | ICD-10-CM | POA: Diagnosis not present

## 2020-01-07 DIAGNOSIS — T82868A Thrombosis of vascular prosthetic devices, implants and grafts, initial encounter: Principal | ICD-10-CM | POA: Diagnosis present

## 2020-01-07 DIAGNOSIS — M79604 Pain in right leg: Secondary | ICD-10-CM | POA: Diagnosis not present

## 2020-01-07 DIAGNOSIS — I70221 Atherosclerosis of native arteries of extremities with rest pain, right leg: Secondary | ICD-10-CM | POA: Diagnosis present

## 2020-01-07 DIAGNOSIS — Z20822 Contact with and (suspected) exposure to covid-19: Secondary | ICD-10-CM | POA: Diagnosis not present

## 2020-01-07 DIAGNOSIS — Z452 Encounter for adjustment and management of vascular access device: Secondary | ICD-10-CM

## 2020-01-07 DIAGNOSIS — I998 Other disorder of circulatory system: Secondary | ICD-10-CM | POA: Diagnosis present

## 2020-01-07 DIAGNOSIS — Y831 Surgical operation with implant of artificial internal device as the cause of abnormal reaction of the patient, or of later complication, without mention of misadventure at the time of the procedure: Secondary | ICD-10-CM | POA: Diagnosis present

## 2020-01-07 DIAGNOSIS — Z01812 Encounter for preprocedural laboratory examination: Secondary | ICD-10-CM | POA: Insufficient documentation

## 2020-01-07 DIAGNOSIS — Z01810 Encounter for preprocedural cardiovascular examination: Secondary | ICD-10-CM | POA: Diagnosis not present

## 2020-01-07 DIAGNOSIS — Z79899 Other long term (current) drug therapy: Secondary | ICD-10-CM

## 2020-01-07 DIAGNOSIS — Y832 Surgical operation with anastomosis, bypass or graft as the cause of abnormal reaction of the patient, or of later complication, without mention of misadventure at the time of the procedure: Secondary | ICD-10-CM | POA: Diagnosis not present

## 2020-01-07 HISTORY — PX: CENTRAL LINE INSERTION: CATH118232

## 2020-01-07 HISTORY — PX: LOWER EXTREMITY ANGIOGRAM: SHX5508

## 2020-01-07 HISTORY — PX: INSERTION OF ILIAC STENT: SHX6256

## 2020-01-07 LAB — CBC
HCT: 37.8 % — ABNORMAL LOW (ref 39.0–52.0)
Hemoglobin: 12.9 g/dL — ABNORMAL LOW (ref 13.0–17.0)
MCH: 32.2 pg (ref 26.0–34.0)
MCHC: 34.1 g/dL (ref 30.0–36.0)
MCV: 94.3 fL (ref 80.0–100.0)
Platelets: 145 10*3/uL — ABNORMAL LOW (ref 150–400)
RBC: 4.01 MIL/uL — ABNORMAL LOW (ref 4.22–5.81)
RDW: 13.1 % (ref 11.5–15.5)
WBC: 10.3 10*3/uL (ref 4.0–10.5)
nRBC: 0 % (ref 0.0–0.2)

## 2020-01-07 LAB — URINE DRUG SCREEN, QUALITATIVE (ARMC ONLY)
Amphetamines, Ur Screen: NOT DETECTED
Barbiturates, Ur Screen: NOT DETECTED
Benzodiazepine, Ur Scrn: NOT DETECTED
Cannabinoid 50 Ng, Ur ~~LOC~~: POSITIVE — AB
Cocaine Metabolite,Ur ~~LOC~~: NOT DETECTED
MDMA (Ecstasy)Ur Screen: NOT DETECTED
Methadone Scn, Ur: NOT DETECTED
Opiate, Ur Screen: NOT DETECTED
Phencyclidine (PCP) Ur S: NOT DETECTED
Tricyclic, Ur Screen: NOT DETECTED

## 2020-01-07 LAB — BASIC METABOLIC PANEL
Anion gap: 13 (ref 5–15)
BUN: 18 mg/dL (ref 8–23)
CO2: 24 mmol/L (ref 22–32)
Calcium: 9.5 mg/dL (ref 8.9–10.3)
Chloride: 102 mmol/L (ref 98–111)
Creatinine, Ser: 1 mg/dL (ref 0.61–1.24)
GFR calc Af Amer: >60 mL/min (ref >60)
GFR calc non Af Amer: >60 mL/min (ref >60)
Glucose, Bld: 119 mg/dL — ABNORMAL HIGH (ref 70–99)
Potassium: 3.8 mmol/L (ref 3.5–5.1)
Sodium: 139 mmol/L (ref 135–145)

## 2020-01-07 LAB — CBC WITH DIFFERENTIAL/PLATELET
Abs Immature Granulocytes: 0.04 10*3/uL (ref 0.00–0.07)
Basophils Absolute: 0 10*3/uL (ref 0.0–0.1)
Basophils Relative: 0 %
Eosinophils Absolute: 0.1 10*3/uL (ref 0.0–0.5)
Eosinophils Relative: 1 %
HCT: 39.7 % (ref 39.0–52.0)
Hemoglobin: 13.8 g/dL (ref 13.0–17.0)
Immature Granulocytes: 0 %
Lymphocytes Relative: 14 %
Lymphs Abs: 1.3 10*3/uL (ref 0.7–4.0)
MCH: 31.2 pg (ref 26.0–34.0)
MCHC: 34.8 g/dL (ref 30.0–36.0)
MCV: 89.6 fL (ref 80.0–100.0)
Monocytes Absolute: 0.7 10*3/uL (ref 0.1–1.0)
Monocytes Relative: 8 %
Neutro Abs: 7.2 10*3/uL (ref 1.7–7.7)
Neutrophils Relative %: 77 %
Platelets: 179 10*3/uL (ref 150–400)
RBC: 4.43 MIL/uL (ref 4.22–5.81)
RDW: 13 % (ref 11.5–15.5)
WBC: 9.4 10*3/uL (ref 4.0–10.5)
nRBC: 0 % (ref 0.0–0.2)

## 2020-01-07 LAB — GLUCOSE, CAPILLARY: Glucose-Capillary: 119 mg/dL — ABNORMAL HIGH (ref 70–99)

## 2020-01-07 LAB — HEPARIN LEVEL (UNFRACTIONATED): Heparin Unfractionated: 2.06 IU/mL — ABNORMAL HIGH (ref 0.30–0.70)

## 2020-01-07 LAB — SARS CORONAVIRUS 2 BY RT PCR (HOSPITAL ORDER, PERFORMED IN ~~LOC~~ HOSPITAL LAB): SARS Coronavirus 2: NEGATIVE

## 2020-01-07 LAB — SARS CORONAVIRUS 2 (TAT 6-24 HRS): SARS Coronavirus 2: NEGATIVE

## 2020-01-07 LAB — FIBRINOGEN: Fibrinogen: 386 mg/dL (ref 210–475)

## 2020-01-07 LAB — MRSA PCR SCREENING: MRSA by PCR: NEGATIVE

## 2020-01-07 LAB — PROTIME-INR
INR: 1.2 (ref 0.8–1.2)
Prothrombin Time: 15.1 seconds (ref 11.4–15.2)

## 2020-01-07 LAB — APTT: aPTT: 32 seconds (ref 24–36)

## 2020-01-07 SURGERY — LOWER EXTREMITY ANGIOGRAPHY
Anesthesia: Moderate Sedation | Site: Leg Lower | Laterality: Right

## 2020-01-07 SURGERY — ANGIOGRAM, LOWER EXTREMITY
Anesthesia: General | Site: Neck | Laterality: Right

## 2020-01-07 MED ORDER — ALTEPLASE 2 MG IJ SOLR
INTRAMUSCULAR | Status: DC | PRN
  Administered 2020-01-07: 6 mg

## 2020-01-07 MED ORDER — CHLORHEXIDINE GLUCONATE CLOTH 2 % EX PADS: 6.0000 | MEDICATED_PAD | Freq: Once | CUTANEOUS | Status: DC

## 2020-01-07 MED ORDER — LACTATED RINGERS IV SOLN: INTRAVENOUS | Status: DC | PRN

## 2020-01-07 MED ORDER — ROSUVASTATIN CALCIUM 20 MG PO TABS: 20.0000 mg | ORAL_TABLET | Freq: Every day | ORAL | Status: DC

## 2020-01-07 MED ORDER — SODIUM CHLORIDE 0.9 % IV SOLN: 250.0000 mL | INTRAVENOUS | Status: DC | PRN

## 2020-01-07 MED ORDER — CEFAZOLIN SODIUM-DEXTROSE 2-3 GM-%(50ML) IV SOLR
INTRAVENOUS | Status: DC | PRN
  Administered 2020-01-07: 2 g via INTRAVENOUS

## 2020-01-07 MED ORDER — ONDANSETRON HCL 4 MG/2ML IJ SOLN: 4.0000 mg | Freq: Four times a day (QID) | INTRAMUSCULAR | Status: DC | PRN

## 2020-01-07 MED ORDER — PROPOFOL 10 MG/ML IV BOLUS
INTRAVENOUS | Status: DC | PRN
  Administered 2020-01-07: 200 mg via INTRAVENOUS
  Administered 2020-01-07: 100 mg via INTRAVENOUS

## 2020-01-07 MED ORDER — ROCURONIUM BROMIDE 100 MG/10ML IV SOLN
INTRAVENOUS | Status: DC | PRN
  Administered 2020-01-07: 50 mg via INTRAVENOUS
  Administered 2020-01-07: 10 mg via INTRAVENOUS

## 2020-01-07 MED ORDER — FENTANYL CITRATE (PF) 100 MCG/2ML IJ SOLN
INTRAMUSCULAR | Status: AC
  Filled 2020-01-07: qty 2

## 2020-01-07 MED ORDER — PROPOFOL 10 MG/ML IV BOLUS
INTRAVENOUS | Status: AC
  Filled 2020-01-07: qty 20

## 2020-01-07 MED ORDER — EPHEDRINE 5 MG/ML INJ
INTRAVENOUS | Status: AC
  Filled 2020-01-07: qty 10

## 2020-01-07 MED ORDER — ALTEPLASE 2 MG IJ SOLR
INTRAMUSCULAR | Status: AC
  Filled 2020-01-07: qty 6

## 2020-01-07 MED ORDER — SODIUM CHLORIDE 0.9 % IV SOLN
1.0000 mg/h | INTRAVENOUS | Status: DC
  Filled 2020-01-07: qty 10

## 2020-01-07 MED ORDER — SODIUM CHLORIDE 0.9 % IV SOLN
1.0000 mg/h | INTRAVENOUS | Status: DC
  Filled 2020-01-07 (×5): qty 10

## 2020-01-07 MED ORDER — FENTANYL CITRATE (PF) 100 MCG/2ML IJ SOLN
INTRAMUSCULAR | Status: DC | PRN
Start: 2020-01-07 — End: 2020-01-07
  Administered 2020-01-07: 100 ug via INTRAVENOUS
  Administered 2020-01-07 (×2): 50 ug via INTRAVENOUS

## 2020-01-07 MED ORDER — SODIUM CHLORIDE 0.9% FLUSH
3.0000 mL | Freq: Two times a day (BID) | INTRAVENOUS | Status: DC
  Administered 2020-01-07: 3 mL via INTRAVENOUS

## 2020-01-07 MED ORDER — ACETAMINOPHEN 650 MG RE SUPP
650.0000 mg | Freq: Four times a day (QID) | RECTAL | Status: DC | PRN
  Filled 2020-01-07: qty 1

## 2020-01-07 MED ORDER — SUGAMMADEX SODIUM 200 MG/2ML IV SOLN
INTRAVENOUS | Status: DC | PRN
  Administered 2020-01-07: 200 mg via INTRAVENOUS

## 2020-01-07 MED ORDER — DEXMEDETOMIDINE HCL 200 MCG/2ML IV SOLN
INTRAVENOUS | Status: DC | PRN
  Administered 2020-01-07: 20 ug via INTRAVENOUS

## 2020-01-07 MED ORDER — SODIUM CHLORIDE 0.9 % IV SOLN: INTRAVENOUS | Status: AC

## 2020-01-07 MED ORDER — GABAPENTIN 300 MG PO CAPS
300.0000 mg | ORAL_CAPSULE | Freq: Every day | ORAL | Status: DC
  Administered 2020-01-07 – 2020-01-08 (×2): 300 mg via ORAL
  Filled 2020-01-07 (×3): qty 1

## 2020-01-07 MED ORDER — ONDANSETRON HCL 4 MG PO TABS: 4.0000 mg | ORAL_TABLET | Freq: Four times a day (QID) | ORAL | Status: DC | PRN

## 2020-01-07 MED ORDER — CEFAZOLIN SODIUM-DEXTROSE 2-4 GM/100ML-% IV SOLN
INTRAVENOUS | Status: AC
  Filled 2020-01-07: qty 100

## 2020-01-07 MED ORDER — ASPIRIN EC 81 MG PO TBEC: 81.0000 mg | DELAYED_RELEASE_TABLET | Freq: Every day | ORAL | Status: DC

## 2020-01-07 MED ORDER — DEXAMETHASONE SODIUM PHOSPHATE 10 MG/ML IJ SOLN
INTRAMUSCULAR | Status: AC
  Filled 2020-01-07: qty 1

## 2020-01-07 MED ORDER — CHLORHEXIDINE GLUCONATE 0.12 % MT SOLN
OROMUCOSAL | Status: AC
  Filled 2020-01-07: qty 15

## 2020-01-07 MED ORDER — ONDANSETRON HCL 4 MG/2ML IJ SOLN
INTRAMUSCULAR | Status: AC
  Filled 2020-01-07: qty 2

## 2020-01-07 MED ORDER — ONDANSETRON HCL 4 MG/2ML IJ SOLN
INTRAMUSCULAR | Status: DC | PRN
  Administered 2020-01-07: 4 mg via INTRAVENOUS

## 2020-01-07 MED ORDER — SODIUM CHLORIDE 0.9 % IV SOLN
INTRAVENOUS | Status: DC | PRN
  Administered 2020-01-07: 100 mL via INTRAMUSCULAR

## 2020-01-07 MED ORDER — CEFAZOLIN SODIUM-DEXTROSE 2-4 GM/100ML-% IV SOLN: 2.0000 g | INTRAVENOUS | Status: DC

## 2020-01-07 MED ORDER — ACETAMINOPHEN 325 MG PO TABS: 650.0000 mg | ORAL_TABLET | Freq: Four times a day (QID) | ORAL | Status: DC | PRN

## 2020-01-07 MED ORDER — ORAL CARE MOUTH RINSE: 15.0000 mL | Freq: Once | OROMUCOSAL | Status: AC

## 2020-01-07 MED ORDER — OXYCODONE HCL 5 MG PO TABS: 5.0000 mg | ORAL_TABLET | ORAL | Status: DC | PRN

## 2020-01-07 MED ORDER — HEPARIN (PORCINE) 25000 UT/250ML-% IV SOLN
600.0000 [IU]/h | INTRAVENOUS | Status: DC
  Administered 2020-01-07: 600 [IU]/h via INTRAVENOUS
  Filled 2020-01-07 (×3): qty 250

## 2020-01-07 MED ORDER — LIDOCAINE HCL (CARDIAC) PF 100 MG/5ML IV SOSY
PREFILLED_SYRINGE | INTRAVENOUS | Status: DC | PRN
  Administered 2020-01-07: 100 mg via INTRAVENOUS

## 2020-01-07 MED ORDER — CHLORHEXIDINE GLUCONATE CLOTH 2 % EX PADS
6.0000 | MEDICATED_PAD | Freq: Every day | CUTANEOUS | Status: DC
  Administered 2020-01-07 – 2020-01-09 (×2): 6 via TOPICAL

## 2020-01-07 MED ORDER — SODIUM CHLORIDE 0.9 % IV SOLN: 0.5000 mg/h | INTRAVENOUS | Status: DC

## 2020-01-07 MED ORDER — MORPHINE SULFATE (PF) 2 MG/ML IV SOLN
2.0000 mg | INTRAVENOUS | Status: DC | PRN
  Administered 2020-01-08: 2 mg via INTRAVENOUS
  Filled 2020-01-07: qty 1

## 2020-01-07 MED ORDER — CHLORHEXIDINE GLUCONATE 0.12 % MT SOLN
15.0000 mL | Freq: Once | OROMUCOSAL | Status: AC
  Administered 2020-01-07: 15 mL via OROMUCOSAL

## 2020-01-07 MED ORDER — TAMSULOSIN HCL 0.4 MG PO CAPS
0.4000 mg | ORAL_CAPSULE | Freq: Every day | ORAL | Status: DC
  Administered 2020-01-08 – 2020-01-09 (×2): 0.4 mg via ORAL
  Filled 2020-01-07 (×2): qty 1

## 2020-01-07 MED ORDER — LACTATED RINGERS IV SOLN: INTRAVENOUS | Status: DC

## 2020-01-07 MED ORDER — ROSUVASTATIN CALCIUM 10 MG PO TABS
20.0000 mg | ORAL_TABLET | Freq: Every day | ORAL | Status: DC
  Administered 2020-01-08 – 2020-01-09 (×2): 20 mg via ORAL
  Filled 2020-01-07 (×2): qty 2
  Filled 2020-01-07: qty 1

## 2020-01-07 MED ORDER — SODIUM CHLORIDE 0.9 % IV SOLN
0.5000 mg/h | INTRAVENOUS | Status: DC
  Administered 2020-01-08 (×2): 0.5 mg/h
  Filled 2020-01-07 (×2): qty 10

## 2020-01-07 MED ORDER — SODIUM CHLORIDE 0.9 % IV SOLN
0.5000 mg/h | INTRAVENOUS | Status: DC
  Administered 2020-01-07: 0.5 mg/h
  Filled 2020-01-07 (×3): qty 10

## 2020-01-07 MED ORDER — TAMSULOSIN HCL 0.4 MG PO CAPS: 0.4000 mg | ORAL_CAPSULE | Freq: Every day | ORAL | Status: DC

## 2020-01-07 MED ORDER — SODIUM CHLORIDE 0.9% FLUSH: 3.0000 mL | INTRAVENOUS | Status: DC | PRN

## 2020-01-07 SURGICAL SUPPLY — 78 items
APPLIER CLIP 11 MED OPEN (CLIP)
APPLIER CLIP 9.375 SM OPEN (CLIP)
BAG COUNTER SPONGE EZ (MISCELLANEOUS) ×5 IMPLANT
BAG DECANTER FOR FLEXI CONT (MISCELLANEOUS) ×6 IMPLANT
BAG ISOLATATION DRAPE 20X20 ST (DRAPES) IMPLANT
BALLN LUTONIX DCB 7X60X130 (BALLOONS) ×6
BALLOON LUTONIX DCB 7X60X130 (BALLOONS) ×4 IMPLANT
BLADE SURG 15 STRL LF DISP TIS (BLADE) ×4 IMPLANT
BLADE SURG 15 STRL SS (BLADE) ×2
BLADE SURG SZ11 CARB STEEL (BLADE) ×6 IMPLANT
BOOT SUTURE AID YELLOW STND (SUTURE) ×6 IMPLANT
BRUSH SCRUB EZ  4% CHG (MISCELLANEOUS) ×2
BRUSH SCRUB EZ 4% CHG (MISCELLANEOUS) ×4 IMPLANT
CANISTER SUCT 1200ML W/VALVE (MISCELLANEOUS) ×6 IMPLANT
CATH BEACON 5 .035 65 KMP TIP (CATHETERS) ×6 IMPLANT
CATH BEACON 5 .038 100 VERT TP (CATHETERS) ×6 IMPLANT
CATH INFUS 135CMX50CM (CATHETERS) ×6 IMPLANT
CHLORAPREP W/TINT 26 (MISCELLANEOUS) ×6 IMPLANT
CLIP APPLIE 11 MED OPEN (CLIP) IMPLANT
CLIP APPLIE 9.375 SM OPEN (CLIP) IMPLANT
COUNTER SPONGE BAG EZ (MISCELLANEOUS) ×1
COVER WAND RF STERILE (DRAPES) ×6 IMPLANT
DERMABOND ADVANCED (GAUZE/BANDAGES/DRESSINGS) ×2
DERMABOND ADVANCED .7 DNX12 (GAUZE/BANDAGES/DRESSINGS) ×4 IMPLANT
DEVICE INFLAT 30 PLUS (MISCELLANEOUS) ×6 IMPLANT
DRAPE INCISE IOBAN 66X45 STRL (DRAPES) ×6 IMPLANT
DRAPE ISOLATE BAG 20X20 STRL (DRAPES)
ELECT CAUTERY BLADE 6.4 (BLADE) ×6 IMPLANT
ELECT REM PT RETURN 9FT ADLT (ELECTROSURGICAL) ×6
ELECTRODE REM PT RTRN 9FT ADLT (ELECTROSURGICAL) ×4 IMPLANT
GLIDEWIRE ADV .035X260CM (WIRE) ×6 IMPLANT
GLOVE BIO SURGEON STRL SZ7 (GLOVE) ×12 IMPLANT
GLOVE INDICATOR 7.5 STRL GRN (GLOVE) ×6 IMPLANT
GOWN STRL REUS W/ TWL LRG LVL3 (GOWN DISPOSABLE) ×4 IMPLANT
GOWN STRL REUS W/ TWL XL LVL3 (GOWN DISPOSABLE) ×8 IMPLANT
GOWN STRL REUS W/TWL LRG LVL3 (GOWN DISPOSABLE) ×2
GOWN STRL REUS W/TWL XL LVL3 (GOWN DISPOSABLE) ×4
GUIDEWIRE SUPER STIFF .035X180 (WIRE) ×6 IMPLANT
HEMOSTAT SURGICEL 2X3 (HEMOSTASIS) ×6 IMPLANT
IV NS 500ML (IV SOLUTION) ×2
IV NS 500ML BAXH (IV SOLUTION) ×4 IMPLANT
KIT TURNOVER KIT A (KITS) ×6 IMPLANT
LABEL OR SOLS (LABEL) ×6 IMPLANT
LOOP RED MAXI  1X406MM (MISCELLANEOUS) ×4
LOOP VESSEL MAXI 1X406 RED (MISCELLANEOUS) ×8 IMPLANT
LOOP VESSEL MINI 0.8X406 BLUE (MISCELLANEOUS) ×8 IMPLANT
LOOPS BLUE MINI 0.8X406MM (MISCELLANEOUS) ×4
NDL SAFETY ECLIPSE 18X1.5 (NEEDLE) ×4 IMPLANT
NEEDLE HYPO 18GX1.5 SHARP (NEEDLE) ×2
NS IRRIG 500ML POUR BTL (IV SOLUTION) ×6 IMPLANT
PACK ANGIOGRAPHY (CUSTOM PROCEDURE TRAY) ×6 IMPLANT
PACK BASIN MAJOR ARMC (MISCELLANEOUS) ×6 IMPLANT
PACK UNIVERSAL (MISCELLANEOUS) ×6 IMPLANT
SHEATH BRITE TIP 4FRX11 (SHEATH) ×6 IMPLANT
SHEATH BRITE TIP 5FRX11 (SHEATH) ×6 IMPLANT
SHEATH DESTIN RDC 6FR 45 (SHEATH) ×6 IMPLANT
STENT LIFESTAR 8X80X80 (Permanent Stent) ×6 IMPLANT
SUT MNCRL 4-0 (SUTURE)
SUT MNCRL 4-0 27XMFL (SUTURE)
SUT PROLENE 5 0 RB 1 DA (SUTURE) IMPLANT
SUT PROLENE 6 0 BV (SUTURE) IMPLANT
SUT PROLENE 7 0 BV 1 (SUTURE) IMPLANT
SUT SILK 0 SH 30 (SUTURE) ×6 IMPLANT
SUT SILK 2 0 (SUTURE)
SUT SILK 2-0 18XBRD TIE 12 (SUTURE) IMPLANT
SUT SILK 3 0 (SUTURE)
SUT SILK 3-0 18XBRD TIE 12 (SUTURE) IMPLANT
SUT SILK 4 0 (SUTURE)
SUT SILK 4-0 18XBRD TIE 12 (SUTURE) IMPLANT
SUT VIC AB 2-0 CT1 27 (SUTURE)
SUT VIC AB 2-0 CT1 TAPERPNT 27 (SUTURE) IMPLANT
SUT VIC AB 3-0 SH 27 (SUTURE)
SUT VIC AB 3-0 SH 27X BRD (SUTURE) IMPLANT
SUT VICRYL+ 3-0 36IN CT-1 (SUTURE) IMPLANT
SUTURE MNCRL 4-0 27XMF (SUTURE) IMPLANT
SYR 20ML LL LF (SYRINGE) ×6 IMPLANT
SYR 5ML LL (SYRINGE) ×6 IMPLANT
TRAY FOLEY MTR SLVR 16FR STAT (SET/KITS/TRAYS/PACK) ×6 IMPLANT

## 2020-01-07 NOTE — Telephone Encounter (Signed)
Patient was seen in office today and has been scheduled for a RLE angio poss intervention and poss open revision of bypass graft with Dr. Wyn Quaker today. Patient will arrive to same day surgery at 1:00 pm and do his covid test today at the [facility]. Patient was handed pre-surgical instructions before he left the office today.

## 2020-01-07 NOTE — Progress Notes (Signed)
Need urine sample prior to surgery. Patient given of LR. Still unable to urinate. Per Anesthesia do bladder scan. Bladder scan shows 2mL.

## 2020-01-07 NOTE — Anesthesia Procedure Notes (Signed)
Procedure Name: Intubation Date/Time: 01/07/2020 5:35 PM Performed by: Jonna Clark, CRNA Pre-anesthesia Checklist: Patient identified, Patient being monitored, Timeout performed, Emergency Drugs available and Suction available Patient Re-evaluated:Patient Re-evaluated prior to induction Oxygen Delivery Method: Circle system utilized Preoxygenation: Pre-oxygenation with 100% oxygen Induction Type: IV induction Ventilation: Mask ventilation without difficulty Laryngoscope Size: Mac and 3 Grade View: Grade I Tube type: Oral Tube size: 7.5 mm Number of attempts: 1 Airway Equipment and Method: Stylet Placement Confirmation: ETT inserted through vocal cords under direct vision,  positive ETCO2 and breath sounds checked- equal and bilateral Secured at: 22 cm Tube secured with: Tape Dental Injury: Teeth and Oropharynx as per pre-operative assessment

## 2020-01-07 NOTE — Op Note (Signed)
[facility]   PROCEDURE NOTE  PROCEDURE: 1. Left IJ triple lumen central venous catheter placement 2. Left IJ cannulation under ultrasound guidance  PRE-OPERATIVE DIAGNOSIS: ischemic right leg  POST-OPERATIVE DIAGNOSIS: same as above  SURGEON: Festus Barren, MD  ANESTHESIA:  None  ESTIMATED BLOOD LOSS: minimal  FINDING(S): none  SPECIMEN(S):  none  INDICATIONS:   Travis Salas is a 64 y.o. male who presents with need for venous access.  The patient presents for central venous catheter placement.  The patient is aware the risks of central venous catheter placement include but are not limited to: bleeding, infection, central venous injury, pneumothorax, possible venous stenosis, possible malpositioning in the venous system, and possible infections related to long-term catheter presence. The patient was aware of these risks and agreed to proceed.  DESCRIPTION: After written informed consent was obtained from the patient and/or family, the patient was placed supine in the hospital bed.  The patient was prepped with chloraprep and draped in the standard fashion for a chest or neck central venous catheter placement.  I anesthesized the neck cannulation site with 1% lidocaine then under ultrasound guidance, the left internal jugular vein was cannulated with the 18 gauge needle and a permanent image was recorded.  A J wire was then placed down in the superior vena cava.  After a skin nick and dilatation, the triple lumen central venous catheter was placed over the wire and the wire was removed.  Each port was aspirated and flushed with sterile normal saline.  The catheter was secured in placed with three interrupted stitches of 3-0 Silk tied to the catheter.  The catheter was dressed with sterile dressing.  Stat CXR is pending  COMPLICATIONS: none apparent  CONDITION: stable  Festus Barren 01/07/2020, 6:02 PM     This note was created with Dragon Medical  transcription system. Any errors in dictation are purely unintentional.

## 2020-01-07 NOTE — Progress Notes (Signed)
Subjective:    Patient ID: Travis Salas, male    DOB: 1956-05-06, 64 y.o.   MRN: [id number] Chief Complaint  Patient presents with  . Follow-up    ultrasound follow up    The patient returns to the office for followup and review of the noninvasive studies.  The patient was previously seen on 12/29/2019 following an ischemic leg event.  At that time the patient had normal blood flow to his right lower extremity.  However this weekend, there has been a significant deterioration in the lower extremity symptoms.  The patient notes interval shortening of their claudication distance and development of mild rest pain symptoms. No new ulcers or wounds have occurred since the last visit.  There have been no significant changes to the patient's overall health care.  The patient denies amaurosis fugax or recent TIA symptoms. There are no recent neurological changes noted. The patient denies history of DVT, PE or superficial thrombophlebitis. The patient denies recent episodes of angina or shortness of breath.   ABI's Rt=0.13 and Lt=0.60 (previous ABI's Rt=1.10 and Lt=0.72) Duplex US of the  lower extremity arterial system shows no flow adequately detected in the right SFA, popliteal artery or bypass graft.  There is minimal flow noted at the distal lower leg region.  The left lower extremity has monophasic tibial artery waveforms.   Review of Systems  Cardiovascular:       Rest pain of right lower extremity  Neurological: Positive for numbness.  All other systems reviewed and are negative.      Objective:   Physical Exam Vitals reviewed.  HENT:     Head: Normocephalic.  Cardiovascular:     Rate and Rhythm: Normal rate.     Pulses:          Dorsalis pedis pulses are 0 on the right side.       Posterior tibial pulses are 0 on the right side.  Pulmonary:     Effort: Pulmonary effort is normal.  Musculoskeletal:        General: Tenderness present.  Neurological:     Mental Status:  He is alert and oriented to person, place, and time.     Motor: Weakness present.  Psychiatric:        Mood and Affect: Mood normal.        Behavior: Behavior normal.        Thought Content: Thought content normal.        Judgment: Judgment normal.     BP 134/81 (BP Location: Right Arm)   Pulse 76   Resp 16   Wt 156 lb (70.8 kg)   BMI 24.43 kg/m   Past Medical History:  Diagnosis Date  . Collagen vascular disease (HCC)   . Hyperlipidemia   . Hypertension   . Peripheral vascular disease (HCC)     Social History   Socioeconomic History  . Marital status: Married    Spouse name: Selena Batten  . Number of children: Not on file  . Years of education: Not on file  . Highest education level: Not on file  Occupational History  . Not on file  Tobacco Use  . Smoking status: Former Smoker    Packs/day: 1.50    Years: 46.00    Pack years: 69.00    Types: Cigarettes    Quit date: 02/05/2015    Years since quitting: 4.9  . Smokeless tobacco: Never Used  Vaping Use  . Vaping Use: Never used  Substance  and Sexual Activity  . Alcohol use: Yes    Alcohol/week: 7.0 standard drinks    Types: 7 Cans of beer per week  . Drug use: Yes    Types: Marijuana    Comment: occasional  . Sexual activity: Not on file  Other Topics Concern  . Not on file  Social History Narrative   Lives at home with wife in private residence   Social Determinants of Health   Financial Resource Strain:   . Difficulty of Paying Living Expenses: Not on file  Food Insecurity:   . Worried About Programme researcher, broadcasting/film/video in the Last Year: Not on file  . Ran Out of Food in the Last Year: Not on file  Transportation Needs:   . Lack of Transportation (Medical): Not on file  . Lack of Transportation (Non-Medical): Not on file  Physical Activity:   . Days of Exercise per Week: Not on file  . Minutes of Exercise per Session: Not on file  Stress:   . Feeling of Stress : Not on file  Social Connections:   . Frequency of  Communication with Friends and Family: Not on file  . Frequency of Social Gatherings with Friends and Family: Not on file  . Attends Religious Services: Not on file  . Active Member of Clubs or Organizations: Not on file  . Attends Banker Meetings: Not on file  . Marital Status: Not on file  Intimate Partner Violence:   . Fear of Current or Ex-Partner: Not on file  . Emotionally Abused: Not on file  . Physically Abused: Not on file  . Sexually Abused: Not on file    Past Surgical History:  Procedure Laterality Date  . BYPASS GRAFT ANGIOGRAPHY Right 11/21/2019   Procedure: BYPASS GRAFT ANGIOGRAPHY;  Surgeon: Griffith Citron, MD;  Location: [facility];  Service: Cardiovascular;  Laterality: Right;  . ENDARTERECTOMY FEMORAL Right 12/23/2015   Procedure: COMMON, FEMORAL, SUPERFICIAL, AND PROFUNDA ENDARTERECTOMY;  Surgeon: Annice Needy, MD;  Location: [facility];  Service: Vascular;  Laterality: Right;  . FEMORAL-TIBIAL BYPASS GRAFT Right 12/23/2015   Procedure: BYPASS GRAFT FEMORAL-TIBIAL ARTERY, COMMON FEMORAL AND PROFUNDIS FEMORAL WITH PATCH ANGIOPLASTY, POPLITEAL ANTERIOR ARTERY BYPASS, ILIOFEMORAL ANGIOGRAM VIABHAM STENT 8 MM DIAMETER X 5 CM LENGTH;  Surgeon: Annice Needy, MD;  Location: [facility];  Service: Vascular;  Laterality: Right;  . LOWER EXTREMITY ANGIOGRAPHY Right 06/19/2019   Procedure: LOWER EXTREMITY ANGIOGRAPHY;  Surgeon: Annice Needy, MD;  Location: [facility];  Service: Cardiovascular;  Laterality: Right;  . LOWER EXTREMITY ANGIOGRAPHY Right 06/20/2019   Procedure: Lower Extremity Angiography;  Surgeon: Annice Needy, MD;  Location: [facility];  Service: Cardiovascular;  Laterality: Right;  . PERIPHERAL VASCULAR CATHETERIZATION Right 11/08/2015   Procedure: Lower Extremity Angiography;  Surgeon: Annice Needy, MD;  Location: [facility];  Service: Cardiovascular;  Laterality: Right;  . PERIPHERAL VASCULAR CATHETERIZATION Right 12/13/2015    Procedure: Lower Extremity Angiography;  Surgeon: Annice Needy, MD;  Location: [facility];  Service: Cardiovascular;  Laterality: Right;  . PERIPHERAL VASCULAR THROMBECTOMY Right 11/21/2019   Procedure: PERIPHERAL VASCULAR THROMBECTOMY;  Surgeon: Griffith Citron, MD;  Location: [facility];  Service: Cardiovascular;  Laterality: Right;  . PERIPHERAL VASCULAR THROMBECTOMY Right 11/22/2019   Procedure: PERIPHERAL VASCULAR THROMBECTOMY;  Surgeon: Griffith Citron, MD;  Location: [facility];  Service: Cardiovascular;  Laterality: Right;    Family History  Problem  Relation Age of Onset  . Diabetes Mother   . Hypertension Brother     Allergies  Allergen Reactions  . Hydrocodone Swelling       Assessment & Plan:   1. Hyperlipidemia, unspecified hyperlipidemia type Continue statin as ordered and reviewed, no changes at this time   2. Essential hypertension Continue antihypertensive medications as already ordered, these medications have been reviewed and there are no changes at this time.   3. Ischemic leg   Recommend:  The patient has evidence of severe atherosclerotic changes of the right lower extremity associated with tissue loss of the foot.  This represents a limb threatening ischemia and places the patient at the risk for limb loss.  Patient should undergo angiography of the lower extremities with the hope for intervention for limb salvage. Previous angiogram has also indicated that surgical intervention may be necessary to repair his bypass graft.  Possible revision of the bypass graft may be necessary. The risks and benefits as well as the alternative therapies was discussed in detail with the patient.  All questions were answered.  Patient agrees to proceed with angiography.  The patient will follow up with me in the office after the procedure.      Current Outpatient Medications on File Prior to Visit  Medication Sig Dispense Refill  . ciclopirox  (PENLAC) 8 % solution APPLY OVER NAIL AND SURROUNDING SKIN DAILY AT BEDTIME OVER PREVIOUS COAT. AFTER 7 DAYS, MAY REMOVE WITH ALCOHOL AND CONTINUE CYCLE 6.6 mL 0  . ELIQUIS 5 MG TABS tablet TAKE 1 TABLET(5 MG) BY MOUTH TWICE DAILY 60 tablet 5  . furosemide (LASIX) 20 MG tablet TK 1 T PO QD PRN.  2  . gabapentin (NEURONTIN) 300 MG capsule TAKE 1 CAPSULE BY MOUTH AT BEDTIME 60 capsule 0  . Omega-3 Fatty Acids (FISH OIL) 1200 MG CAPS Take 2,400 mg by mouth 2 (two) times daily.    . rosuvastatin (CRESTOR) 20 MG tablet Take 20 mg by mouth daily.    . sertraline (ZOLOFT) 100 MG tablet TK 1 T PO QD  3  . tamsulosin (FLOMAX) 0.4 MG CAPS capsule Take 0.4 mg by mouth daily.    . Multiple Vitamin (MULTIVITAMIN WITH MINERALS) TABS tablet Take 1 tablet by mouth every evening. (Patient not taking: Reported on 12/29/2019)    . Specialty Vitamins Products (ONE-A-DAY PROSTATE PO) Take 1 tablet by mouth daily. "Super Beta Prostate" (Patient not taking: Reported on 12/29/2019)    . Zn-Pyg Afri-Nettle-Saw Palmet (SAW PALMETTO COMPLEX PO) Take 2 tablets by mouth daily. (Patient not taking: Reported on 12/29/2019)     No current facility-administered medications on file prior to visit.    There are no Patient Instructions on file for this visit. No follow-ups on file.   Georgiana Spinner, NP

## 2020-01-07 NOTE — Interval H&P Note (Signed)
History and Physical Interval Note:  01/07/2020 2:04 PM  Travis Salas  has presented today for surgery, with the diagnosis of ISCHEMIC LEG.  The various methods of treatment have been discussed with the patient and family. After consideration of risks, benefits and other options for treatment, the patient has consented to  Procedure(s): LOWER EXTREMITY ANGIOGRAM (POSSIBLE INTERVENTION, POSSIBLE OPEN REVISION OF BYPASS GRAFT) (Right) ENDARTERECTOMY FEMORAL (Right) as a surgical intervention.  The patient's history has been reviewed, patient examined, no change in status, stable for surgery.  I have reviewed the patient's chart and labs.  Questions were answered to the patient's satisfaction.     Festus Barren

## 2020-01-07 NOTE — Transfer of Care (Signed)
Immediate Anesthesia Transfer of Care Note  Patient: Travis Salas  Procedure(s) Performed: LOWER EXTREMITY ANGIOGRAM  insertion of infusion catheter  (Right Groin) CENTRAL LINE INSERTION (Left Neck) INSERTION OF ILIAC STENT (Right Groin) ABDOMINAL AORTAGRAM (N/A )  Patient Location: PACU  Anesthesia Type:General  Level of Consciousness: drowsy and patient cooperative  Airway & Oxygen Therapy: Patient Spontanous Breathing and Patient connected to face mask oxygen  Post-op Assessment: Report given to RN and Post -op Vital signs reviewed and stable  Post vital signs: Reviewed and stable  Last Vitals:  Vitals Value Taken Time  BP 164/79 01/07/20 1907  Temp 36.2 C 01/07/20 1906  Pulse 64 01/07/20 1915  Resp 16 01/07/20 1906  SpO2 100 % 01/07/20 1915  Vitals shown include unvalidated device data.  Last Pain:  Vitals:   01/07/20 1906  PainSc: Asleep         Complications: No complications documented.

## 2020-01-07 NOTE — Consult Note (Signed)
ANTICOAGULATION CONSULT NOTE   Pharmacy Consult for Heparin  Indication: VTE Treatment  Allergies  Allergen Reactions  . Hydrocodone Swelling    Patient Measurements: Weight: 70.8 kg (156 lb) Heparin Dosing Weight: 70.8 kg  Vital Signs: Temp: 97.1 F (36.2 C) (08/25 1413) BP: 156/80 (08/25 1413) Pulse Rate: 76 (08/25 1413)  Labs: Recent Labs    01/07/20 1343  HGB 13.8  HCT 39.7  PLT 179  APTT 32  LABPROT 15.1  INR 1.2  CREATININE 1.00    Estimated Creatinine Clearance: 69.8 mL/min (by C-G formula based on SCr of 1 mg/dL).   Medical History: Past Medical History:  Diagnosis Date  . Collagen vascular disease (HCC)   . Hyperlipidemia   . Hypertension   . Peripheral vascular disease (HCC)     Medications:  Medications Prior to Admission  Medication Sig Dispense Refill Last Dose  . ciclopirox (PENLAC) 8 % solution APPLY OVER NAIL AND SURROUNDING SKIN DAILY AT BEDTIME OVER PREVIOUS COAT. AFTER 7 DAYS, MAY REMOVE WITH ALCOHOL AND CONTINUE CYCLE 6.6 mL 0 01/06/2020 at Unknown time  . ELIQUIS 5 MG TABS tablet TAKE 1 TABLET(5 MG) BY MOUTH TWICE DAILY 60 tablet 5 01/06/2020 at Unknown time  . furosemide (LASIX) 20 MG tablet TK 1 T PO QD PRN.  2 01/06/2020 at Unknown time  . gabapentin (NEURONTIN) 300 MG capsule TAKE 1 CAPSULE BY MOUTH AT BEDTIME 60 capsule 0 01/06/2020 at Unknown time  . Multiple Vitamin (MULTIVITAMIN WITH MINERALS) TABS tablet Take 1 tablet by mouth every evening.    01/06/2020 at Unknown time  . Omega-3 Fatty Acids (FISH OIL) 1200 MG CAPS Take 2,400 mg by mouth 2 (two) times daily.   01/06/2020 at Unknown time  . rosuvastatin (CRESTOR) 20 MG tablet Take 20 mg by mouth daily.   01/06/2020 at Unknown time  . sertraline (ZOLOFT) 100 MG tablet TK 1 T PO QD  3 01/06/2020 at Unknown time  . Specialty Vitamins Products (ONE-A-DAY PROSTATE PO) Take 1 tablet by mouth daily. "Super Beta Prostate"    01/06/2020 at Unknown time  . tamsulosin (FLOMAX) 0.4 MG CAPS capsule  Take 0.4 mg by mouth daily.   01/06/2020 at Unknown time  . Zn-Pyg Afri-Nettle-Saw Palmet (SAW PALMETTO COMPLEX PO) Take 2 tablets by mouth daily.    01/06/2020 at Unknown time   Scheduled:  . chlorhexidine      . [MAR Hold] gabapentin  300 mg Oral QHS  . [START ON 01/08/2020] rosuvastatin  20 mg Oral Daily  . sodium chloride flush  3 mL Intravenous Q12H  . [START ON 01/08/2020] tamsulosin  0.4 mg Oral Daily   Infusions:  . sodium chloride    . [START ON 01/08/2020] sodium chloride    . alteplase (LIMB ISCHEMIA) 10 mg in normal saline (0.02 mg/mL) infusion    . ceFAZolin    . heparin     PRN: sodium chloride, [MAR Hold] acetaminophen **OR** [MAR Hold] acetaminophen, [MAR Hold]  morphine injection, [MAR Hold] ondansetron **OR** [MAR Hold] ondansetron (ZOFRAN) IV, [MAR Hold] oxyCODONE, sodium chloride flush Anti-infectives (From admission, onward)   Start     Dose/Rate Route Frequency Ordered Stop   01/08/20 0600  ceFAZolin (ANCEF) IVPB 2g/100 mL premix  Status:  Discontinued        2 g 200 mL/hr over 30 Minutes Intravenous On call to O.R. 01/07/20 1336 01/07/20 1728   01/07/20 1457  ceFAZolin (ANCEF) 2-4 GM/100ML-% IVPB       Note to  Pharmacy: Rayann Heman   : cabinet override      01/07/20 1457 01/08/20 0259      Assessment: Pharmacy consulted to start heparin at 600 units/hr.  Goal of Therapy:  Maintain heparin level 0.2 - 0.5 units.mL. Monitor platelets by anticoagulation protocol: Yes   Plan:  Start heparin infusion at 600 units/hr Check anti-Xa level in 6 hours and daily while on heparin Continue to monitor H&H and platelets  Ronnald Ramp, PharmD, BCPS 01/07/2020,5:34 PM

## 2020-01-07 NOTE — Op Note (Signed)
[facility]  Percutaneous Study/Intervention Procedural Note   Date of Surgery: 01/07/2020  Surgeon(s):Keeva Reisen    Assistants:none  Pre-operative Diagnosis: PAD with rest pain RLE, acute on chronic ischemia  Post-operative diagnosis:  Same  Procedure(s) Performed:             1.  Ultrasound guidance for vascular access left femoral artery             2.  Catheter placement into right anterior tibial artery from left femoral approach             3.  Aortogram and selective right lower extremity angiogram             4.   Life star stent placement to the right external iliac artery with 8 mm diameter by 8 cm length stent postdilated with a 7 mm diameter Lutonix drug-coated balloon             5.   Placement of infusion catheter for continuous thrombolytic therapy throughout the right femoral posterior tibial bypass and into the second jump graft bypass into the proximal anterior tibial artery with the infusion of 6 mg of TPA today                Assistant: Dr. Levora Dredge  EBL: 10 cc  Contrast: 40 cc  Fluoro Time: 6 minutes  Anesthesia: General              Indications:  Patient is a 64 y.o.male with an ischemic right foot after previous interventions with an occluded with a pair of bypass grafts in the right leg. The patient has noninvasive study showing occlusion of his bypass grafts. The patient is brought in for angiography for further evaluation and potential treatment.  Due to the limb threatening nature of the situation, angiogram was performed for attempted limb salvage. The patient is aware that if the procedure fails, amputation would be expected.  The patient also understands that even with successful revascularization, amputation may still be required due to the severity of the situation. Risks and benefits are discussed and informed consent is obtained.  Dr. Gilda Crease scrubbed and assisted to help to the complexity of the case as well as to be  present if surgical revision was necessary.  Procedure:  The patient was identified and appropriate procedural time out was performed.  The patient was then placed supine on the table and prepped and draped in the usual sterile fashion. Moderate conscious sedation was administered during a face to face encounter with the patient throughout the procedure with my supervision of the RN administering medicines and monitoring the patient's vital signs, pulse oximetry, telemetry and mental status throughout from the start of the procedure until the patient was taken to the recovery room. Ultrasound was used to evaluate the left common femoral artery.  It was patent .  A digital ultrasound image was acquired.  A Seldinger needle was used to access the left common femoral artery under direct ultrasound guidance and a permanent image was performed.  A 0.035 J wire was advanced without resistance and a 5Fr sheath was placed.  Pigtail catheter was placed into the aorta and an AP aortogram was performed. This demonstrated normal renal arteries and normal aorta and iliac segments without significant stenosis in the left side but there was 60 to 70% stenosis in the right external iliac artery just above the previously placed stent. I then crossed the aortic bifurcation and advanced to the  right femoral head. Selective right lower extremity angiogram was then performed. The common femoral artery appeared to be patent and the profunda femoris artery had good flow.  The native SFA and the previous stents were all occluded.  The bypass graft was occluded after a short stump.  There is very poor distal reconstitution.  After advancing a Kumpe catheter into the bypass graft and down into the distal portion of the femoral to peroneal trunk bypass, imaging was performed which showed a lot of thrombus within the graft.  There was a peroneal artery which did appear to provide some runoff distally.  When you there is an additional bypass  jumped over to the anterior tibial artery was able to get into that without difficulty with an advantage wire and a Kumpe catheter.  The Kumpe catheter was advanced down to the proximal anterior tibial arteries below the bypass which was found to have a significant amount of thrombus present. It was felt that it was in the patient's best interest to proceed with intervention after these images to avoid a second procedure and a larger amount of contrast and fluoroscopy based off of the findings from the initial angiogram.  The occluded bypass would have to be managed with thrombolytic therapy due to the large volume of thrombus.  The inflow lesion would be treated today though.  The patient was systemically heparinized and a 6 [place] destination sheath was then placed over the [employer] wire. I then elected an 8 mm diameter by 8 cm length life star stent in the right external iliac artery up to the iliac bifurcation and down to the previously placed stent.  This was deployed and then postdilated with a 7 mm diameter Lutonix drug-coated angioplasty balloon with less than 10% residual stenosis.  I then selected a 135 cm total length 50 cm working length thrombolytic catheter.  This is placed over the wire and parked in the proximal anterior tibial artery distally into the proximal portion of the bypass graft just below the common femoral artery proximally.  6 mg of TPA was then instilled through the thrombolytic catheter and a continuous infusion for thrombolytic therapy will be run overnight. I elected to terminate the procedure. The sheath and lytic catheter were secured into place with silk sutures.  The patient was taken to the recovery room in stable condition having tolerated the procedure well.  Findings:               Aortogram:   This demonstrated normal renal arteries and normal aorta and iliac segments without significant stenosis in the left side but there was 60 to 70% stenosis in the right  external iliac artery just above the previously placed stent.             Right lower Extremity:  The common femoral artery appeared to be patent and the profunda femoris artery had good flow.  The native SFA and the previous stents were all occluded.  The bypass graft was occluded after a short stump.  There is very poor distal reconstitution.  After advancing a Kumpe catheter into the bypass graft and down into the distal portion of the femoral to peroneal trunk bypass, imaging was performed which showed a lot of thrombus within the graft.  There was a peroneal artery which did appear to provide some runoff distally.  When you there is an additional bypass jumped over to the anterior tibial artery was able to get into that without difficulty with an  advantage wire and a Kumpe catheter.  The Kumpe catheter was advanced down to the proximal anterior tibial arteries below the bypass which was found to have a significant amount of thrombus present.   Disposition: Patient was taken to the recovery room in stable condition having tolerated the procedure well.  Complications: None  Festus Barren 01/07/2020 6:56 PM   This note was created with Dragon Medical transcription system. Any errors in dictation are purely unintentional.

## 2020-01-07 NOTE — H&P (View-Only) (Signed)
Subjective:    Patient ID: Travis Salas, male    DOB: 1956-05-06, 64 y.o.   MRN: [id number] Chief Complaint  Patient presents with  . Follow-up    ultrasound follow up    The patient returns to the office for followup and review of the noninvasive studies.  The patient was previously seen on 12/29/2019 following an ischemic leg event.  At that time the patient had normal blood flow to his right lower extremity.  However this weekend, there has been a significant deterioration in the lower extremity symptoms.  The patient notes interval shortening of their claudication distance and development of mild rest pain symptoms. No new ulcers or wounds have occurred since the last visit.  There have been no significant changes to the patient's overall health care.  The patient denies amaurosis fugax or recent TIA symptoms. There are no recent neurological changes noted. The patient denies history of DVT, PE or superficial thrombophlebitis. The patient denies recent episodes of angina or shortness of breath.   ABI's Rt=0.13 and Lt=0.60 (previous ABI's Rt=1.10 and Lt=0.72) Duplex US of the  lower extremity arterial system shows no flow adequately detected in the right SFA, popliteal artery or bypass graft.  There is minimal flow noted at the distal lower leg region.  The left lower extremity has monophasic tibial artery waveforms.   Review of Systems  Cardiovascular:       Rest pain of right lower extremity  Neurological: Positive for numbness.  All other systems reviewed and are negative.      Objective:   Physical Exam Vitals reviewed.  HENT:     Head: Normocephalic.  Cardiovascular:     Rate and Rhythm: Normal rate.     Pulses:          Dorsalis pedis pulses are 0 on the right side.       Posterior tibial pulses are 0 on the right side.  Pulmonary:     Effort: Pulmonary effort is normal.  Musculoskeletal:        General: Tenderness present.  Neurological:     Mental Status:  He is alert and oriented to person, place, and time.     Motor: Weakness present.  Psychiatric:        Mood and Affect: Mood normal.        Behavior: Behavior normal.        Thought Content: Thought content normal.        Judgment: Judgment normal.     BP 134/81 (BP Location: Right Arm)   Pulse 76   Resp 16   Wt 156 lb (70.8 kg)   BMI 24.43 kg/m   Past Medical History:  Diagnosis Date  . Collagen vascular disease (HCC)   . Hyperlipidemia   . Hypertension   . Peripheral vascular disease (HCC)     Social History   Socioeconomic History  . Marital status: Married    Spouse name: Selena Batten  . Number of children: Not on file  . Years of education: Not on file  . Highest education level: Not on file  Occupational History  . Not on file  Tobacco Use  . Smoking status: Former Smoker    Packs/day: 1.50    Years: 46.00    Pack years: 69.00    Types: Cigarettes    Quit date: 02/05/2015    Years since quitting: 4.9  . Smokeless tobacco: Never Used  Vaping Use  . Vaping Use: Never used  Substance  and Sexual Activity  . Alcohol use: Yes    Alcohol/week: 7.0 standard drinks    Types: 7 Cans of beer per week  . Drug use: Yes    Types: Marijuana    Comment: occasional  . Sexual activity: Not on file  Other Topics Concern  . Not on file  Social History Narrative   Lives at home with wife in private residence   Social Determinants of Health   Financial Resource Strain:   . Difficulty of Paying Living Expenses: Not on file  Food Insecurity:   . Worried About Programme researcher, broadcasting/film/video in the Last Year: Not on file  . Ran Out of Food in the Last Year: Not on file  Transportation Needs:   . Lack of Transportation (Medical): Not on file  . Lack of Transportation (Non-Medical): Not on file  Physical Activity:   . Days of Exercise per Week: Not on file  . Minutes of Exercise per Session: Not on file  Stress:   . Feeling of Stress : Not on file  Social Connections:   . Frequency of  Communication with Friends and Family: Not on file  . Frequency of Social Gatherings with Friends and Family: Not on file  . Attends Religious Services: Not on file  . Active Member of Clubs or Organizations: Not on file  . Attends Banker Meetings: Not on file  . Marital Status: Not on file  Intimate Partner Violence:   . Fear of Current or Ex-Partner: Not on file  . Emotionally Abused: Not on file  . Physically Abused: Not on file  . Sexually Abused: Not on file    Past Surgical History:  Procedure Laterality Date  . BYPASS GRAFT ANGIOGRAPHY Right 11/21/2019   Procedure: BYPASS GRAFT ANGIOGRAPHY;  Surgeon: Griffith Citron, MD;  Location: [facility];  Service: Cardiovascular;  Laterality: Right;  . ENDARTERECTOMY FEMORAL Right 12/23/2015   Procedure: COMMON, FEMORAL, SUPERFICIAL, AND PROFUNDA ENDARTERECTOMY;  Surgeon: Annice Needy, MD;  Location: [facility];  Service: Vascular;  Laterality: Right;  . FEMORAL-TIBIAL BYPASS GRAFT Right 12/23/2015   Procedure: BYPASS GRAFT FEMORAL-TIBIAL ARTERY, COMMON FEMORAL AND PROFUNDIS FEMORAL WITH PATCH ANGIOPLASTY, POPLITEAL ANTERIOR ARTERY BYPASS, ILIOFEMORAL ANGIOGRAM VIABHAM STENT 8 MM DIAMETER X 5 CM LENGTH;  Surgeon: Annice Needy, MD;  Location: [facility];  Service: Vascular;  Laterality: Right;  . LOWER EXTREMITY ANGIOGRAPHY Right 06/19/2019   Procedure: LOWER EXTREMITY ANGIOGRAPHY;  Surgeon: Annice Needy, MD;  Location: [facility];  Service: Cardiovascular;  Laterality: Right;  . LOWER EXTREMITY ANGIOGRAPHY Right 06/20/2019   Procedure: Lower Extremity Angiography;  Surgeon: Annice Needy, MD;  Location: [facility];  Service: Cardiovascular;  Laterality: Right;  . PERIPHERAL VASCULAR CATHETERIZATION Right 11/08/2015   Procedure: Lower Extremity Angiography;  Surgeon: Annice Needy, MD;  Location: [facility];  Service: Cardiovascular;  Laterality: Right;  . PERIPHERAL VASCULAR CATHETERIZATION Right 12/13/2015    Procedure: Lower Extremity Angiography;  Surgeon: Annice Needy, MD;  Location: [facility];  Service: Cardiovascular;  Laterality: Right;  . PERIPHERAL VASCULAR THROMBECTOMY Right 11/21/2019   Procedure: PERIPHERAL VASCULAR THROMBECTOMY;  Surgeon: Griffith Citron, MD;  Location: [facility];  Service: Cardiovascular;  Laterality: Right;  . PERIPHERAL VASCULAR THROMBECTOMY Right 11/22/2019   Procedure: PERIPHERAL VASCULAR THROMBECTOMY;  Surgeon: Griffith Citron, MD;  Location: [facility];  Service: Cardiovascular;  Laterality: Right;    Family History  Problem  Relation Age of Onset  . Diabetes Mother   . Hypertension Brother     Allergies  Allergen Reactions  . Hydrocodone Swelling       Assessment & Plan:   1. Hyperlipidemia, unspecified hyperlipidemia type Continue statin as ordered and reviewed, no changes at this time   2. Essential hypertension Continue antihypertensive medications as already ordered, these medications have been reviewed and there are no changes at this time.   3. Ischemic leg   Recommend:  The patient has evidence of severe atherosclerotic changes of the right lower extremity associated with tissue loss of the foot.  This represents a limb threatening ischemia and places the patient at the risk for limb loss.  Patient should undergo angiography of the lower extremities with the hope for intervention for limb salvage. Previous angiogram has also indicated that surgical intervention may be necessary to repair his bypass graft.  Possible revision of the bypass graft may be necessary. The risks and benefits as well as the alternative therapies was discussed in detail with the patient.  All questions were answered.  Patient agrees to proceed with angiography.  The patient will follow up with me in the office after the procedure.      Current Outpatient Medications on File Prior to Visit  Medication Sig Dispense Refill  . ciclopirox  (PENLAC) 8 % solution APPLY OVER NAIL AND SURROUNDING SKIN DAILY AT BEDTIME OVER PREVIOUS COAT. AFTER 7 DAYS, MAY REMOVE WITH ALCOHOL AND CONTINUE CYCLE 6.6 mL 0  . ELIQUIS 5 MG TABS tablet TAKE 1 TABLET(5 MG) BY MOUTH TWICE DAILY 60 tablet 5  . furosemide (LASIX) 20 MG tablet TK 1 T PO QD PRN.  2  . gabapentin (NEURONTIN) 300 MG capsule TAKE 1 CAPSULE BY MOUTH AT BEDTIME 60 capsule 0  . Omega-3 Fatty Acids (FISH OIL) 1200 MG CAPS Take 2,400 mg by mouth 2 (two) times daily.    . rosuvastatin (CRESTOR) 20 MG tablet Take 20 mg by mouth daily.    . sertraline (ZOLOFT) 100 MG tablet TK 1 T PO QD  3  . tamsulosin (FLOMAX) 0.4 MG CAPS capsule Take 0.4 mg by mouth daily.    . Multiple Vitamin (MULTIVITAMIN WITH MINERALS) TABS tablet Take 1 tablet by mouth every evening. (Patient not taking: Reported on 12/29/2019)    . Specialty Vitamins Products (ONE-A-DAY PROSTATE PO) Take 1 tablet by mouth daily. "Super Beta Prostate" (Patient not taking: Reported on 12/29/2019)    . Zn-Pyg Afri-Nettle-Saw Palmet (SAW PALMETTO COMPLEX PO) Take 2 tablets by mouth daily. (Patient not taking: Reported on 12/29/2019)     No current facility-administered medications on file prior to visit.    There are no Patient Instructions on file for this visit. No follow-ups on file.   Aliegha Paullin E Volney Reierson, NP   

## 2020-01-07 NOTE — Anesthesia Postprocedure Evaluation (Signed)
Anesthesia Post Note  Patient: Travis Salas  Procedure(s) Performed: LOWER EXTREMITY ANGIOGRAM  insertion of infusion catheter  (Right Groin) CENTRAL LINE INSERTION (Left Neck) INSERTION OF ILIAC STENT (Right Groin) ABDOMINAL AORTAGRAM (N/A )  Patient location during evaluation: PACU Anesthesia Type: General Level of consciousness: awake and alert Pain management: pain level controlled Vital Signs Assessment: post-procedure vital signs reviewed and stable Respiratory status: spontaneous breathing and respiratory function stable Cardiovascular status: stable Anesthetic complications: no   No complications documented.   Last Vitals:  Vitals:   01/07/20 1906 01/07/20 1921  BP: (!) 164/79 (!) 161/77  Pulse: 74 66  Resp: 16 16  Temp: (!) 36.2 C   SpO2: 100% 100%    Last Pain:  Vitals:   01/07/20 1906  PainSc: Asleep                 [name]

## 2020-01-07 NOTE — Anesthesia Preprocedure Evaluation (Signed)
Anesthesia Evaluation  Patient identified by MRN, date of birth, ID band Patient awake    Reviewed: Allergy & Precautions, NPO status , Patient's Chart, lab work & pertinent test results  History of Anesthesia Complications Negative for: history of anesthetic complications  Airway Mallampati: III       Dental  (+) Upper Dentures, Lower Dentures   Pulmonary neg sleep apnea, neg COPD, Not current smoker, former smoker,           Cardiovascular hypertension, Pt. on medications + Peripheral Vascular Disease  (-) Past MI and (-) CHF (-) dysrhythmias (-) Valvular Problems/Murmurs     Neuro/Psych neg Seizures    GI/Hepatic neg GERD  ,(+)     substance abuse  marijuana use,   Endo/Other  neg diabetes  Renal/GU negative Renal ROS     Musculoskeletal   Abdominal   Peds  Hematology   Anesthesia Other Findings   Reproductive/Obstetrics                             Anesthesia Physical Anesthesia Plan  ASA: III  Anesthesia Plan: General   Post-op Pain Management:    Induction: Intravenous  PONV Risk Score and Plan: 2 and Ondansetron and Treatment may vary due to age or medical condition  Airway Management Planned: Oral ETT  Additional Equipment:   Intra-op Plan:   Post-operative Plan:   Informed Consent: I have reviewed the patients History and Physical, chart, labs and discussed the procedure including the risks, benefits and alternatives for the proposed anesthesia with the patient or authorized representative who has indicated his/her understanding and acceptance.       Plan Discussed with:   Anesthesia Plan Comments:         Anesthesia Quick Evaluation

## 2020-01-08 ENCOUNTER — Inpatient Hospital Stay: Payer: BC Managed Care – PPO | Admitting: Certified Registered Nurse Anesthetist

## 2020-01-08 ENCOUNTER — Encounter
Admission: EM | Disposition: A | Discharge status: Home / Self Care | Payer: Self-pay | Source: Ambulatory Visit | Attending: Vascular Surgery

## 2020-01-08 ENCOUNTER — Encounter: Payer: Self-pay | Admitting: Vascular Surgery

## 2020-01-08 HISTORY — PX: LOWER EXTREMITY ANGIOGRAPHY: CATH118251

## 2020-01-08 LAB — BPAM RBC
Blood Product Expiration Date: 202109232359
Blood Product Expiration Date: 202109232359
Unit Type and Rh: 7300
Unit Type and Rh: 7300

## 2020-01-08 LAB — TYPE AND SCREEN
ABO/RH(D): B POS
Antibody Screen: NEGATIVE
Unit division: 0
Unit division: 0

## 2020-01-08 LAB — CBC
HCT: 38 % — ABNORMAL LOW (ref 39.0–52.0)
HCT: 34.8 % — ABNORMAL LOW (ref 39.0–52.0)
Hemoglobin: 12.7 g/dL — ABNORMAL LOW (ref 13.0–17.0)
Hemoglobin: 12.3 g/dL — ABNORMAL LOW (ref 13.0–17.0)
MCH: 31.6 pg (ref 26.0–34.0)
MCH: 32.1 pg (ref 26.0–34.0)
MCHC: 33.4 g/dL (ref 30.0–36.0)
MCHC: 35.3 g/dL (ref 30.0–36.0)
MCV: 94.5 fL (ref 80.0–100.0)
MCV: 90.9 fL (ref 80.0–100.0)
Platelets: 148 10*3/uL — ABNORMAL LOW (ref 150–400)
Platelets: 141 10*3/uL — ABNORMAL LOW (ref 150–400)
RBC: 4.02 MIL/uL — ABNORMAL LOW (ref 4.22–5.81)
RBC: 3.83 MIL/uL — ABNORMAL LOW (ref 4.22–5.81)
RDW: 12.9 % (ref 11.5–15.5)
RDW: 12.9 % (ref 11.5–15.5)
WBC: 8.2 10*3/uL (ref 4.0–10.5)
WBC: 9.6 10*3/uL (ref 4.0–10.5)
nRBC: 0 % (ref 0.0–0.2)
nRBC: 0 % (ref 0.0–0.2)

## 2020-01-08 LAB — MAGNESIUM: Magnesium: 2.1 mg/dL (ref 1.7–2.4)

## 2020-01-08 LAB — BASIC METABOLIC PANEL
Anion gap: 10 (ref 5–15)
BUN: 14 mg/dL (ref 8–23)
CO2: 28 mmol/L (ref 22–32)
Calcium: 8.9 mg/dL (ref 8.9–10.3)
Chloride: 103 mmol/L (ref 98–111)
Creatinine, Ser: 0.87 mg/dL (ref 0.61–1.24)
GFR calc Af Amer: >60 mL/min (ref >60)
GFR calc non Af Amer: >60 mL/min (ref >60)
Glucose, Bld: 136 mg/dL — ABNORMAL HIGH (ref 70–99)
Potassium: 4.1 mmol/L (ref 3.5–5.1)
Sodium: 141 mmol/L (ref 135–145)

## 2020-01-08 LAB — PREPARE RBC (CROSSMATCH)

## 2020-01-08 LAB — FIBRINOGEN
Fibrinogen: 342 mg/dL (ref 210–475)
Fibrinogen: 193 mg/dL — ABNORMAL LOW (ref 210–475)

## 2020-01-08 LAB — HEPARIN LEVEL (UNFRACTIONATED): Heparin Unfractionated: 1.16 IU/mL — ABNORMAL HIGH (ref 0.30–0.70)

## 2020-01-08 LAB — APTT: aPTT: 32 seconds (ref 24–36)

## 2020-01-08 SURGERY — LOWER EXTREMITY ANGIOGRAPHY
Anesthesia: General | Laterality: Right

## 2020-01-08 MED ORDER — FENTANYL CITRATE (PF) 100 MCG/2ML IJ SOLN
INTRAMUSCULAR | Status: AC
  Administered 2020-01-08: 25 ug via INTRAVENOUS
  Filled 2020-01-08: qty 2

## 2020-01-08 MED ORDER — CEFAZOLIN SODIUM-DEXTROSE 2-3 GM-%(50ML) IV SOLR
INTRAVENOUS | Status: DC | PRN
  Administered 2020-01-08: 2 g via INTRAVENOUS

## 2020-01-08 MED ORDER — ONDANSETRON HCL 4 MG/2ML IJ SOLN
INTRAMUSCULAR | Status: DC | PRN
  Administered 2020-01-08: 4 mg via INTRAVENOUS

## 2020-01-08 MED ORDER — ONDANSETRON HCL 4 MG/2ML IJ SOLN: 4.0000 mg | Freq: Once | INTRAMUSCULAR | Status: DC | PRN

## 2020-01-08 MED ORDER — FENTANYL CITRATE (PF) 100 MCG/2ML IJ SOLN: 25.0000 ug | INTRAMUSCULAR | Status: DC | PRN

## 2020-01-08 MED ORDER — EPHEDRINE SULFATE 50 MG/ML IJ SOLN
INTRAMUSCULAR | Status: DC | PRN
  Administered 2020-01-08: 5 mg via INTRAVENOUS
  Administered 2020-01-08 (×3): 10 mg via INTRAVENOUS
  Administered 2020-01-08: 5 mg via INTRAVENOUS

## 2020-01-08 MED ORDER — HEPARIN SODIUM (PORCINE) 1000 UNIT/ML IJ SOLN
INTRAMUSCULAR | Status: AC
  Filled 2020-01-08: qty 1

## 2020-01-08 MED ORDER — DEXAMETHASONE SODIUM PHOSPHATE 10 MG/ML IJ SOLN
INTRAMUSCULAR | Status: DC | PRN
  Administered 2020-01-08: 10 mg via INTRAVENOUS

## 2020-01-08 MED ORDER — HEPARIN (PORCINE) 25000 UT/250ML-% IV SOLN
500.0000 [IU]/h | INTRAVENOUS | Status: DC
  Administered 2020-01-08: 400 [IU]/h via INTRAVENOUS

## 2020-01-08 MED ORDER — PROPOFOL 10 MG/ML IV BOLUS
INTRAVENOUS | Status: AC
  Filled 2020-01-08: qty 20

## 2020-01-08 MED ORDER — TIROFIBAN (AGGRASTAT) BOLUS VIA INFUSION
25.0000 ug/kg | Freq: Once | INTRAVENOUS | Status: AC
  Administered 2020-01-08: 1767.5 ug via INTRAVENOUS
  Filled 2020-01-08: qty 36

## 2020-01-08 MED ORDER — FENTANYL CITRATE (PF) 100 MCG/2ML IJ SOLN
INTRAMUSCULAR | Status: AC
  Filled 2020-01-08: qty 2

## 2020-01-08 MED ORDER — ONDANSETRON HCL 4 MG/2ML IJ SOLN
INTRAMUSCULAR | Status: AC
  Filled 2020-01-08: qty 2

## 2020-01-08 MED ORDER — LIDOCAINE HCL (PF) 2 % IJ SOLN
INTRAMUSCULAR | Status: AC
  Filled 2020-01-08: qty 5

## 2020-01-08 MED ORDER — TIROFIBAN HCL IN NACL 5-0.9 MG/100ML-% IV SOLN
0.1500 ug/kg/min | INTRAVENOUS | Status: DC
  Administered 2020-01-08 – 2020-01-09 (×3): 0.15 ug/kg/min via INTRAVENOUS
  Filled 2020-01-08 (×4): qty 100

## 2020-01-08 MED ORDER — HEPARIN SODIUM (PORCINE) 1000 UNIT/ML IJ SOLN
INTRAMUSCULAR | Status: DC | PRN
  Administered 2020-01-08: 3000 [IU] via INTRAVENOUS

## 2020-01-08 MED ORDER — PROPOFOL 10 MG/ML IV BOLUS
INTRAVENOUS | Status: DC | PRN
  Administered 2020-01-08: 50 mg via INTRAVENOUS
  Administered 2020-01-08: 150 mg via INTRAVENOUS

## 2020-01-08 MED ORDER — DEXAMETHASONE SODIUM PHOSPHATE 10 MG/ML IJ SOLN
INTRAMUSCULAR | Status: AC
  Filled 2020-01-08: qty 1

## 2020-01-08 MED ORDER — LIDOCAINE HCL (CARDIAC) PF 100 MG/5ML IV SOSY
PREFILLED_SYRINGE | INTRAVENOUS | Status: DC | PRN
  Administered 2020-01-08: 80 mg via INTRAVENOUS

## 2020-01-08 MED ORDER — FENTANYL CITRATE (PF) 100 MCG/2ML IJ SOLN
INTRAMUSCULAR | Status: DC | PRN
Start: 2020-01-08 — End: 2020-01-08
  Administered 2020-01-08 (×4): 25 ug via INTRAVENOUS

## 2020-01-08 MED ORDER — IODIXANOL 320 MG/ML IV SOLN
INTRAVENOUS | Status: DC | PRN
  Administered 2020-01-08: 40 mL via INTRA_ARTERIAL

## 2020-01-08 MED ORDER — MIDAZOLAM HCL 2 MG/2ML IJ SOLN
INTRAMUSCULAR | Status: AC
  Filled 2020-01-08: qty 2

## 2020-01-08 MED ORDER — MIDAZOLAM HCL 2 MG/2ML IJ SOLN
INTRAMUSCULAR | Status: DC | PRN
  Administered 2020-01-08: 2 mg via INTRAVENOUS

## 2020-01-08 SURGICAL SUPPLY — 11 items
CANISTER PENUMBRA ENGINE (MISCELLANEOUS) ×3 IMPLANT
CATH INDIGO 6 ST-TIP 135CM (CATHETERS) ×3 IMPLANT
CATH INDIGO CAT6 KIT (CATHETERS) ×3 IMPLANT
DEVICE SAFEGUARD 24CM (GAUZE/BANDAGES/DRESSINGS) ×3 IMPLANT
DEVICE STARCLOSE SE CLOSURE (Vascular Products) ×3 IMPLANT
KIT ENCORE 26 ADVANTAGE (KITS) IMPLANT
PACK ANGIOGRAPHY (CUSTOM PROCEDURE TRAY) ×3 IMPLANT
SHEATH FLEXOR ANSEL2 7FRX45 (SHEATH) ×3 IMPLANT
STENT VIABAHN 7X7.5X120 (Permanent Stent) ×3 IMPLANT
WIRE G V18X300CM (WIRE) ×3 IMPLANT
WIRE J 3MM .035X145CM (WIRE) ×3 IMPLANT

## 2020-01-08 NOTE — Consult Note (Signed)
ANTICOAGULATION CONSULT NOTE   Pharmacy Consult for Heparin  Indication: VTE Treatment  Allergies  Allergen Reactions  . Hydrocodone Swelling    Patient Measurements: Height: 5\' 7"  (170.2 cm) Weight: 70.7 kg (155 lb 13.8 oz) IBW/kg (Calculated) : 66.1 Heparin Dosing Weight: 70.8 kg  Vital Signs: Temp: 98.3 F (36.8 C) (08/26 0730) Temp Source: Oral (08/26 0730) BP: 130/72 (08/26 0730) Pulse Rate: 59 (08/26 0730)  Labs: Recent Labs    01/07/20 1343 01/07/20 1343 01/07/20 2109 01/07/20 2131 01/08/20 0335 01/08/20 1007  HGB 13.8   < > 12.9*  --  12.7*  --   HCT 39.7  --  37.8*  --  38.0*  --   PLT 179  --  145*  --  148*  --   APTT 32  --   --   --   --  32  LABPROT 15.1  --   --   --   --   --   INR 1.2  --   --   --   --   --   HEPARINUNFRC  --   --   --  2.06*  --   --   CREATININE 1.00  --   --   --  0.87  --    < > = values in this interval not displayed.    Estimated Creatinine Clearance: 80.2 mL/min (by C-G formula based on SCr of 0.87 mg/dL).   Medical History: Past Medical History:  Diagnosis Date  . Collagen vascular disease (HCC)   . Hyperlipidemia   . Hypertension   . Peripheral vascular disease [facility])      Assessment: 64 year old male started on heparin as well as tPA to dwell in the right lower extremity. Patient was started on heparin at 600 units/hr with lower goal HL of 0.2-0.5 units/ml. It appears that patient is on Eliquis PTA with last dose 8/24.     Goal of Therapy:  APTT ~ 50-80 Maintain heparin level 0.2 - 0.5 units.mL. Monitor platelets by anticoagulation protocol: Yes   Plan:  Heparin previously held and rate lowered for HL 2.06 ~ 2100. HL likely elevated in setting of recent Eliquis use so will need to adjust heparin based on APTT. Will aim for APTT ~ 50-80 to correlate with lower goal HL.  1007 APTT 32. Will increase heparin drip back to 500 units/hr and recheck APTT at 1700. CBC with morning labs.  2101,  PharmD 01/08/2020,12:01 PM

## 2020-01-08 NOTE — Consult Note (Signed)
ANTICOAGULATION CONSULT NOTE   Pharmacy Consult for Heparin  Indication: VTE Treatment  Allergies  Allergen Reactions  . Hydrocodone Swelling    Patient Measurements: Height: 5\' 7"  (170.2 cm) Weight: 70.7 kg (155 lb 13.8 oz) IBW/kg (Calculated) : 66.1 Heparin Dosing Weight: 70.8 kg  Vital Signs: Temp: 98.2 F (36.8 C) (08/26 0000) Temp Source: Oral (08/26 0100) BP: 141/73 (08/26 0100) Pulse Rate: 64 (08/26 0100)  Labs: Recent Labs    01/07/20 1343 01/07/20 2109 01/07/20 2131  HGB 13.8 12.9*  --   HCT 39.7 37.8*  --   PLT 179 145*  --   APTT 32  --   --   LABPROT 15.1  --   --   INR 1.2  --   --   HEPARINUNFRC  --   --  2.06*  CREATININE 1.00  --   --     Estimated Creatinine Clearance: 69.8 mL/min (by C-G formula based on SCr of 1 mg/dL).   Medical History: Past Medical History:  Diagnosis Date  . Collagen vascular disease (HCC)   . Hyperlipidemia   . Hypertension   . Peripheral vascular disease (HCC)     Medications:  Medications Prior to Admission  Medication Sig Dispense Refill Last Dose  . ciclopirox (PENLAC) 8 % solution APPLY OVER NAIL AND SURROUNDING SKIN DAILY AT BEDTIME OVER PREVIOUS COAT. AFTER 7 DAYS, MAY REMOVE WITH ALCOHOL AND CONTINUE CYCLE 6.6 mL 0 01/06/2020 at Unknown time  . ELIQUIS 5 MG TABS tablet TAKE 1 TABLET(5 MG) BY MOUTH TWICE DAILY 60 tablet 5 01/06/2020 at Unknown time  . furosemide (LASIX) 20 MG tablet TK 1 T PO QD PRN.  2 01/06/2020 at Unknown time  . gabapentin (NEURONTIN) 300 MG capsule TAKE 1 CAPSULE BY MOUTH AT BEDTIME 60 capsule 0 01/06/2020 at Unknown time  . Multiple Vitamin (MULTIVITAMIN WITH MINERALS) TABS tablet Take 1 tablet by mouth every evening.    01/06/2020 at Unknown time  . Omega-3 Fatty Acids (FISH OIL) 1200 MG CAPS Take 2,400 mg by mouth 2 (two) times daily.   01/06/2020 at Unknown time  . rosuvastatin (CRESTOR) 20 MG tablet Take 20 mg by mouth daily.   01/06/2020 at Unknown time  . sertraline (ZOLOFT) 100 MG  tablet TK 1 T PO QD  3 01/06/2020 at Unknown time  . Specialty Vitamins Products (ONE-A-DAY PROSTATE PO) Take 1 tablet by mouth daily. "Super Beta Prostate"    01/06/2020 at Unknown time  . tamsulosin (FLOMAX) 0.4 MG CAPS capsule Take 0.4 mg by mouth daily.   01/06/2020 at Unknown time  . Zn-Pyg Afri-Nettle-Saw Palmet (SAW PALMETTO COMPLEX PO) Take 2 tablets by mouth daily.    01/06/2020 at Unknown time   Scheduled:  . chlorhexidine      . Chlorhexidine Gluconate Cloth  6 each Topical Daily  . gabapentin  300 mg Oral QHS  . rosuvastatin  20 mg Oral Daily  . sodium chloride flush  3 mL Intravenous Q12H  . tamsulosin  0.4 mg Oral Daily   Infusions:  . sodium chloride    . sodium chloride 75 mL/hr at 01/08/20 0020  . alteplase (LIMB ISCHEMIA) 10 mg in normal saline (0.02 mg/mL) infusion 0.5 mg/hr (01/08/20 0129)  . ceFAZolin    . heparin     PRN: sodium chloride, acetaminophen **OR** acetaminophen, morphine injection, ondansetron **OR** ondansetron (ZOFRAN) IV, oxyCODONE, sodium chloride flush Anti-infectives (From admission, onward)   Start     Dose/Rate Route Frequency Ordered Stop  01/08/20 0600  ceFAZolin (ANCEF) IVPB 2g/100 mL premix  Status:  Discontinued        2 g 200 mL/hr over 30 Minutes Intravenous On call to O.R. 01/07/20 1336 01/07/20 1728   01/07/20 1457  ceFAZolin (ANCEF) 2-4 GM/100ML-% IVPB       Note to Pharmacy: Rayann Heman   : cabinet override      01/07/20 1457 01/08/20 0259      Assessment: Pharmacy consulted to start heparin at 600 units/hr.  Goal of Therapy:  Maintain heparin level 0.2 - 0.5 units.mL. Monitor platelets by anticoagulation protocol: Yes   Plan:  08/25 @ 2131 HL 2.02 supratherapeutic. Holding drip until 0400 and plan to restart at 400 units/hr and recheck HL at 1000, per RN patient not having bleeding issues, but CBC did trend down slightly will continue to monitor.  Thomasene Ripple, PharmD, BCPS 01/08/2020,2:27 AM

## 2020-01-08 NOTE — Transfer of Care (Signed)
Immediate Anesthesia Transfer of Care Note  Patient: Travis Salas  Procedure(s) Performed: Lower Extremity Angiography (Right )  Patient Location: PACU  Anesthesia Type:General  Level of Consciousness: drowsy  Airway & Oxygen Therapy: Patient Spontanous Breathing and Patient connected to face mask oxygen  Post-op Assessment: Report given to RN and Post -op Vital signs reviewed and stable  Post vital signs: Reviewed and stable  Last Vitals:  Vitals Value Taken Time  BP 134/66 01/08/20 1528  Temp    Pulse 69 01/08/20 1536  Resp 11 01/08/20 1536  SpO2 100 % 01/08/20 1536  Vitals shown include unvalidated device data.  Last Pain:  Vitals:   01/08/20 1346  TempSrc: Oral  PainSc: 0-No pain      Patients Stated Pain Goal: 0 (01/08/20 1200)  Complications: No complications documented.

## 2020-01-08 NOTE — Anesthesia Procedure Notes (Signed)
Procedure Name: LMA Insertion Date/Time: 01/08/2020 2:26 PM Performed by: Rosanne Gutting, CRNA Pre-anesthesia Checklist: Patient identified, Patient being monitored, Timeout performed, Emergency Drugs available and Suction available Patient Re-evaluated:Patient Re-evaluated prior to induction Oxygen Delivery Method: Circle system utilized Preoxygenation: Pre-oxygenation with 100% oxygen Induction Type: IV induction Ventilation: Mask ventilation without difficulty LMA: LMA inserted LMA Size: 4.5 Tube type: Oral Number of attempts: 1 Placement Confirmation: positive ETCO2 and breath sounds checked- equal and bilateral Tube secured with: Tape Dental Injury: Teeth and Oropharynx as per pre-operative assessment

## 2020-01-08 NOTE — Interval H&P Note (Signed)
History and Physical Interval Note:  01/08/2020 2:01 PM  Travis Salas  has presented today for surgery, with the diagnosis of Right Lower Extremity Ischemia.  The various methods of treatment have been discussed with the patient and family. After consideration of risks, benefits and other options for treatment, the patient has consented to  Procedure(s): Lower Extremity Angiography (Right) as a surgical intervention.  The patient's history has been reviewed, patient examined, no change in status, stable for surgery.  I have reviewed the patient's chart and labs.  Questions were answered to the patient's satisfaction.     Festus Barren

## 2020-01-08 NOTE — Anesthesia Preprocedure Evaluation (Signed)
Anesthesia Evaluation  Patient identified by MRN, date of birth, ID band Patient awake    Reviewed: Allergy & Precautions, H&P , NPO status , Patient's Chart, lab work & pertinent test results  History of Anesthesia Complications Negative for: history of anesthetic complications  Airway Mallampati: II  TM Distance: >3 FB     Dental  (+) Implants, Edentulous Upper, Edentulous Lower   Pulmonary neg sleep apnea, neg COPD, former smoker,    breath sounds clear to auscultation       Cardiovascular hypertension, (-) angina+ Peripheral Vascular Disease  (-) Past MI and (-) Cardiac Stents (-) dysrhythmias  Rhythm:regular Rate:Normal     Neuro/Psych negative neurological ROS  negative psych ROS   GI/Hepatic negative GI ROS, Neg liver ROS,   Endo/Other  negative endocrine ROS  Renal/GU      Musculoskeletal   Abdominal   Peds  Hematology H/o DVT, PVD, on Eliquis   Anesthesia Other Findings Past Medical History: No date: Collagen vascular disease (HCC) No date: Hyperlipidemia No date: Hypertension No date: Peripheral vascular disease (HCC)  Past Surgical History: 11/21/2019: BYPASS GRAFT ANGIOGRAPHY; Right     Comment:  Procedure: BYPASS GRAFT ANGIOGRAPHY;  Surgeon: Griffith Citron, MD;  Location: [facility];  Service:               Cardiovascular;  Laterality: Right; 01/07/2020: CENTRAL LINE INSERTION; Left     Comment:  Procedure: CENTRAL LINE INSERTION;  Surgeon: Annice Needy, MD;  Location: [facility];  Service: Vascular;                Laterality: Left; 12/23/2015: ENDARTERECTOMY FEMORAL; Right     Comment:  Procedure: COMMON, FEMORAL, SUPERFICIAL, AND PROFUNDA               ENDARTERECTOMY;  Surgeon: Annice Needy, MD;  Location:               [facility];  Service: Vascular;  Laterality: Right; 12/23/2015: FEMORAL-TIBIAL BYPASS GRAFT; Right     Comment:  Procedure: BYPASS GRAFT  FEMORAL-TIBIAL ARTERY, COMMON               FEMORAL AND PROFUNDIS FEMORAL WITH PATCH ANGIOPLASTY,               POPLITEAL ANTERIOR ARTERY BYPASS, ILIOFEMORAL ANGIOGRAM               VIABHAM STENT 8 MM DIAMETER X 5 CM LENGTH;  Surgeon:               Annice Needy, MD;  Location: [facility];  Service: Vascular;              Laterality: Right; 01/07/2020: INSERTION OF ILIAC STENT; Right     Comment:  Procedure: INSERTION OF ILIAC STENT;  Surgeon: Annice Needy, MD;  Location: [facility];  Service: Vascular;                Laterality: Right; 01/07/2020: LOWER EXTREMITY ANGIOGRAM; Right     Comment:  Procedure: LOWER EXTREMITY ANGIOGRAM  insertion of               infusion catheter ;  Surgeon: Annice Needy, MD;  Location: [facility];  Service: Vascular;  Laterality:               Right; 06/19/2019: LOWER EXTREMITY ANGIOGRAPHY; Right     Comment:  Procedure: LOWER EXTREMITY ANGIOGRAPHY;  Surgeon: Annice Needy, MD;  Location: [facility];  Service:               Cardiovascular;  Laterality: Right; 06/20/2019: LOWER EXTREMITY ANGIOGRAPHY; Right     Comment:  Procedure: Lower Extremity Angiography;  Surgeon: Annice Needy, MD;  Location: [facility];  Service:               Cardiovascular;  Laterality: Right; 11/08/2015: PERIPHERAL VASCULAR CATHETERIZATION; Right     Comment:  Procedure: Lower Extremity Angiography;  Surgeon: Annice Needy, MD;  Location: [facility];  Service:               Cardiovascular;  Laterality: Right; 12/13/2015: PERIPHERAL VASCULAR CATHETERIZATION; Right     Comment:  Procedure: Lower Extremity Angiography;  Surgeon: Annice Needy, MD;  Location: [facility];  Service:               Cardiovascular;  Laterality: Right; 11/21/2019: PERIPHERAL VASCULAR THROMBECTOMY; Right     Comment:  Procedure: PERIPHERAL VASCULAR THROMBECTOMY;  Surgeon:               Griffith Citron, MD;   Location: [facility];                Service: Cardiovascular;  Laterality: Right; 11/22/2019: PERIPHERAL VASCULAR THROMBECTOMY; Right     Comment:  Procedure: PERIPHERAL VASCULAR THROMBECTOMY;  Surgeon:               Griffith Citron, MD;  Location: [facility];                Service: Cardiovascular;  Laterality: Right;  BMI    Body Mass Index: 24.41 kg/m      Reproductive/Obstetrics negative OB ROS                             Anesthesia Physical Anesthesia Plan  ASA: III  Anesthesia Plan: General LMA   Post-op Pain Management:    Induction:   PONV Risk Score and Plan: Dexamethasone, Ondansetron, Midazolam and Treatment may vary due to age or medical condition  Airway Management Planned:   Additional Equipment:   Intra-op Plan:   Post-operative Plan:   Informed Consent: I have reviewed the patients History and Physical, chart, labs and discussed the procedure including the risks, benefits and alternatives for the proposed anesthesia with the patient or authorized representative who has indicated his/her understanding and acceptance.     Dental Advisory Given  Plan Discussed with: Anesthesiologist, CRNA and Surgeon  Anesthesia Plan Comments:         Anesthesia Quick Evaluation

## 2020-01-08 NOTE — Op Note (Signed)
[facility]  Percutaneous Study/Intervention Procedural Note   Date of Surgery: 01/08/2020  Surgeon(s):Fox Salminen    Assistants:none  Pre-operative Diagnosis: PAD with rest pain right lower extremity, status post overnight thrombolytic therapy  Post-operative diagnosis:  Same  Procedure(s) Performed:             1.   Right lower extremity angiogram             2.   Mechanical thrombectomy to the right femoral to tibioperoneal trunk bypass with the penumbra cat 6 device             3.   Mechanical thrombectomy to the right tibioperoneal trunk to anterior tibial bypass in the anterior tibial artery with the penumbra cat 6 device             4.   Viabahn covered stent placement to the right femoral to tibioperoneal trunk bypass in the proximal segment with a 7 mm diameter by 7.5 cm length stent             5.   StarClose closure device left femoral artery  EBL: 100 cc  Contrast: 40 cc  Fluoro Time: 4.6 minutes  Anesthesia: General              Indications:  Patient is a 64 y.o.male with limb threatening ischemia of the right leg with acute on chronic ischemia with rest pain.  He has been running TPA overnight through a continuous thrombolytic catheter. The patient is brought in for angiography for further evaluation and potential treatment.  Due to the limb threatening nature of the situation, angiogram was performed for attempted limb salvage. The patient is aware that if the procedure fails, amputation would be expected.  The patient also understands that even with successful revascularization, amputation may still be required due to the severity of the situation.  Risks and benefits are discussed and informed consent is obtained.   Procedure:  The patient was identified and appropriate procedural time out was performed.  The patient was then placed supine on the table and prepped and draped in the usual sterile fashion. Moderate conscious sedation was  administered during a face to face encounter with the patient throughout the procedure with my supervision of the RN administering medicines and monitoring the patient's vital signs, pulse oximetry, telemetry and mental status throughout from the start of the procedure until the patient was taken to the recovery room.  The existing thrombolytic catheter was removed over a V 18 wire which was parked in the distal anterior tibial artery.  Selective right lower extremity angiogram was then performed through the sheath. This demonstrated The proximal bypass graft did have flow but there was a moderate amount of thrombus present in the proximal segment. It was felt that it was in the patient's best interest to proceed with intervention after these images to avoid a second procedure and a larger amount of contrast and fluoroscopy based off of the findings from the initial angiogram. The patient was systemically heparinized and a 7 [place] Ansell sheath was then placed over the V18 wire with removal of the destination sheath. I then brought the penumbra cat 6 device onto the field and perform mechanical thrombectomy of the proximal portion of the femoral to tibioperoneal trunk bypass and what would equate to the proximal SFA.  2 passes were made but a small amount of residual thrombus was present at the top of the previously placed stent and just above  the stent.  I elected to cover this area with a 7 mm diameter by 7.5 cm length Viabahn stent.  This was deployed and not postdilated to try to avoid pushing any thrombus out.  Completion imaging now showed this area to be widely patent with no significant residual stenosis and no visible thrombus.  The remainder of this bypass down to the distal anastomosis was widely patent.  The jump graft from the tibioperoneal trunk to the anterior tibial artery however was occluded.  The tibioperoneal trunk and the posterior tibial artery were continuous in the foot.  I then brought the  penumbra cat 6 device down into the tibioperoneal trunk to anterior tibial bypass and into the anterior tibial artery.  Another pass was made with thrombus removed.  Completion imaging following this showed this bypass to now be open with some spasm below the area treated but no significant residual stenosis or thrombus present.  The posterior tibial artery also remain patent.  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  The sheath was removed and StarClose closure device was deployed in the left femoral artery with excellent hemostatic result. The patient was taken to the recovery room in stable condition having tolerated the procedure well.  Findings:                            Right Lower Extremity:  The proximal bypass graft did have flow but there was a moderate amount of thrombus present in the proximal segment.   Disposition: Patient was taken to the recovery room in stable condition having tolerated the procedure well.  Complications: None  Festus Barren 01/08/2020 3:19 PM   This note was created with Dragon Medical transcription system. Any errors in dictation are purely unintentional.

## 2020-01-09 ENCOUNTER — Encounter: Payer: Self-pay | Admitting: Vascular Surgery

## 2020-01-09 DIAGNOSIS — Z452 Encounter for adjustment and management of vascular access device: Secondary | ICD-10-CM

## 2020-01-09 LAB — BASIC METABOLIC PANEL
Anion gap: 8 (ref 5–15)
BUN: 22 mg/dL (ref 8–23)
CO2: 26 mmol/L (ref 22–32)
Calcium: 8.5 mg/dL — ABNORMAL LOW (ref 8.9–10.3)
Chloride: 106 mmol/L (ref 98–111)
Creatinine, Ser: 0.85 mg/dL (ref 0.61–1.24)
GFR calc Af Amer: >60 mL/min (ref >60)
GFR calc non Af Amer: >60 mL/min (ref >60)
Glucose, Bld: 124 mg/dL — ABNORMAL HIGH (ref 70–99)
Potassium: 4.1 mmol/L (ref 3.5–5.1)
Sodium: 140 mmol/L (ref 135–145)

## 2020-01-09 LAB — CBC
HCT: 31.6 % — ABNORMAL LOW (ref 39.0–52.0)
Hemoglobin: 10.4 g/dL — ABNORMAL LOW (ref 13.0–17.0)
MCH: 31.2 pg (ref 26.0–34.0)
MCHC: 32.9 g/dL (ref 30.0–36.0)
MCV: 94.9 fL (ref 80.0–100.0)
Platelets: 131 10*3/uL — ABNORMAL LOW (ref 150–400)
RBC: 3.33 MIL/uL — ABNORMAL LOW (ref 4.22–5.81)
RDW: 13 % (ref 11.5–15.5)
WBC: 8.7 10*3/uL (ref 4.0–10.5)
nRBC: 0 % (ref 0.0–0.2)

## 2020-01-09 LAB — MAGNESIUM: Magnesium: 2.3 mg/dL (ref 1.7–2.4)

## 2020-01-09 MED ORDER — APIXABAN 5 MG PO TABS: 5.0000 mg | ORAL_TABLET | Freq: Two times a day (BID) | ORAL | 3 refills | Status: DC

## 2020-01-09 MED ORDER — OXYCODONE HCL 5 MG PO TABS
5.0000 mg | ORAL_TABLET | Freq: Four times a day (QID) | ORAL | 0 refills | Status: AC | PRN
Start: 2020-01-09 — End: ?

## 2020-01-09 MED ORDER — ASPIRIN EC 81 MG PO TBEC: 81.0000 mg | DELAYED_RELEASE_TABLET | Freq: Every day | ORAL | 3 refills | Status: AC

## 2020-01-09 NOTE — Anesthesia Postprocedure Evaluation (Signed)
Anesthesia Post Note  Patient: Travis Salas  Procedure(s) Performed: Lower Extremity Angiography (Right )  Patient location during evaluation: PACU Anesthesia Type: General Level of consciousness: awake and alert Pain management: pain level controlled Vital Signs Assessment: post-procedure vital signs reviewed and stable Respiratory status: spontaneous breathing, nonlabored ventilation and respiratory function stable Cardiovascular status: blood pressure returned to baseline and stable Postop Assessment: no apparent nausea or vomiting Anesthetic complications: no   No complications documented.   Last Vitals:  Vitals:   01/09/20 0447 01/09/20 0832  BP: 118/68 120/70  Pulse: (!) 58 (!) 58  Resp: 20 19  Temp: 36.9 C 36.6 C  SpO2: 96% 99%    Last Pain:  Vitals:   01/09/20 1000  TempSrc:   PainSc: 0-No pain                 Aurelio Brash Aashvi Rezabek

## 2020-01-09 NOTE — Progress Notes (Signed)
Foley has been discontinued and removed

## 2020-01-09 NOTE — Discharge Instructions (Signed)
Vascular Surgery Discharge Instructions:  1) you may shower.  Please keep your groin clean and dry.  Gently wash with soap and water and gently pat dry. 2) please do not engage in strenuous activity or lifting greater than 10 pounds for at least 2 weeks. 3) no drinking or driving if you are taking pain medication

## 2020-01-09 NOTE — Plan of Care (Signed)

## 2020-01-09 NOTE — Progress Notes (Signed)
Patients wife at bedside. Wife and patient educated on discharge instructions. Patient stated he does not want the oxy for pain. This RN stated he does not have to pick it up at the pharmacy. Patient verbalized understanding on importance of keeping left femoral site clean and dry. PAD removed.

## 2020-01-09 NOTE — Discharge Summary (Signed)
[facility]    Discharge Summary  Patient ID:  Travis Salas MRN: [id number] DOB/AGE: 1956/01/14 64 y.o.  Admit date: 01/07/2020 Discharge date: 01/09/2020 Date of Surgery: 01/08/2020 Surgeon: Surgeon(s): Wyn Quaker Marlow Baars, MD  Admission Diagnosis: Ischemia of extremity [I99.8]  Discharge Diagnoses:  Ischemia of extremity [I99.8]  Secondary Diagnoses: Past Medical History:  Diagnosis Date  . Collagen vascular disease (HCC)   . Hyperlipidemia   . Hypertension   . Peripheral vascular disease (HCC)    Procedure(s): (01/07/20) 1. Left IJ triple lumen central venous catheter placement 2. Left IJ cannulation under ultrasound guidance  1. Ultrasound guidance for vascular access left femoral artery 2. Catheter placement into right anterior tibial artery from left femoral approach 3. Aortogram and selective right lower extremity angiogram 4.  Life star stent placement to the right external iliac artery with 8 mm diameter by 8 cm length stent postdilated with a 7 mm diameter Lutonix drug-coated balloon 5.  Placement of infusion catheter for continuous thrombolytic therapy throughout the right femoral posterior tibial bypass and into the second jump graft bypass into the proximal anterior tibial artery with the infusion of 6 mg of TPA today  (01/08/20) 1.  Right lower extremity angiogram 2.  Mechanical thrombectomy to the right femoral to tibioperoneal trunk bypass with the penumbra cat 6 device 3.  Mechanical thrombectomy to the right tibioperoneal trunk to anterior tibial bypass in the anterior tibial artery with the penumbra cat 6 device 4.  Viabahn covered stent placement to the right femoral to tibioperoneal trunk bypass in the proximal segment with a 7 mm diameter by 7.5 cm length stent 5.  StarClose closure device left femoral  artery  Discharged Condition: Good  HPI / Hospital Course:  Patient is a 64 y.o.male with an ischemic right foot after previous interventions with an occluded with a pair of bypass grafts in the right leg. The patient has noninvasive study showing occlusion of his bypass grafts. The patient is brought in for angiography for further evaluation and potential treatment.  Due to the limb threatening nature of the situation, angiogram was performed for attempted limb salvage. The patient is aware that if the procedure fails, amputation would be expected.  The patient also understands that even with successful revascularization, amputation may still be required due to the severity of the situation. Risks and benefits are discussed and informed consent is obtained. On 01/03/20 the patient underwent:  1. Ultrasound guidance for vascular access left femoral artery 2. Catheter placement into right anterior tibial artery from left femoral approach 3. Aortogram and selective right lower extremity angiogram 4.  Life star stent placement to the right external iliac artery with 8 mm diameter by 8 cm length stent postdilated with a 7 mm diameter Lutonix drug-coated balloon 5.  Placement of infusion catheter for continuous thrombolytic therapy throughout the right femoral posterior tibial bypass and into the second jump graft bypass into the proximal anterior tibial artery with the infusion of 6 mg of TPA today  He tolerated the procedure well was transferred from the recovery room to the ICU for heparin/TPA infusion overnight.  HIDA procedure was unremarkable.  The patient returned to the angio suite the following day and underwent:  1.  Right lower extremity angiogram 2.  Mechanical thrombectomy to the right femoral to tibioperoneal trunk bypass with the penumbra cat 6 device 3.  Mechanical thrombectomy to the  right tibioperoneal trunk to anterior tibial bypass in the anterior tibial artery with  the penumbra cat 6 device 4.  Viabahn covered stent placement to the right femoral to tibioperoneal trunk bypass in the proximal segment with a 7 mm diameter by 7.5 cm length stent 5.  StarClose closure device left femoral artery  The patient taught the procedure well was transferred to the progressive cardiac unit for observation overnight.  Patient was treated with Aggrastat which was transitioned to Eliquis and aspirin the following morning.  Night of procedure was unremarkable.    During the patient's stay, his diet was advanced, his Foley was removed and he was urinating independently, his pain was controlled with the use of p.o. pain medication and he was ambulating at baseline.  Day of discharge, the patient was afebrile with stable vital signs and improvement in his physical exam.  Physical exam:  Alert and oriented x3, no acute distress Cardiovascular: Regular rate and rhythm Pulmonary: Clear to auscultation bilaterally Abdomen: Soft, nontender, nondistended positive bowel sounds Groin access site: Healing well.  No signs of ecchymosis, drainage or swelling Right lower extremity: Thigh soft.  Calf soft.  Extremities warm distally toes.  Good capillary refill.  Minimal edema.  Motor/sensory is intact.  Labs: As below  Complications: None  Consults: None  Significant Diagnostic Studies: CBC Lab Results  Component Value Date   WBC 8.7 01/09/2020   HGB 10.4 (L) 01/09/2020   HCT 31.6 (L) 01/09/2020   MCV 94.9 01/09/2020   PLT 131 (L) 01/09/2020   BMET    Component Value Date/Time   NA 140 01/09/2020 0508   K 4.1 01/09/2020 0508   CL 106 01/09/2020 0508   CO2 26 01/09/2020 0508   GLUCOSE 124 (H) 01/09/2020 0508   BUN 22 01/09/2020 0508   CREATININE 0.85 01/09/2020 0508   CALCIUM 8.5 (L) 01/09/2020 0508   GFRNONAA >60 01/09/2020 0508   GFRAA >60 01/09/2020  0508   COAG Lab Results  Component Value Date   INR 1.2 01/07/2020   INR 1.3 (H) 11/22/2019   INR 1.0 11/21/2019   Disposition:  Discharge to :Home  Allergies as of 01/09/2020      Reactions   Hydrocodone Swelling      Medication List    TAKE these medications   apixaban 5 MG Tabs tablet Commonly known as: Eliquis Take 1 tablet (5 mg total) by mouth 2 (two) times daily. What changed: See the new instructions.   aspirin EC 81 MG tablet Take 1 tablet (81 mg total) by mouth daily. Swallow whole.   ciclopirox 8 % solution Commonly known as: PENLAC APPLY OVER NAIL AND SURROUNDING SKIN DAILY AT BEDTIME OVER PREVIOUS COAT. AFTER 7 DAYS, MAY REMOVE WITH ALCOHOL AND CONTINUE CYCLE   Fish Oil 1200 MG Caps Take 2,400 mg by mouth 2 (two) times daily.   furosemide 20 MG tablet Commonly known as: LASIX TK 1 T PO QD PRN.   gabapentin 300 MG capsule Commonly known as: NEURONTIN TAKE 1 CAPSULE BY MOUTH AT BEDTIME   multivitamin with minerals Tabs tablet Take 1 tablet by mouth every evening.   ONE-A-DAY PROSTATE PO Take 1 tablet by mouth daily. "Super Beta Prostate"   oxyCODONE 5 MG immediate release tablet Commonly known as: Oxy IR/ROXICODONE Take 1 tablet (5 mg total) by mouth every 6 (six) hours as needed for severe pain.   rosuvastatin 20 MG tablet Commonly known as: CRESTOR Take 20 mg by mouth daily.   SAW PALMETTO COMPLEX PO Take 2 tablets by mouth daily.   sertraline 100 MG  tablet Commonly known as: ZOLOFT TK 1 T PO QD   tamsulosin 0.4 MG Caps capsule Commonly known as: FLOMAX Take 0.4 mg by mouth daily.      Verbal and written Discharge instructions given to the patient. Wound care per Discharge AVS  Follow-up Information    Annice Needy, MD On 02/09/2020.   Specialties: Vascular Surgery, Radiology, Interventional Cardiology Why: Can see Dew or Vivia Birmingham.  Will need ABI with visit...Marland KitchenMarland KitchenAppointment at 2:00pm Contact information: [address] [place]  [place] [address] [phone]              Signed: Tonette Lederer, PA-C  01/09/2020, 12:12 PM

## 2020-01-27 ENCOUNTER — Other Ambulatory Visit (INDEPENDENT_AMBULATORY_CARE_PROVIDER_SITE_OTHER): Payer: Self-pay | Admitting: Vascular Surgery

## 2020-01-27 DIAGNOSIS — Z95820 Peripheral vascular angioplasty status with implants and grafts: Secondary | ICD-10-CM

## 2020-01-27 DIAGNOSIS — I739 Peripheral vascular disease, unspecified: Secondary | ICD-10-CM

## 2020-01-30 ENCOUNTER — Other Ambulatory Visit: Payer: Self-pay

## 2020-01-30 ENCOUNTER — Ambulatory Visit (INDEPENDENT_AMBULATORY_CARE_PROVIDER_SITE_OTHER): Payer: BC Managed Care – PPO | Admitting: Nurse Practitioner

## 2020-01-30 ENCOUNTER — Ambulatory Visit (INDEPENDENT_AMBULATORY_CARE_PROVIDER_SITE_OTHER): Payer: BC Managed Care – PPO

## 2020-01-30 ENCOUNTER — Encounter (INDEPENDENT_AMBULATORY_CARE_PROVIDER_SITE_OTHER): Payer: Self-pay | Admitting: Nurse Practitioner

## 2020-01-30 VITALS — BP 109/75 | HR 76 | Resp 16 | Wt 159.8 lb

## 2020-01-30 DIAGNOSIS — I70213 Atherosclerosis of native arteries of extremities with intermittent claudication, bilateral legs: Secondary | ICD-10-CM | POA: Diagnosis not present

## 2020-01-30 DIAGNOSIS — Z95820 Peripheral vascular angioplasty status with implants and grafts: Secondary | ICD-10-CM

## 2020-01-30 DIAGNOSIS — E785 Hyperlipidemia, unspecified: Secondary | ICD-10-CM | POA: Diagnosis not present

## 2020-01-30 DIAGNOSIS — I739 Peripheral vascular disease, unspecified: Secondary | ICD-10-CM

## 2020-01-30 DIAGNOSIS — I10 Essential (primary) hypertension: Secondary | ICD-10-CM | POA: Diagnosis not present

## 2020-02-02 ENCOUNTER — Telehealth (INDEPENDENT_AMBULATORY_CARE_PROVIDER_SITE_OTHER): Payer: Self-pay | Admitting: Vascular Surgery

## 2020-02-02 NOTE — Telephone Encounter (Signed)
I called the Rx in to [facility].

## 2020-02-02 NOTE — Telephone Encounter (Signed)
Wife called stating when Travis Salas went to pick up his RX from [facility] in [place], they were out of stock of Eliquis. Wife wants to know if we could call in RX to [facility] [place]. Patient was last seen 01-30-20 with ABI studies with FB.

## 2020-02-05 ENCOUNTER — Encounter (INDEPENDENT_AMBULATORY_CARE_PROVIDER_SITE_OTHER): Payer: Self-pay | Admitting: Nurse Practitioner

## 2020-02-05 NOTE — Progress Notes (Signed)
Subjective:    Patient ID: Travis Salas, male    DOB: 1955/12/01, 64 y.o.   MRN: [id number] Chief Complaint  Patient presents with  . Follow-up    [facility] 29month post le angio    The patient returns to the office for followup and review status post angiogram with intervention. The patient notes improvement in the lower extremity symptoms. No interval shortening of the patient's claudication distance or rest pain symptoms. Previous wounds have now healed.  No new ulcers or wounds have occurred since the last visit.   The patient recently underwent intervention for ischemic right lower extremity, on 01/07/2020 he underwent:   Procedure(s) Performed: 1. Ultrasound guidance for vascular access left femoral artery 2. Catheter placement into right anterior tibial artery from left femoral approach 3. Aortogram and selective right lower extremity angiogram 4.  Life star stent placement to the right external iliac artery with 8 mm diameter by 8 cm length stent postdilated with a 7 mm diameter Lutonix drug-coated balloon 5.  Placement of infusion catheter for continuous thrombolytic therapy throughout the right femoral posterior tibial bypass and into the second jump graft bypass into the proximal anterior tibial artery with the infusion of 6 mg of TPA today   Subsequently on 8/26 he underwent:  Procedure(s) Performed: 1.  Right lower extremity angiogram 2.  Mechanical thrombectomy to the right femoral to tibioperoneal trunk bypass with the penumbra cat 6 device 3.  Mechanical thrombectomy to the right tibioperoneal trunk to anterior tibial bypass in the anterior tibial artery with the penumbra cat 6 device 4.  Viabahn covered stent placement to the right femoral to tibioperoneal trunk bypass in the proximal segment with a 7 mm diameter by 7.5 cm length stent 5.   StarClose closure device left femoral artery   There have been no significant changes to the patient's overall health care.  The patient denies amaurosis fugax or recent TIA symptoms. There are no recent neurological changes noted. The patient denies history of DVT, PE or superficial thrombophlebitis. The patient denies recent episodes of angina or shortness of breath.   ABI's Rt=1.05 and Lt=0.50  (previous ABI's Rt=0.13 and Lt=0.60) Duplex US of the right lower extremity reveals triphasic waveforms with good toe waveforms while the left lower extremity has monophasic waveforms with dampened toe waveforms.   Review of Systems  Cardiovascular:       Claudication  All other systems reviewed and are negative.      Objective:   Physical Exam Vitals reviewed.  HENT:     Head: Normocephalic.  Cardiovascular:     Rate and Rhythm: Normal rate and regular rhythm.     Pulses: Normal pulses.  Pulmonary:     Effort: Pulmonary effort is normal.  Skin:    General: Skin is warm and dry.  Neurological:     Mental Status: He is alert and oriented to person, place, and time.  Psychiatric:        Mood and Affect: Mood normal.        Behavior: Behavior normal.        Thought Content: Thought content normal.        Judgment: Judgment normal.     BP 109/75 (BP Location: Right Arm)   Pulse 76   Resp 16   Wt 159 lb 12.8 oz (72.5 kg)   BMI 25.03 kg/m   Past Medical History:  Diagnosis Date  . Collagen vascular disease (HCC)   . Hyperlipidemia   .  Hypertension   . Peripheral vascular disease (HCC)     Social History   Socioeconomic History  . Marital status: Married    Spouse name: Selena Batten  . Number of children: Not on file  . Years of education: Not on file  . Highest education level: Not on file  Occupational History  . Not on file  Tobacco Use  . Smoking status: Former Smoker    Packs/day: 1.50    Years: 46.00    Pack years: 69.00    Types: Cigarettes    Quit date:  02/05/2015    Years since quitting: 5.0  . Smokeless tobacco: Never Used  Vaping Use  . Vaping Use: Never used  Substance and Sexual Activity  . Alcohol use: Yes    Alcohol/week: 7.0 standard drinks    Types: 7 Cans of beer per week  . Drug use: Yes    Types: Marijuana    Comment: occasional  . Sexual activity: Not on file  Other Topics Concern  . Not on file  Social History Narrative   Lives at home with wife in private residence   Social Determinants of Health   Financial Resource Strain:   . Difficulty of Paying Living Expenses: Not on file  Food Insecurity:   . Worried About Programme researcher, broadcasting/film/video in the Last Year: Not on file  . Ran Out of Food in the Last Year: Not on file  Transportation Needs:   . Lack of Transportation (Medical): Not on file  . Lack of Transportation (Non-Medical): Not on file  Physical Activity:   . Days of Exercise per Week: Not on file  . Minutes of Exercise per Session: Not on file  Stress:   . Feeling of Stress : Not on file  Social Connections:   . Frequency of Communication with Friends and Family: Not on file  . Frequency of Social Gatherings with Friends and Family: Not on file  . Attends Religious Services: Not on file  . Active Member of Clubs or Organizations: Not on file  . Attends Banker Meetings: Not on file  . Marital Status: Not on file  Intimate Partner Violence:   . Fear of Current or Ex-Partner: Not on file  . Emotionally Abused: Not on file  . Physically Abused: Not on file  . Sexually Abused: Not on file    Past Surgical History:  Procedure Laterality Date  . BYPASS GRAFT ANGIOGRAPHY Right 11/21/2019   Procedure: BYPASS GRAFT ANGIOGRAPHY;  Surgeon: Griffith Citron, MD;  Location: [facility];  Service: Cardiovascular;  Laterality: Right;  . CENTRAL LINE INSERTION Left 01/07/2020   Procedure: CENTRAL LINE INSERTION;  Surgeon: Annice Needy, MD;  Location: [facility];  Service: Vascular;  Laterality: Left;  .  ENDARTERECTOMY FEMORAL Right 12/23/2015   Procedure: COMMON, FEMORAL, SUPERFICIAL, AND PROFUNDA ENDARTERECTOMY;  Surgeon: Annice Needy, MD;  Location: [facility];  Service: Vascular;  Laterality: Right;  . FEMORAL-TIBIAL BYPASS GRAFT Right 12/23/2015   Procedure: BYPASS GRAFT FEMORAL-TIBIAL ARTERY, COMMON FEMORAL AND PROFUNDIS FEMORAL WITH PATCH ANGIOPLASTY, POPLITEAL ANTERIOR ARTERY BYPASS, ILIOFEMORAL ANGIOGRAM VIABHAM STENT 8 MM DIAMETER X 5 CM LENGTH;  Surgeon: Annice Needy, MD;  Location: [facility];  Service: Vascular;  Laterality: Right;  . INSERTION OF ILIAC STENT Right 01/07/2020   Procedure: INSERTION OF ILIAC STENT;  Surgeon: Annice Needy, MD;  Location: [facility];  Service: Vascular;  Laterality: Right;  . LOWER EXTREMITY ANGIOGRAM Right 01/07/2020   Procedure:  LOWER EXTREMITY ANGIOGRAM  insertion of infusion catheter ;  Surgeon: Annice Needyew, Jason S, MD;  Location: [facility];  Service: Vascular;  Laterality: Right;  . LOWER EXTREMITY ANGIOGRAPHY Right 06/19/2019   Procedure: LOWER EXTREMITY ANGIOGRAPHY;  Surgeon: Annice Needyew, Jason S, MD;  Location: [facility];  Service: Cardiovascular;  Laterality: Right;  . LOWER EXTREMITY ANGIOGRAPHY Right 06/20/2019   Procedure: Lower Extremity Angiography;  Surgeon: Annice Needyew, Jason S, MD;  Location: [facility];  Service: Cardiovascular;  Laterality: Right;  . LOWER EXTREMITY ANGIOGRAPHY Right 01/08/2020   Procedure: Lower Extremity Angiography;  Surgeon: Annice Needyew, Jason S, MD;  Location: [facility];  Service: Cardiovascular;  Laterality: Right;  . PERIPHERAL VASCULAR CATHETERIZATION Right 11/08/2015   Procedure: Lower Extremity Angiography;  Surgeon: Annice NeedyJason S Dew, MD;  Location: [facility];  Service: Cardiovascular;  Laterality: Right;  . PERIPHERAL VASCULAR CATHETERIZATION Right 12/13/2015   Procedure: Lower Extremity Angiography;  Surgeon: Annice NeedyJason S Dew, MD;  Location: [facility];  Service: Cardiovascular;  Laterality: Right;  . PERIPHERAL  VASCULAR THROMBECTOMY Right 11/21/2019   Procedure: PERIPHERAL VASCULAR THROMBECTOMY;  Surgeon: Griffith CitronArbid, Elias, MD;  Location: [facility];  Service: Cardiovascular;  Laterality: Right;  . PERIPHERAL VASCULAR THROMBECTOMY Right 11/22/2019   Procedure: PERIPHERAL VASCULAR THROMBECTOMY;  Surgeon: Griffith CitronArbid, Elias, MD;  Location: [facility];  Service: Cardiovascular;  Laterality: Right;    Family History  Problem Relation Age of Onset  . Diabetes Mother   . Hypertension Brother     Allergies  Allergen Reactions  . Hydrocodone Swelling       Assessment & Plan:   1. Atherosclerosis of native artery of both lower extremities with intermittent claudication (HCC)  Recommend:  The patient has evidence of atherosclerosis of the lower extremities with claudication.  The patient does not voice lifestyle limiting changes at this point in time.  Noninvasive studies do not suggest clinically significant change.  No invasive studies, angiography or surgery at this time The patient should continue walking and begin a more formal exercise program.  The patient should continue antiplatelet therapy and aggressive treatment of the lipid abnormalities  No changes in the patient's medications at this time  The patient should continue wearing graduated compression socks 10-15 mmHg strength to control the mild edema.   Patient will return to the office in 3 months with noninvasive studies, sooner if issues should arise. 2. Hyperlipidemia, unspecified hyperlipidemia type Continue statin as ordered and reviewed, no changes at this time   3. Essential hypertension Continue antihypertensive medications as already ordered, these medications have been reviewed and there are no changes at this time.    Current Outpatient Medications on File Prior to Visit  Medication Sig Dispense Refill  . apixaban (ELIQUIS) 5 MG TABS tablet Take 1 tablet (5 mg total) by mouth 2 (two) times daily. 180 tablet  3  . aspirin EC 81 MG tablet Take 1 tablet (81 mg total) by mouth daily. Swallow whole. 90 tablet 3  . ciclopirox (PENLAC) 8 % solution APPLY OVER NAIL AND SURROUNDING SKIN DAILY AT BEDTIME OVER PREVIOUS COAT. AFTER 7 DAYS, MAY REMOVE WITH ALCOHOL AND CONTINUE CYCLE 6.6 mL 0  . furosemide (LASIX) 20 MG tablet TK 1 T PO QD PRN.  2  . gabapentin (NEURONTIN) 300 MG capsule TAKE 1 CAPSULE BY MOUTH AT BEDTIME 60 capsule 0  . Multiple Vitamin (MULTIVITAMIN WITH MINERALS) TABS tablet Take 1 tablet by mouth every evening.     .[name]  Omega-3 Fatty Acids (FISH OIL) 1200 MG CAPS Take 2,400 mg by mouth 2 (two) times daily.    [name] oxyCODONE (OXY IR/ROXICODONE) 5 MG immediate release tablet Take 1 tablet (5 mg total) by mouth every 6 (six) hours as needed for severe pain. 28 tablet 0  . rosuvastatin (CRESTOR) 20 MG tablet Take 20 mg by mouth daily.    . sertraline (ZOLOFT) 100 MG tablet TK 1 T PO QD  3  . Specialty Vitamins Products (ONE-A-DAY PROSTATE PO) Take 1 tablet by mouth daily. "Super Beta Prostate"     . tamsulosin (FLOMAX) 0.4 MG CAPS capsule Take 0.4 mg by mouth daily.    [name] Zn-Pyg Afri-Nettle-Saw Palmet (SAW PALMETTO COMPLEX PO) Take 2 tablets by mouth daily.      No current facility-administered medications on file prior to visit.    There are no Patient Instructions on file for this visit. No follow-ups on file.   Georgiana Spinner, NP

## 2020-02-09 ENCOUNTER — Encounter (INDEPENDENT_AMBULATORY_CARE_PROVIDER_SITE_OTHER): Payer: BC Managed Care – PPO

## 2020-02-09 ENCOUNTER — Ambulatory Visit (INDEPENDENT_AMBULATORY_CARE_PROVIDER_SITE_OTHER): Payer: BC Managed Care – PPO | Admitting: Nurse Practitioner

## 2020-03-30 ENCOUNTER — Encounter (INDEPENDENT_AMBULATORY_CARE_PROVIDER_SITE_OTHER): Payer: BC Managed Care – PPO

## 2020-03-30 ENCOUNTER — Ambulatory Visit (INDEPENDENT_AMBULATORY_CARE_PROVIDER_SITE_OTHER): Payer: BC Managed Care – PPO | Admitting: Vascular Surgery

## 2020-04-12 DIAGNOSIS — I10 Essential (primary) hypertension: Secondary | ICD-10-CM | POA: Diagnosis not present

## 2020-04-12 DIAGNOSIS — E785 Hyperlipidemia, unspecified: Secondary | ICD-10-CM | POA: Diagnosis not present

## 2020-04-26 ENCOUNTER — Other Ambulatory Visit (INDEPENDENT_AMBULATORY_CARE_PROVIDER_SITE_OTHER): Payer: Self-pay | Admitting: Nurse Practitioner

## 2020-04-26 DIAGNOSIS — I70213 Atherosclerosis of native arteries of extremities with intermittent claudication, bilateral legs: Secondary | ICD-10-CM

## 2020-04-27 ENCOUNTER — Ambulatory Visit (INDEPENDENT_AMBULATORY_CARE_PROVIDER_SITE_OTHER): Payer: BC Managed Care – PPO | Admitting: Vascular Surgery

## 2020-04-27 ENCOUNTER — Encounter (INDEPENDENT_AMBULATORY_CARE_PROVIDER_SITE_OTHER): Payer: Self-pay | Admitting: Vascular Surgery

## 2020-04-27 ENCOUNTER — Ambulatory Visit (INDEPENDENT_AMBULATORY_CARE_PROVIDER_SITE_OTHER): Payer: BC Managed Care – PPO

## 2020-04-27 ENCOUNTER — Other Ambulatory Visit: Payer: Self-pay

## 2020-04-27 VITALS — BP 126/77 | HR 72 | Resp 16 | Wt 162.4 lb

## 2020-04-27 DIAGNOSIS — E785 Hyperlipidemia, unspecified: Secondary | ICD-10-CM

## 2020-04-27 DIAGNOSIS — I10 Essential (primary) hypertension: Secondary | ICD-10-CM | POA: Diagnosis not present

## 2020-04-27 DIAGNOSIS — M7989 Other specified soft tissue disorders: Secondary | ICD-10-CM

## 2020-04-27 DIAGNOSIS — I70213 Atherosclerosis of native arteries of extremities with intermittent claudication, bilateral legs: Secondary | ICD-10-CM

## 2020-04-27 NOTE — Assessment & Plan Note (Signed)
lipid control important in reducing the progression of atherosclerotic disease. Continue statin therapy  

## 2020-04-27 NOTE — Assessment & Plan Note (Signed)
blood pressure control important in reducing the progression of atherosclerotic disease. On appropriate oral medications.  

## 2020-04-27 NOTE — Progress Notes (Signed)
MRN : [id number]  Travis Salas is a 64 y.o. ([birth year]) male who presents with chief complaint of  Chief Complaint  Patient presents with   Follow-up    3 month ultrasound follow up  .  History of Present Illness: Patient returns today in follow up of his PAD.  He is doing reasonably well at this time.  He continues to have moderate distance claudication in the left leg that has not dramatically changed.  His right leg is doing well after multiple interventions earlier this year.  He had a right leg bypass several years ago for limb threatening ischemia. His ABIs today are normal on the right at 1.05 and stable on the left at 0.5 without significant change from previous studies.  Current Outpatient Medications  Medication Sig Dispense Refill   apixaban (ELIQUIS) 5 MG TABS tablet Take 1 tablet (5 mg total) by mouth 2 (two) times daily. 180 tablet 3   aspirin EC 81 MG tablet Take 1 tablet (81 mg total) by mouth daily. Swallow whole. 90 tablet 3   ciclopirox (PENLAC) 8 % solution APPLY OVER NAIL AND SURROUNDING SKIN DAILY AT BEDTIME OVER PREVIOUS COAT. AFTER 7 DAYS, MAY REMOVE WITH ALCOHOL AND CONTINUE CYCLE 6.6 mL 0   furosemide (LASIX) 20 MG tablet TK 1 T PO QD PRN.  2   gabapentin (NEURONTIN) 300 MG capsule TAKE 1 CAPSULE BY MOUTH AT BEDTIME 60 capsule 0   Multiple Vitamin (MULTIVITAMIN WITH MINERALS) TABS tablet Take 1 tablet by mouth every evening.      Omega-3 Fatty Acids (FISH OIL) 1200 MG CAPS Take 2,400 mg by mouth 2 (two) times daily.     oxyCODONE (OXY IR/ROXICODONE) 5 MG immediate release tablet Take 1 tablet (5 mg total) by mouth every 6 (six) hours as needed for severe pain. 28 tablet 0   rosuvastatin (CRESTOR) 20 MG tablet Take 20 mg by mouth daily.     sertraline (ZOLOFT) 100 MG tablet TK 1 T PO QD  3   tamsulosin (FLOMAX) 0.4 MG CAPS capsule Take 0.4 mg by mouth daily.     Specialty Vitamins Products (ONE-A-DAY PROSTATE PO) Take 1 tablet by mouth daily.  "Super Beta Prostate"      Zn-Pyg Afri-Nettle-Saw Palmet (SAW PALMETTO COMPLEX PO) Take 2 tablets by mouth daily.      No current facility-administered medications for this visit.    Past Medical History:  Diagnosis Date   Collagen vascular disease (HCC)    Hyperlipidemia    Hypertension    Peripheral vascular disease (HCC)     Past Surgical History:  Procedure Laterality Date   BYPASS GRAFT ANGIOGRAPHY Right 11/21/2019   Procedure: BYPASS GRAFT ANGIOGRAPHY;  Surgeon: Griffith Citron, MD;  Location: [facility];  Service: Cardiovascular;  Laterality: Right;   CENTRAL LINE INSERTION Left 01/07/2020   Procedure: CENTRAL LINE INSERTION;  Surgeon: Annice Needy, MD;  Location: [facility];  Service: Vascular;  Laterality: Left;   ENDARTERECTOMY FEMORAL Right 12/23/2015   Procedure: COMMON, FEMORAL, SUPERFICIAL, AND PROFUNDA ENDARTERECTOMY;  Surgeon: Annice Needy, MD;  Location: [facility];  Service: Vascular;  Laterality: Right;   FEMORAL-TIBIAL BYPASS GRAFT Right 12/23/2015   Procedure: BYPASS GRAFT FEMORAL-TIBIAL ARTERY, COMMON FEMORAL AND PROFUNDIS FEMORAL WITH PATCH ANGIOPLASTY, POPLITEAL ANTERIOR ARTERY BYPASS, ILIOFEMORAL ANGIOGRAM VIABHAM STENT 8 MM DIAMETER X 5 CM LENGTH;  Surgeon: Annice Needy, MD;  Location: [facility];  Service: Vascular;  Laterality: Right;   INSERTION OF ILIAC  STENT Right 01/07/2020   Procedure: INSERTION OF ILIAC STENT;  Surgeon: Annice Needy, MD;  Location: [facility];  Service: Vascular;  Laterality: Right;   LOWER EXTREMITY ANGIOGRAM Right 01/07/2020   Procedure: LOWER EXTREMITY ANGIOGRAM  insertion of infusion catheter ;  Surgeon: Annice Needy, MD;  Location: [facility];  Service: Vascular;  Laterality: Right;   LOWER EXTREMITY ANGIOGRAPHY Right 06/19/2019   Procedure: LOWER EXTREMITY ANGIOGRAPHY;  Surgeon: Annice Needy, MD;  Location: [facility];  Service: Cardiovascular;  Laterality: Right;   LOWER EXTREMITY ANGIOGRAPHY Right 06/20/2019   Procedure:  Lower Extremity Angiography;  Surgeon: Annice Needy, MD;  Location: [facility];  Service: Cardiovascular;  Laterality: Right;   LOWER EXTREMITY ANGIOGRAPHY Right 01/08/2020   Procedure: Lower Extremity Angiography;  Surgeon: Annice Needy, MD;  Location: [facility];  Service: Cardiovascular;  Laterality: Right;   PERIPHERAL VASCULAR CATHETERIZATION Right 11/08/2015   Procedure: Lower Extremity Angiography;  Surgeon: Annice Needy, MD;  Location: [facility];  Service: Cardiovascular;  Laterality: Right;   PERIPHERAL VASCULAR CATHETERIZATION Right 12/13/2015   Procedure: Lower Extremity Angiography;  Surgeon: Annice Needy, MD;  Location: [facility];  Service: Cardiovascular;  Laterality: Right;   PERIPHERAL VASCULAR THROMBECTOMY Right 11/21/2019   Procedure: PERIPHERAL VASCULAR THROMBECTOMY;  Surgeon: Griffith Citron, MD;  Location: [facility];  Service: Cardiovascular;  Laterality: Right;   PERIPHERAL VASCULAR THROMBECTOMY Right 11/22/2019   Procedure: PERIPHERAL VASCULAR THROMBECTOMY;  Surgeon: Griffith Citron, MD;  Location: [facility];  Service: Cardiovascular;  Laterality: Right;     Social History   Tobacco Use   Smoking status: Former Smoker    Packs/day: 1.50    Years: 46.00    Pack years: 69.00    Types: Cigarettes    Quit date: 02/05/2015    Years since quitting: 5.2   Smokeless tobacco: Never Used  Vaping Use   Vaping Use: Never used  Substance Use Topics   Alcohol use: Yes    Alcohol/week: 7.0 standard drinks    Types: 7 Cans of beer per week   Drug use: Yes    Types: Marijuana    Comment: occasional     Family History  Problem Relation Age of Onset   Diabetes Mother    Hypertension Brother   brother with PAD Mother with lymphedema  Allergies  Allergen Reactions   Hydrocodone Swelling     REVIEW OF SYSTEMS (Negative unless checked)  Constitutional: [] Weight loss  [] Fever  [] Chills Cardiac: [] Chest  pain   [] Chest pressure   [] Palpitations   [] Shortness of breath when laying flat   [] Shortness of breath at rest   [] Shortness of breath with exertion. Vascular:  [x] Pain in legs with walking   [] Pain in legs at rest   [] Pain in legs when laying flat   [x] Claudication   [] Pain in feet when walking  [] Pain in feet at rest  [] Pain in feet when laying flat   [] History of DVT   [] Phlebitis   [] Swelling in legs   [] Varicose veins   [] Non-healing ulcers Pulmonary:   [] Uses home oxygen   [] Productive cough   [] Hemoptysis   [] Wheeze  [] COPD   [] Asthma Neurologic:  [] Dizziness  [] Blackouts   [] Seizures   [] History of stroke   [] History of TIA  [] Aphasia   [] Temporary blindness   [] Dysphagia   [] Weakness or numbness in arms   [] Weakness or numbness in legs  Musculoskeletal:  [x] Arthritis   [] Joint swelling   [x] Joint pain   [] Low back pain Hematologic:  [] Easy bruising  [] Easy bleeding   [] Hypercoagulable state   [] Anemic   Gastrointestinal:  [] Blood in stool   [] Vomiting blood  [] Gastroesophageal reflux/heartburn   [] Abdominal pain Genitourinary:  [] Chronic kidney disease   [] Difficult urination  [] Frequent urination  [] Burning with urination   [] Hematuria Skin:  [] Rashes   [] Ulcers   [] Wounds Psychological:  [] History of anxiety   []  History of major depression.  Physical Examination  BP 126/77 (BP Location: Right Arm)    Pulse 72    Resp 16    Wt 162 lb 6.4 oz (73.7 kg)    BMI 25.44 kg/m  Gen:  WD/WN, NAD Head: [place]/AT, No temporalis wasting. Ear/Nose/Throat: Hearing grossly intact, nares w/o erythema or drainage Eyes: Conjunctiva clear. Sclera non-icteric Neck: Supple.  Trachea midline Pulmonary:  Good air movement, no use of accessory muscles.  Cardiac: RRR, no JVD Vascular:  Vessel Right Left  Radial Palpable Palpable                          PT  1+ palpable  not palpable  DP Palpable  1+ palpable   Gastrointestinal: soft, non-tender/non-distended. No guarding/reflex.  Musculoskeletal:  M/S 5/5 throughout.  No deformity or atrophy.  1+ right lower extremity edema. Neurologic: Sensation grossly intact in extremities.  Symmetrical.  Speech is fluent.  Psychiatric: Judgment intact, Mood & affect appropriate for pt's clinical situation. Dermatologic: No rashes or ulcers noted.  No cellulitis or open wounds.       Labs No results found for this or any previous visit (from the past 2160 hour(s)).  Radiology No results found.  Assessment/Plan  Hyperlipidemia lipid control important in reducing the progression of atherosclerotic disease. Continue statin therapy   Hypertension blood pressure control important in reducing the progression of atherosclerotic disease. On appropriate oral medications.   Swelling of limb Reasonably well-controlled recently.  Atherosclerosis of native arteries of extremity with intermittent claudication (HCC) His ABIs today are normal on the right at 1.05 and stable on the left at 0.5 without significant change from previous studies.  Continue aspirin, Eliquis, and statin agent.  No changes or immediate need for intervention at this time.  Recheck in 4 months with noninvasive studies.    , MD  04/27/2020 3:41 PM    This note was created with Dragon medical transcription system.  Any errors from dictation are purely unintentional

## 2020-04-27 NOTE — Assessment & Plan Note (Signed)
Reasonably well-controlled recently.

## 2020-04-27 NOTE — Assessment & Plan Note (Signed)
His ABIs today are normal on the right at 1.05 and stable on the left at 0.5 without significant change from previous studies.  Continue aspirin, Eliquis, and statin agent.  No changes or immediate need for intervention at this time.  Recheck in 4 months with noninvasive studies.

## 2020-04-28 DIAGNOSIS — Z23 Encounter for immunization: Secondary | ICD-10-CM | POA: Diagnosis not present

## 2020-05-03 DIAGNOSIS — I739 Peripheral vascular disease, unspecified: Secondary | ICD-10-CM | POA: Diagnosis not present

## 2020-05-03 DIAGNOSIS — E785 Hyperlipidemia, unspecified: Secondary | ICD-10-CM | POA: Diagnosis not present

## 2020-05-03 DIAGNOSIS — I10 Essential (primary) hypertension: Secondary | ICD-10-CM | POA: Diagnosis not present

## 2020-07-30 ENCOUNTER — Telehealth (INDEPENDENT_AMBULATORY_CARE_PROVIDER_SITE_OTHER): Payer: Self-pay | Admitting: Vascular Surgery

## 2020-07-30 NOTE — Telephone Encounter (Signed)
Patients spouse called in stating due to the change in patients insurance, they will need to change his Eliquis medication to a 90 day supply instead of a 30 day supply.  Patient

## 2020-08-24 ENCOUNTER — Ambulatory Visit (INDEPENDENT_AMBULATORY_CARE_PROVIDER_SITE_OTHER): Payer: BC Managed Care – PPO | Admitting: Vascular Surgery

## 2020-08-24 ENCOUNTER — Encounter (INDEPENDENT_AMBULATORY_CARE_PROVIDER_SITE_OTHER): Payer: Self-pay | Admitting: Vascular Surgery

## 2020-08-24 ENCOUNTER — Other Ambulatory Visit: Payer: Self-pay

## 2020-08-24 ENCOUNTER — Ambulatory Visit (INDEPENDENT_AMBULATORY_CARE_PROVIDER_SITE_OTHER): Payer: BC Managed Care – PPO

## 2020-08-24 VITALS — BP 114/72 | HR 78 | Ht 69.0 in | Wt 162.0 lb

## 2020-08-24 DIAGNOSIS — E785 Hyperlipidemia, unspecified: Secondary | ICD-10-CM

## 2020-08-24 DIAGNOSIS — I70213 Atherosclerosis of native arteries of extremities with intermittent claudication, bilateral legs: Secondary | ICD-10-CM

## 2020-08-24 DIAGNOSIS — M7989 Other specified soft tissue disorders: Secondary | ICD-10-CM

## 2020-08-24 DIAGNOSIS — I10 Essential (primary) hypertension: Secondary | ICD-10-CM

## 2020-08-24 NOTE — Progress Notes (Signed)
MRN : [id number]  Travis Salas is a 65 y.o. (06-19-1955) male who presents with chief complaint of  Chief Complaint  Patient presents with  . Follow-up    4 Mo U/S   .  History of Present Illness: Patient returns today in follow up of his PAD.  He is doing reasonably well.  He continues to have significant claudication symptoms in the left leg but they have not really improved or regressed since his last visit 4 months ago.  No new ulceration or infection.  No rest pain.  He has previously undergone extensive revascularization with both surgical bypass about 4 to 5 years ago and then subsequent interventions x2 last year for limb threatening ischemia.  He continues on Eliquis and is tolerating this well without any signs of bleeding.  Mild right lower extremity swelling which is stable.  ABIs today are 1.13 with multiphasic waveforms and normal digital pressures on the right.  The left ABI is 0.48 with monophasic waveforms which is stable from his previous study of 0.44 last year.  Current Outpatient Medications  Medication Sig Dispense Refill  . amLODipine-benazepril (LOTREL) 5-20 MG capsule Take 1 capsule by mouth 2 (two) times daily.    [name] apixaban (ELIQUIS) 5 MG TABS tablet Take 1 tablet (5 mg total) by mouth 2 (two) times daily. 180 tablet 3  . aspirin EC 81 MG tablet Take 1 tablet (81 mg total) by mouth daily. Swallow whole. 90 tablet 3  . ciclopirox (PENLAC) 8 % solution APPLY OVER NAIL AND SURROUNDING SKIN DAILY AT BEDTIME OVER PREVIOUS COAT. AFTER 7 DAYS, MAY REMOVE WITH ALCOHOL AND CONTINUE CYCLE 6.6 mL 0  . furosemide (LASIX) 20 MG tablet TK 1 T PO QD PRN.  2  . gabapentin (NEURONTIN) 300 MG capsule TAKE 1 CAPSULE BY MOUTH AT BEDTIME 60 capsule 0  . Multiple Vitamin (MULTIVITAMIN WITH MINERALS) TABS tablet Take 1 tablet by mouth every evening.     . Omega-3 Fatty Acids (FISH OIL) 1200 MG CAPS Take 2,400 mg by mouth 2 (two) times daily.    [name] oxyCODONE (OXY IR/ROXICODONE) 5 MG  immediate release tablet Take 1 tablet (5 mg total) by mouth every 6 (six) hours as needed for severe pain. 28 tablet 0  . rosuvastatin (CRESTOR) 20 MG tablet Take 20 mg by mouth daily.    . sertraline (ZOLOFT) 100 MG tablet TK 1 T PO QD  3  . tamsulosin (FLOMAX) 0.4 MG CAPS capsule Take 0.4 mg by mouth daily.    [name] Specialty Vitamins Products (ONE-A-DAY PROSTATE PO) Take 1 tablet by mouth daily. "Super Beta Prostate"     . Zn-Pyg Afri-Nettle-Saw Palmet (SAW PALMETTO COMPLEX PO) Take 2 tablets by mouth daily.      No current facility-administered medications for this visit.    Past Medical History:  Diagnosis Date  . Collagen vascular disease (HCC)   . Hyperlipidemia   . Hypertension   . Peripheral vascular disease [facility])     Past Surgical History:  Procedure Laterality Date  . BYPASS GRAFT ANGIOGRAPHY Right 11/21/2019   Procedure: BYPASS GRAFT ANGIOGRAPHY;  Surgeon: Griffith Citron, MD;  Location: [facility];  Service: Cardiovascular;  Laterality: Right;  . CENTRAL LINE INSERTION Left 01/07/2020   Procedure: CENTRAL LINE INSERTION;  Surgeon: Annice Needy, MD;  Location: [facility];  Service: Vascular;  Laterality: Left;  . ENDARTERECTOMY FEMORAL Right 12/23/2015   Procedure: COMMON, FEMORAL, SUPERFICIAL, AND PROFUNDA ENDARTERECTOMY;  Surgeon: Barbara Cower  Driscilla Grammes, MD;  Location: [facility];  Service: Vascular;  Laterality: Right;  . FEMORAL-TIBIAL BYPASS GRAFT Right 12/23/2015   Procedure: BYPASS GRAFT FEMORAL-TIBIAL ARTERY, COMMON FEMORAL AND PROFUNDIS FEMORAL WITH PATCH ANGIOPLASTY, POPLITEAL ANTERIOR ARTERY BYPASS, ILIOFEMORAL ANGIOGRAM VIABHAM STENT 8 MM DIAMETER X 5 CM LENGTH;  Surgeon: Annice Needy, MD;  Location: [facility];  Service: Vascular;  Laterality: Right;  . INSERTION OF ILIAC STENT Right 01/07/2020   Procedure: INSERTION OF ILIAC STENT;  Surgeon: Annice Needy, MD;  Location: [facility];  Service: Vascular;  Laterality: Right;  . LOWER EXTREMITY ANGIOGRAM Right 01/07/2020   Procedure:  LOWER EXTREMITY ANGIOGRAM  insertion of infusion catheter ;  Surgeon: Annice Needy, MD;  Location: [facility];  Service: Vascular;  Laterality: Right;  . LOWER EXTREMITY ANGIOGRAPHY Right 06/19/2019   Procedure: LOWER EXTREMITY ANGIOGRAPHY;  Surgeon: Annice Needy, MD;  Location: [facility];  Service: Cardiovascular;  Laterality: Right;  . LOWER EXTREMITY ANGIOGRAPHY Right 06/20/2019   Procedure: Lower Extremity Angiography;  Surgeon: Annice Needy, MD;  Location: [facility];  Service: Cardiovascular;  Laterality: Right;  . LOWER EXTREMITY ANGIOGRAPHY Right 01/08/2020   Procedure: Lower Extremity Angiography;  Surgeon: Annice Needy, MD;  Location: [facility];  Service: Cardiovascular;  Laterality: Right;  . PERIPHERAL VASCULAR CATHETERIZATION Right 11/08/2015   Procedure: Lower Extremity Angiography;  Surgeon: Annice Needy, MD;  Location: [facility];  Service: Cardiovascular;  Laterality: Right;  . PERIPHERAL VASCULAR CATHETERIZATION Right 12/13/2015   Procedure: Lower Extremity Angiography;  Surgeon: Annice Needy, MD;  Location: [facility];  Service: Cardiovascular;  Laterality: Right;  . PERIPHERAL VASCULAR THROMBECTOMY Right 11/21/2019   Procedure: PERIPHERAL VASCULAR THROMBECTOMY;  Surgeon: Griffith Citron, MD;  Location: [facility];  Service: Cardiovascular;  Laterality: Right;  . PERIPHERAL VASCULAR THROMBECTOMY Right 11/22/2019   Procedure: PERIPHERAL VASCULAR THROMBECTOMY;  Surgeon: Griffith Citron, MD;  Location: [facility];  Service: Cardiovascular;  Laterality: Right;    Social History        Tobacco Use  . Smoking status: Former Smoker    Packs/day: 1.50    Years: 46.00    Pack years: 69.00    Types: Cigarettes    Quit date: 02/05/2015    Years since quitting: 5.2  . Smokeless tobacco: Never Used  Vaping Use  . Vaping Use: Never used  Substance Use Topics  . Alcohol use: Yes    Alcohol/week: 7.0 standard drinks     Types: 7 Cans of beer per week  . Drug use: Yes    Types: Marijuana    Comment: occasional          Family History  Problem Relation Age of Onset  . Diabetes Mother   . Hypertension Brother   brother with PAD Mother with lymphedema      Allergies  Allergen Reactions  . Hydrocodone Swelling     REVIEW OF SYSTEMS (Negative unless checked)  Constitutional: [] ?Weight loss  [] ?Fever  [] ?Chills Cardiac: [] ?Chest pain   [] ?Chest pressure   [] ?Palpitations   [] ?Shortness of breath when laying flat   [] ?Shortness of breath at rest   [] ?Shortness of breath with exertion. Vascular:  [x] ?Pain in legs with walking   [] ?Pain in legs at rest   [] ?Pain in legs when laying flat   [x] ?Claudication   [] ?Pain in feet when walking  [] ?Pain in feet at rest  [] ?Pain in feet when laying  flat   [] ?History of DVT   [] ?Phlebitis   [] ?Swelling in legs   [] ?Varicose veins   [] ?Non-healing ulcers Pulmonary:   [] ?Uses home oxygen   [] ?Productive cough   [] ?Hemoptysis   [] ?Wheeze  [] ?COPD   [] ?Asthma Neurologic:  [] ?Dizziness  [] ?Blackouts   [] ?Seizures   [] ?History of stroke   [] ?History of TIA  [] ?Aphasia   [] ?Temporary blindness   [] ?Dysphagia   [] ?Weakness or numbness in arms   [] ?Weakness or numbness in legs Musculoskeletal:  [x] ?Arthritis   [] ?Joint swelling   [x] ?Joint pain   [] ?Low back pain Hematologic:  [] ?Easy bruising  [] ?Easy bleeding   [] ?Hypercoagulable state   [] ?Anemic   Gastrointestinal:  [] ?Blood in stool   [] ?Vomiting blood  [] ?Gastroesophageal reflux/heartburn   [] ?Abdominal pain Genitourinary:  [] ?Chronic kidney disease   [] ?Difficult urination  [] ?Frequent urination  [] ?Burning with urination   [] ?Hematuria Skin:  [] ?Rashes   [] ?Ulcers   [] ?Wounds Psychological:  [] ?History of anxiety   [] ? History of major depression.    Physical Examination  BP 114/72   Pulse 78   Ht 5\' 9"  (1.753 m)   Wt 162 lb (73.5 kg)   BMI 23.92 kg/m  Gen:  WD/WN, NAD Head: Coal/AT, No  temporalis wasting. Ear/Nose/Throat: Hearing grossly intact, nares w/o erythema or drainage Eyes: Conjunctiva clear. Sclera non-icteric Neck: Supple.  Trachea midline Pulmonary:  Good air movement, no use of accessory muscles.  Cardiac: RRR, no JVD Vascular:  Vessel Right Left  Radial Palpable Palpable                          PT Palpable  not palpable  DP Palpable  trace palpable   Gastrointestinal: soft, non-tender/non-distended. No guarding/reflex.  Musculoskeletal: M/S 5/5 throughout.  No deformity or atrophy.  Mild right lower extremity edema. Neurologic: Sensation grossly intact in extremities.  Symmetrical.  Speech is fluent.  Psychiatric: Judgment intact, Mood & affect appropriate for pt's clinical situation. Dermatologic: No rashes or ulcers noted.  No cellulitis or open wounds.       Labs No results found for this or any previous visit (from the past 2160 hour(s)).  Radiology No results found.  Assessment/Plan Hyperlipidemia lipid control important in reducing the progression of atherosclerotic disease. Continue statin therapy   Hypertension blood pressure control important in reducing the progression of atherosclerotic disease. On appropriate oral medications.   Swelling of limb Reasonably well-controlled recently.  Atherosclerosis of native arteries of extremity with intermittent claudication (HCC) ABIs today are 1.13 with multiphasic waveforms and normal digital pressures on the right.  The left ABI is 0.48 with monophasic waveforms which is stable from his previous study of 0.44 last year.  We again discussed the pathophysiology and natural history of peripheral arterial disease.  He does not have any immediate limb threatening symptoms.  I have told him again, as long as his claudication is tolerable we can continue to monitor the left leg.  His right leg perfusion is doing well.  He will continue his Eliquis.  We will check him on 6 months with  noninvasive studies or sooner if problems develop in the interim    , MD  08/24/2020 2:37 PM    This note was created with Dragon medical transcription system.  Any errors from dictation are purely unintentional

## 2020-08-24 NOTE — Assessment & Plan Note (Signed)
ABIs today are 1.13 with multiphasic waveforms and normal digital pressures on the right.  The left ABI is 0.48 with monophasic waveforms which is stable from his previous study of 0.44 last year.  We again discussed the pathophysiology and natural history of peripheral arterial disease.  He does not have any immediate limb threatening symptoms.  I have told him again, as long as his claudication is tolerable we can continue to monitor the left leg.  His right leg perfusion is doing well.  He will continue his Eliquis.  We will check him on 6 months with noninvasive studies or sooner if problems develop in the interim

## 2021-02-24 ENCOUNTER — Other Ambulatory Visit (INDEPENDENT_AMBULATORY_CARE_PROVIDER_SITE_OTHER): Payer: Self-pay | Admitting: Nurse Practitioner

## 2021-02-24 ENCOUNTER — Telehealth (INDEPENDENT_AMBULATORY_CARE_PROVIDER_SITE_OTHER): Payer: Self-pay

## 2021-02-24 NOTE — Telephone Encounter (Signed)
Pt's wife called and left a VM on the nurses line saying that her husband needs a refill on his eliquis. Please advise

## 2021-02-24 NOTE — Telephone Encounter (Signed)
Which pharmacy would he like it sent to?

## 2021-02-25 ENCOUNTER — Other Ambulatory Visit (INDEPENDENT_AMBULATORY_CARE_PROVIDER_SITE_OTHER): Payer: Self-pay | Admitting: Nurse Practitioner

## 2021-02-25 MED ORDER — APIXABAN 5 MG PO TABS: 5.0000 mg | ORAL_TABLET | Freq: Two times a day (BID) | ORAL | 3 refills | Status: AC

## 2021-02-25 NOTE — Telephone Encounter (Signed)
sent 

## 2021-02-25 NOTE — Telephone Encounter (Signed)
[facility] [id number] calling Rx Fax:[phone]  3 month mail order is requested

## 2021-03-01 ENCOUNTER — Other Ambulatory Visit: Payer: Self-pay

## 2021-03-01 ENCOUNTER — Ambulatory Visit (INDEPENDENT_AMBULATORY_CARE_PROVIDER_SITE_OTHER): Payer: Medicare Other

## 2021-03-01 ENCOUNTER — Ambulatory Visit (INDEPENDENT_AMBULATORY_CARE_PROVIDER_SITE_OTHER): Payer: Medicare Other | Admitting: Nurse Practitioner

## 2021-03-01 VITALS — BP 118/70 | HR 70 | Ht 67.0 in | Wt 157.0 lb

## 2021-03-01 DIAGNOSIS — I10 Essential (primary) hypertension: Secondary | ICD-10-CM

## 2021-03-01 DIAGNOSIS — E785 Hyperlipidemia, unspecified: Secondary | ICD-10-CM | POA: Diagnosis not present

## 2021-03-01 DIAGNOSIS — I70213 Atherosclerosis of native arteries of extremities with intermittent claudication, bilateral legs: Secondary | ICD-10-CM

## 2021-03-04 ENCOUNTER — Other Ambulatory Visit (INDEPENDENT_AMBULATORY_CARE_PROVIDER_SITE_OTHER): Payer: Self-pay | Admitting: Vascular Surgery

## 2021-03-06 ENCOUNTER — Encounter (INDEPENDENT_AMBULATORY_CARE_PROVIDER_SITE_OTHER): Payer: Self-pay | Admitting: Nurse Practitioner

## 2021-03-06 NOTE — Progress Notes (Signed)
Subjective:    Patient ID: Travis Salas, male    DOB: 04-05-1956, 65 y.o.   MRN: [id number] Chief Complaint  Patient presents with   Follow-up    6 Mo abi     Travis Salas is a 65 year old male that returns to the office for followup and review of the noninvasive studies. There have been no interval changes in lower extremity symptoms. No interval shortening of the patient's claudication distance or development of rest pain symptoms. No new ulcers or wounds have occurred since the last visit.  There have been no significant changes to the patient's overall health care.  The patient denies amaurosis fugax or recent TIA symptoms. There are no recent neurological changes noted. The patient denies history of DVT, PE or superficial thrombophlebitis. The patient denies recent episodes of angina or shortness of breath.   ABI Rt=1.00 and Lt=0.47  (previous ABI's Rt=1.13 and Lt=0.48) Duplex ultrasound of the right lower extremity shows triphasic waveforms with monophasic waveforms in the left.  Good toe waveforms on the right with slightly dampened on the left.  The studies are nearly unchanged from previous studies on 08/24/2020.  The patient also has a known occlusion of the left SFA.   Review of Systems  Cardiovascular:        Claudication  Skin:  Negative for wound.  All other systems reviewed and are negative.     Objective:   Physical Exam Vitals reviewed.  HENT:     Head: Normocephalic.  Cardiovascular:     Rate and Rhythm: Normal rate.     Pulses:          Dorsalis pedis pulses are 1+ on the right side and detected w/ Doppler on the left side.       Posterior tibial pulses are 1+ on the right side and detected w/ Doppler on the left side.  Pulmonary:     Effort: Pulmonary effort is normal.  Skin:    General: Skin is warm and dry.     Capillary Refill: Capillary refill takes 2 to 3 seconds.  Neurological:     Mental Status: He is alert and oriented to person, place,  and time.  Psychiatric:        Mood and Affect: Mood normal.        Behavior: Behavior normal.        Thought Content: Thought content normal.        Judgment: Judgment normal.    BP 118/70   Pulse 70   Ht 5\' 7"  (1.702 m)   Wt 157 lb (71.2 kg)   BMI 24.59 kg/m   Past Medical History:  Diagnosis Date   Collagen vascular disease (HCC)    Hyperlipidemia    Hypertension    Peripheral vascular disease (HCC)     Social History   Socioeconomic History   Marital status: Married    Spouse name: Kim   Number of children: Not on file   Years of education: Not on file   Highest education level: Not on file  Occupational History   Not on file  Tobacco Use   Smoking status: Former    Packs/day: 1.50    Years: 46.00    Pack years: 69.00    Types: Cigarettes    Quit date: 02/05/2015    Years since quitting: 6.0   Smokeless tobacco: Never  Vaping Use   Vaping Use: Never used  Substance and Sexual Activity   Alcohol use: Yes  Alcohol/week: 7.0 standard drinks    Types: 7 Cans of beer per week   Drug use: Yes    Types: Marijuana    Comment: occasional   Sexual activity: Not on file  Other Topics Concern   Not on file  Social History Narrative   Lives at home with wife in private residence   Social Determinants of Health   Financial Resource Strain: Not on file  Food Insecurity: Not on file  Transportation Needs: Not on file  Physical Activity: Not on file  Stress: Not on file  Social Connections: Not on file  Intimate Partner Violence: Not on file    Past Surgical History:  Procedure Laterality Date   BYPASS GRAFT ANGIOGRAPHY Right 11/21/2019   Procedure: BYPASS GRAFT ANGIOGRAPHY;  Surgeon: Griffith Citron, MD;  Location: [facility];  Service: Cardiovascular;  Laterality: Right;   CENTRAL LINE INSERTION Left 01/07/2020   Procedure: CENTRAL LINE INSERTION;  Surgeon: Annice Needy, MD;  Location: [facility];  Service: Vascular;  Laterality: Left;    ENDARTERECTOMY FEMORAL Right 12/23/2015   Procedure: COMMON, FEMORAL, SUPERFICIAL, AND PROFUNDA ENDARTERECTOMY;  Surgeon: Annice Needy, MD;  Location: [facility];  Service: Vascular;  Laterality: Right;   FEMORAL-TIBIAL BYPASS GRAFT Right 12/23/2015   Procedure: BYPASS GRAFT FEMORAL-TIBIAL ARTERY, COMMON FEMORAL AND PROFUNDIS FEMORAL WITH PATCH ANGIOPLASTY, POPLITEAL ANTERIOR ARTERY BYPASS, ILIOFEMORAL ANGIOGRAM VIABHAM STENT 8 MM DIAMETER X 5 CM LENGTH;  Surgeon: Annice Needy, MD;  Location: [facility];  Service: Vascular;  Laterality: Right;   INSERTION OF ILIAC STENT Right 01/07/2020   Procedure: INSERTION OF ILIAC STENT;  Surgeon: Annice Needy, MD;  Location: [facility];  Service: Vascular;  Laterality: Right;   LOWER EXTREMITY ANGIOGRAM Right 01/07/2020   Procedure: LOWER EXTREMITY ANGIOGRAM  insertion of infusion catheter ;  Surgeon: Annice Needy, MD;  Location: [facility];  Service: Vascular;  Laterality: Right;   LOWER EXTREMITY ANGIOGRAPHY Right 06/19/2019   Procedure: LOWER EXTREMITY ANGIOGRAPHY;  Surgeon: Annice Needy, MD;  Location: [facility];  Service: Cardiovascular;  Laterality: Right;   LOWER EXTREMITY ANGIOGRAPHY Right 06/20/2019   Procedure: Lower Extremity Angiography;  Surgeon: Annice Needy, MD;  Location: [facility];  Service: Cardiovascular;  Laterality: Right;   LOWER EXTREMITY ANGIOGRAPHY Right 01/08/2020   Procedure: Lower Extremity Angiography;  Surgeon: Annice Needy, MD;  Location: [facility];  Service: Cardiovascular;  Laterality: Right;   PERIPHERAL VASCULAR CATHETERIZATION Right 11/08/2015   Procedure: Lower Extremity Angiography;  Surgeon: Annice Needy, MD;  Location: [facility];  Service: Cardiovascular;  Laterality: Right;   PERIPHERAL VASCULAR CATHETERIZATION Right 12/13/2015   Procedure: Lower Extremity Angiography;  Surgeon: Annice Needy, MD;  Location: [facility];  Service: Cardiovascular;  Laterality: Right;   PERIPHERAL VASCULAR  THROMBECTOMY Right 11/21/2019   Procedure: PERIPHERAL VASCULAR THROMBECTOMY;  Surgeon: Griffith Citron, MD;  Location: [facility];  Service: Cardiovascular;  Laterality: Right;   PERIPHERAL VASCULAR THROMBECTOMY Right 11/22/2019   Procedure: PERIPHERAL VASCULAR THROMBECTOMY;  Surgeon: Griffith Citron, MD;  Location: [facility];  Service: Cardiovascular;  Laterality: Right;    Family History  Problem Relation Age of Onset   Diabetes Mother    Hypertension Brother     Allergies  Allergen Reactions   Hydrocodone Swelling    CBC Latest Ref Rng & Units 01/09/2020 01/08/2020 01/08/2020  WBC 4.0 - 10.5 K/uL 8.7 9.6 8.2  Hemoglobin  13.0 - 17.0 g/dL 10.4(L) 12.3(L) 12.7(L)  Hematocrit 39.0 - 52.0 % 31.6(L) 34.8(L) 38.0(L)  Platelets 150 - 400 K/uL 131(L) 141(L) 148(L)      CMP     Component Value Date/Time   NA 140 01/09/2020 0508   K 4.1 01/09/2020 0508   CL 106 01/09/2020 0508   CO2 26 01/09/2020 0508   GLUCOSE 124 (H) 01/09/2020 0508   BUN 22 01/09/2020 0508   CREATININE 0.85 01/09/2020 0508   CALCIUM 8.5 (L) 01/09/2020 0508   PROT 6.4 (L) 11/22/2019 0156   ALBUMIN 4.1 11/22/2019 0156   AST 49 (H) 11/22/2019 0156   ALT 27 11/22/2019 0156   ALKPHOS 63 11/22/2019 0156   BILITOT 1.3 (H) 11/22/2019 0156   GFRNONAA >60 01/09/2020 0508   GFRAA >60 01/09/2020 0508     No results found.     Assessment & Plan:   1. Atherosclerosis of native artery of both lower extremities with intermittent claudication (HCC) Currently the patient's left leg symptoms continue to be tolerable for him.  The patient is advised that if his claudication distance shortens or if he begins to develop rest pain with new wounds or ulcerations he should contact our office for sooner evaluation.  We will continue with 46-month follow-ups.  2. Primary hypertension Continue antihypertensive medications as already ordered, these medications have been reviewed and there are no changes at this time.    3. Hyperlipidemia, unspecified hyperlipidemia type Continue statin as ordered and reviewed, no changes at this time    Current Outpatient Medications on File Prior to Visit  Medication Sig Dispense Refill   amLODipine-benazepril (LOTREL) 5-20 MG capsule Take 1 capsule by mouth 2 (two) times daily.     apixaban (ELIQUIS) 5 MG TABS tablet Take 1 tablet (5 mg total) by mouth 2 (two) times daily. 180 tablet 3   aspirin EC 81 MG tablet Take 1 tablet (81 mg total) by mouth daily. Swallow whole. 90 tablet 3   ciclopirox (PENLAC) 8 % solution APPLY OVER NAIL AND SURROUNDING SKIN DAILY AT BEDTIME OVER PREVIOUS COAT. AFTER 7 DAYS, MAY REMOVE WITH ALCOHOL AND CONTINUE CYCLE 6.6 mL 0   furosemide (LASIX) 20 MG tablet TK 1 T PO QD PRN.  2   gabapentin (NEURONTIN) 300 MG capsule TAKE 1 CAPSULE BY MOUTH AT BEDTIME 60 capsule 0   Multiple Vitamin (MULTIVITAMIN WITH MINERALS) TABS tablet Take 1 tablet by mouth every evening.      Omega-3 Fatty Acids (FISH OIL) 1200 MG CAPS Take 2,400 mg by mouth 2 (two) times daily.     oxyCODONE (OXY IR/ROXICODONE) 5 MG immediate release tablet Take 1 tablet (5 mg total) by mouth every 6 (six) hours as needed for severe pain. 28 tablet 0   rosuvastatin (CRESTOR) 20 MG tablet Take 20 mg by mouth daily.     sertraline (ZOLOFT) 100 MG tablet TK 1 T PO QD  3   tamsulosin (FLOMAX) 0.4 MG CAPS capsule Take 0.4 mg by mouth daily.     Specialty Vitamins Products (ONE-A-DAY PROSTATE PO) Take 1 tablet by mouth daily. "Super Beta Prostate"      Zn-Pyg Afri-Nettle-Saw Palmet (SAW PALMETTO COMPLEX PO) Take 2 tablets by mouth daily.      No current facility-administered medications on file prior to visit.    There are no Patient Instructions on file for this visit. No follow-ups on file.   Georgiana Spinner, NP

## 2021-04-27 IMAGING — MR MR PROSTATE WO/W CM
36 series · 48 of 48 positions shown · IV contrast (gadavist)
Comparison: None.

CLINICAL DATA: Elevated PSA (6.8), no hx of bx of surgery, patient
refuses to use the restroom before procedure 7 mL^7mL GADAVIST
GADOBUTROL 1 MMOL/ML IV SOLN

EXAM:
MR PROSTATE WITHOUT AND WITH CONTRAST
TECHNIQUE: Multiplanar multisequence MRI images were obtained of the pelvis
centered about the prostate. Pre and post contrast images were
obtained.
CONTRAST:  7mL GADAVIST GADOBUTROL 1 MMOL/ML IV SOLN

[Series 3: T1 · axial · 8.0mm · 0.74mm/px · z∈[-70,+170]mm · 2 of 25 slices shown (1 of 28)]
[im 1/25]
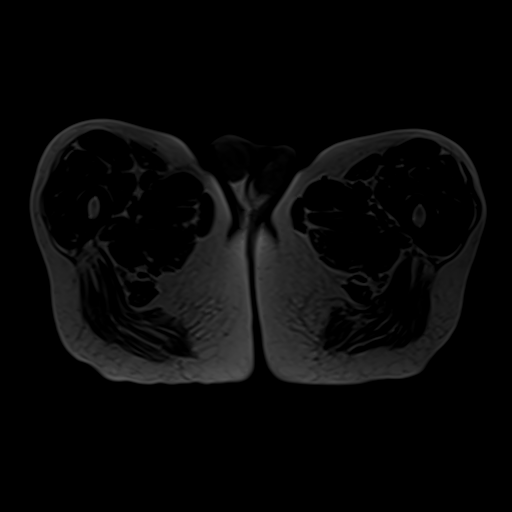
[im 25/25]
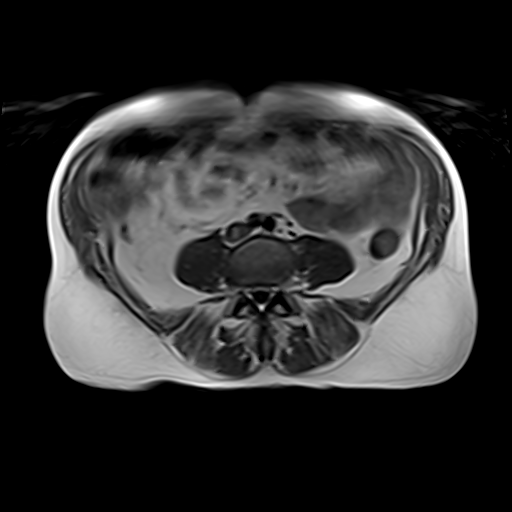

[Series 4: bSSFP fat-sat · axial · 8.0mm · 0.74mm/px · z∈[-70,+170]mm · 2 of 25 slices shown]
[im 1/25]
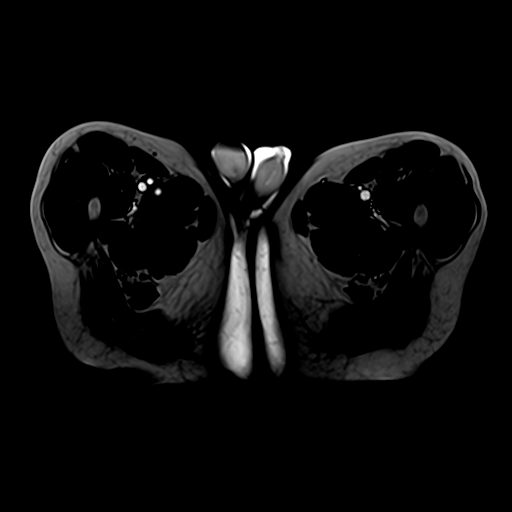
[im 25/25]
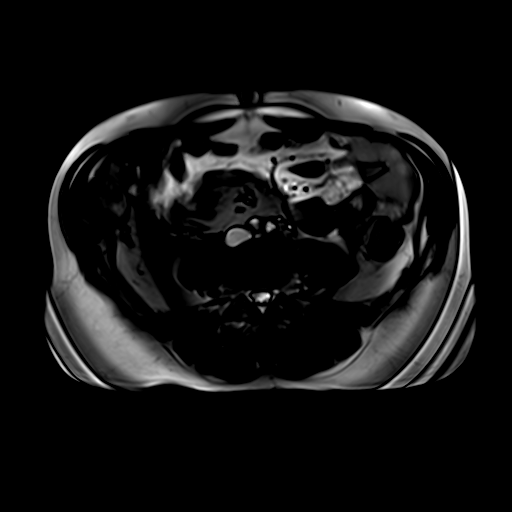

[Series 5: T2 · coronal · 3.0mm · 0.70mm/px · 2 of 30 slices shown (1 of 3)]
[im 1/30]
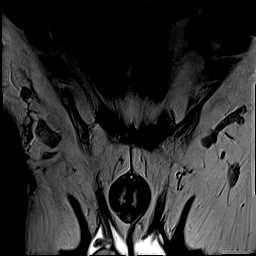
[im 30/30]
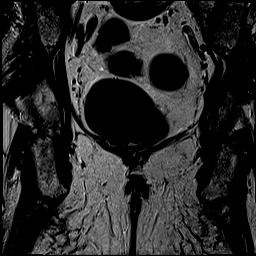

[Series 6: T2 · sagittal · 3.5mm · 0.62mm/px · 3 of 35 slices shown (2 of 3)]
[im 1/35]
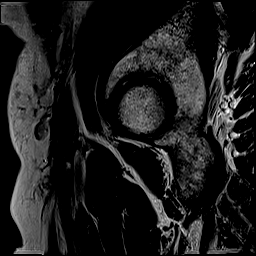
[im 18/35]
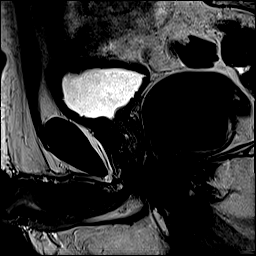
[im 35/35]
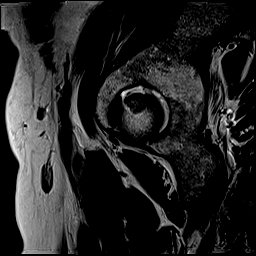

[Series 7: T1 · axial · 3.0mm · 0.35mm/px · z∈[-26,+76]mm · 2 of 30 slices shown (2 of 28)]
[im 1/30]
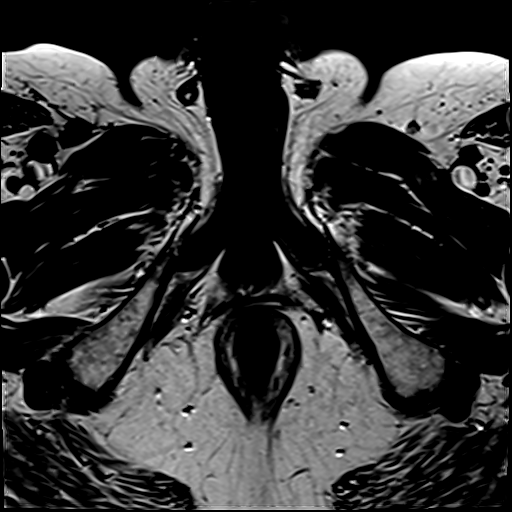
[im 30/30]
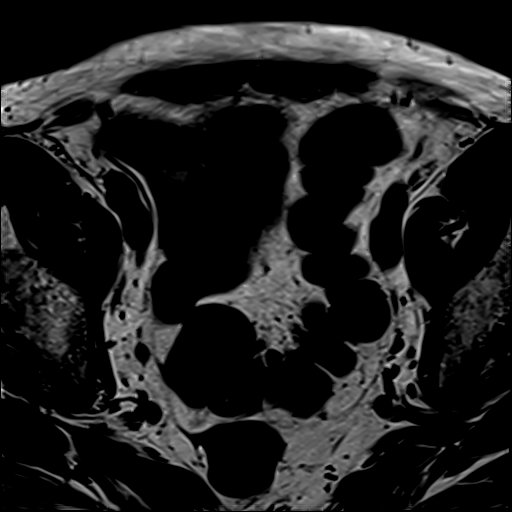

[Series 8: T2 · axial · 3.5mm · 0.56mm/px · z∈[-13,+64]mm · 2 of 23 slices shown (3 of 3)]
[im 1/23]
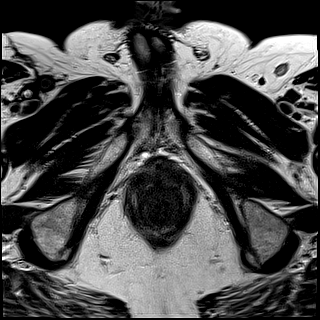
[im 23/23]
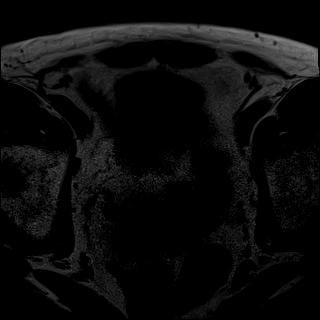

[Series 9: t2_space_tra (id) · axial · 1.0mm · 1.04mm/px · z∈[-14,+65]mm · 4 of 80 slices shown]
[im 1/80]
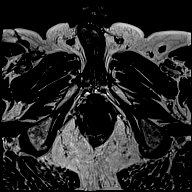
[im 27/80]
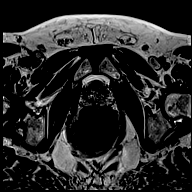
[im 53/80]
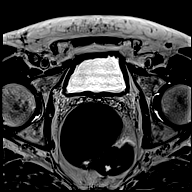
[im 80/80]
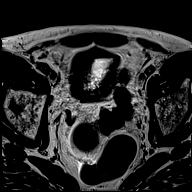

[Series 10: ax dwi_tracew · axial · 3.0mm · 0.78mm/px · z∈[-16,+56]mm · 3 of 75 slices shown]
[im 1/75]
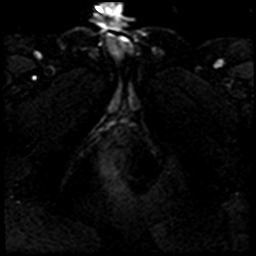
[im 38/75]
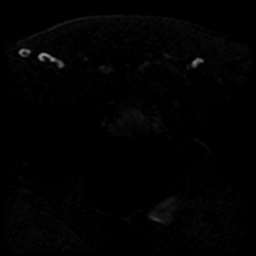
[im 75/75]
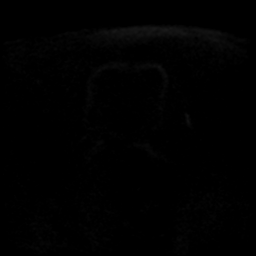

[Series 11: ax dwi_adc · axial · 3.0mm · 0.78mm/px · 1 of 25 slices shown]
[im 1/25]
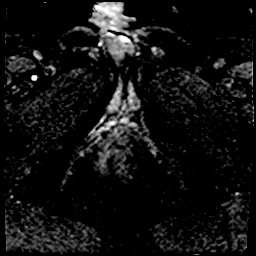

[Series 12: ax dwi_calc_bval · axial · 3.0mm · 0.78mm/px · 1 of 25 slices shown]
[im 1/25]
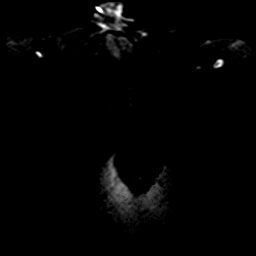

[Series 13: T1 · axial · 3.0mm · 1.15mm/px · 1 of 28 slices shown (3 of 28)]
[im 1/28]
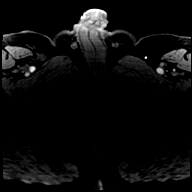

[Series 14: T1 · axial · 3.0mm · 1.15mm/px · 1 of 28 slices shown (4 of 28)]
[im 1/28]
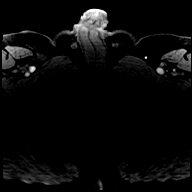

[Series 15: T1 · axial · 3.0mm · 1.15mm/px · 1 of 28 slices shown (5 of 28)]
[im 1/28]
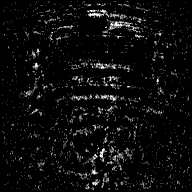

[Series 16: T1 · axial · 3.0mm · 1.15mm/px · 1 of 28 slices shown (6 of 28)]
[im 1/28]
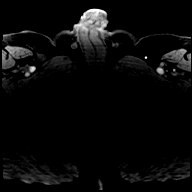

[Series 17: T1 · axial · 3.0mm · 1.15mm/px · 1 of 28 slices shown (7 of 28)]
[im 1/28]
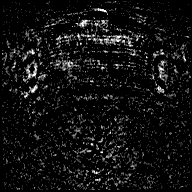

[Series 18: T1 · axial · 3.0mm · 1.15mm/px · 1 of 28 slices shown (8 of 28)]
[im 1/28]
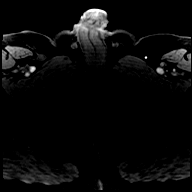

[Series 19: T1 · axial · 3.0mm · 1.15mm/px · 1 of 28 slices shown (9 of 28)]
[im 1/28]
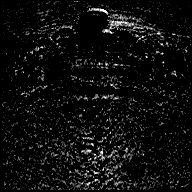

[Series 20: T1 · axial · 3.0mm · 1.15mm/px · 1 of 28 slices shown (10 of 28)]
[im 1/28]
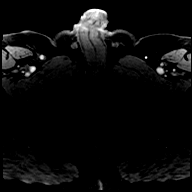

[Series 21: T1 · axial · 3.0mm · 1.15mm/px · 1 of 28 slices shown (11 of 28)]
[im 1/28]
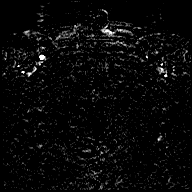

[Series 22: T1 · axial · 3.0mm · 1.15mm/px · 1 of 28 slices shown (12 of 28)]
[im 1/28]
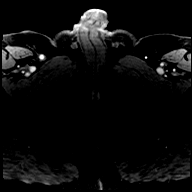

[Series 23: T1 · axial · 3.0mm · 1.15mm/px · 1 of 28 slices shown (13 of 28)]
[im 1/28]
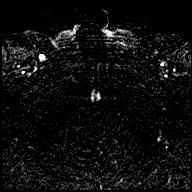

[Series 24: T1 · axial · 3.0mm · 1.15mm/px · 1 of 28 slices shown (14 of 28)]
[im 1/28]
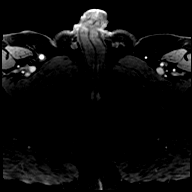

[Series 25: T1 · axial · 3.0mm · 1.15mm/px · 1 of 28 slices shown (15 of 28)]
[im 1/28]
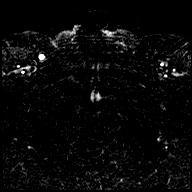

[Series 26: T1 · axial · 3.0mm · 1.15mm/px · 1 of 28 slices shown (16 of 28)]
[im 1/28]
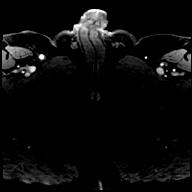

[Series 27: T1 · axial · 3.0mm · 1.15mm/px · 1 of 28 slices shown (17 of 28)]
[im 1/28]
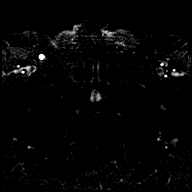

[Series 28: T1 · axial · 3.0mm · 1.15mm/px · 1 of 28 slices shown (18 of 28)]
[im 1/28]
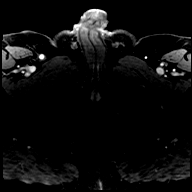

[Series 29: T1 · axial · 3.0mm · 1.15mm/px · 1 of 28 slices shown (19 of 28)]
[im 1/28]
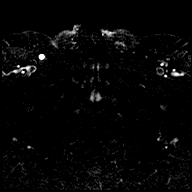

[Series 30: T1 · axial · 3.0mm · 1.15mm/px · 1 of 28 slices shown (20 of 28)]
[im 1/28]
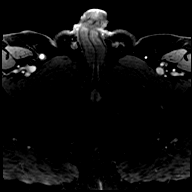

[Series 31: T1 · axial · 3.0mm · 1.15mm/px · 1 of 28 slices shown (21 of 28)]
[im 1/28]
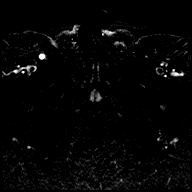

[Series 33: T1 · axial · 3.0mm · 1.15mm/px · 1 of 28 slices shown (22 of 28)]
[im 1/28]
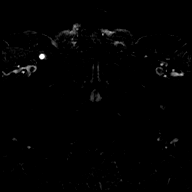

[Series 35: T1 · axial · 3.0mm · 1.15mm/px · 1 of 28 slices shown (23 of 28)]
[im 1/28]
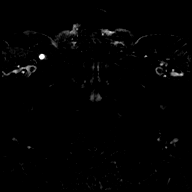

[Series 37: T1 · axial · 3.0mm · 1.15mm/px · 1 of 28 slices shown (24 of 28)]
[im 1/28]
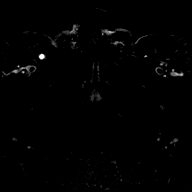

[Series 39: T1 · axial · 3.0mm · 1.15mm/px · 1 of 28 slices shown (25 of 28)]
[im 1/28]
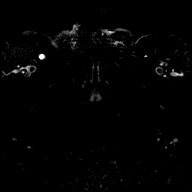

[Series 41: T1 · axial · 3.0mm · 1.15mm/px · 1 of 28 slices shown (26 of 28)]
[im 1/28]
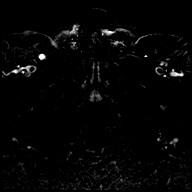

[Series 45: T1 · axial · 3.0mm · 1.15mm/px · 1 of 28 slices shown (27 of 28)]
[im 1/28]
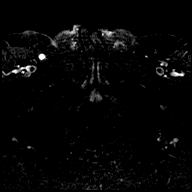

[Series 47: T1 · axial · 3.0mm · 1.15mm/px · 1 of 28 slices shown (28 of 28)]
[im 1/28]
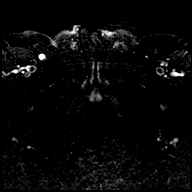

[48 of 48 positions shown; findings below may reference images not displayed]

FINDINGS: Prostate: No foci of restricted diffusion within the peripheral zone
(series 11). Mild heterogeneity of signal intensity within the
peripheral zone on T2 weighted imaging without focality.

The transitional zone demonstrates no suspicious nodularity.

Volume: 3.3 x 2.1 x 3.4 cm (volume = 12 cm^3)

Transcapsular spread:  Absent

Seminal vesicle involvement: Absent

Neurovascular bundle involvement: Absent

Pelvic adenopathy: Absent

Bone metastasis: Absent

Other findings: None
IMPRESSION: No evidence of high-grade carcinoma within the peripheral zone (
PI-RADS: 1.

## 2021-05-11 ENCOUNTER — Other Ambulatory Visit: Payer: Self-pay

## 2021-05-11 ENCOUNTER — Inpatient Hospital Stay
Admission: EM | Admit: 2021-05-11 | Discharge: 2021-05-15 | DRG: 271 | Disposition: E | Payer: Medicare Other | Attending: Internal Medicine | Admitting: Internal Medicine

## 2021-05-11 ENCOUNTER — Telehealth (INDEPENDENT_AMBULATORY_CARE_PROVIDER_SITE_OTHER): Payer: Self-pay

## 2021-05-11 DIAGNOSIS — I70221 Atherosclerosis of native arteries of extremities with rest pain, right leg: Principal | ICD-10-CM

## 2021-05-11 DIAGNOSIS — F32A Depression, unspecified: Secondary | ICD-10-CM | POA: Diagnosis present

## 2021-05-11 DIAGNOSIS — Z20822 Contact with and (suspected) exposure to covid-19: Secondary | ICD-10-CM | POA: Diagnosis present

## 2021-05-11 DIAGNOSIS — T82868A Thrombosis of vascular prosthetic devices, implants and grafts, initial encounter: Principal | ICD-10-CM | POA: Diagnosis present

## 2021-05-11 DIAGNOSIS — Z95820 Peripheral vascular angioplasty status with implants and grafts: Secondary | ICD-10-CM | POA: Diagnosis not present

## 2021-05-11 DIAGNOSIS — Z87891 Personal history of nicotine dependence: Secondary | ICD-10-CM

## 2021-05-11 DIAGNOSIS — I70421 Atherosclerosis of autologous vein bypass graft(s) of the extremities with rest pain, right leg: Secondary | ICD-10-CM | POA: Diagnosis present

## 2021-05-11 DIAGNOSIS — R110 Nausea: Secondary | ICD-10-CM | POA: Diagnosis not present

## 2021-05-11 DIAGNOSIS — I469 Cardiac arrest, cause unspecified: Secondary | ICD-10-CM | POA: Diagnosis not present

## 2021-05-11 DIAGNOSIS — I739 Peripheral vascular disease, unspecified: Secondary | ICD-10-CM | POA: Diagnosis not present

## 2021-05-11 DIAGNOSIS — N400 Enlarged prostate without lower urinary tract symptoms: Secondary | ICD-10-CM | POA: Diagnosis present

## 2021-05-11 DIAGNOSIS — I998 Other disorder of circulatory system: Secondary | ICD-10-CM | POA: Diagnosis not present

## 2021-05-11 DIAGNOSIS — Z8249 Family history of ischemic heart disease and other diseases of the circulatory system: Secondary | ICD-10-CM

## 2021-05-11 DIAGNOSIS — Z7901 Long term (current) use of anticoagulants: Secondary | ICD-10-CM | POA: Diagnosis not present

## 2021-05-11 DIAGNOSIS — I743 Embolism and thrombosis of arteries of the lower extremities: Secondary | ICD-10-CM | POA: Diagnosis present

## 2021-05-11 DIAGNOSIS — I10 Essential (primary) hypertension: Secondary | ICD-10-CM

## 2021-05-11 DIAGNOSIS — Z833 Family history of diabetes mellitus: Secondary | ICD-10-CM

## 2021-05-11 DIAGNOSIS — G8929 Other chronic pain: Secondary | ICD-10-CM | POA: Diagnosis present

## 2021-05-11 DIAGNOSIS — G629 Polyneuropathy, unspecified: Secondary | ICD-10-CM | POA: Diagnosis present

## 2021-05-11 DIAGNOSIS — E785 Hyperlipidemia, unspecified: Secondary | ICD-10-CM | POA: Diagnosis present

## 2021-05-11 DIAGNOSIS — M79604 Pain in right leg: Secondary | ICD-10-CM | POA: Diagnosis present

## 2021-05-11 DIAGNOSIS — Y832 Surgical operation with anastomosis, bypass or graft as the cause of abnormal reaction of the patient, or of later complication, without mention of misadventure at the time of the procedure: Secondary | ICD-10-CM | POA: Diagnosis present

## 2021-05-11 DIAGNOSIS — I70211 Atherosclerosis of native arteries of extremities with intermittent claudication, right leg: Secondary | ICD-10-CM | POA: Diagnosis not present

## 2021-05-11 DIAGNOSIS — Z885 Allergy status to narcotic agent status: Secondary | ICD-10-CM

## 2021-05-11 DIAGNOSIS — Z79899 Other long term (current) drug therapy: Secondary | ICD-10-CM | POA: Diagnosis not present

## 2021-05-11 LAB — COMPREHENSIVE METABOLIC PANEL
ALT: 30 U/L (ref 0–44)
AST: 37 U/L (ref 15–41)
Albumin: 4.8 g/dL (ref 3.5–5.0)
Alkaline Phosphatase: 85 U/L (ref 38–126)
Anion gap: 12 (ref 5–15)
BUN: 14 mg/dL (ref 8–23)
CO2: 23 mmol/L (ref 22–32)
Calcium: 9.8 mg/dL (ref 8.9–10.3)
Chloride: 102 mmol/L (ref 98–111)
Creatinine, Ser: 0.88 mg/dL (ref 0.61–1.24)
GFR, Estimated: >60 mL/min (ref >60)
Glucose, Bld: 107 mg/dL — ABNORMAL HIGH (ref 70–99)
Potassium: 4 mmol/L (ref 3.5–5.1)
Sodium: 137 mmol/L (ref 135–145)
Total Bilirubin: 0.8 mg/dL (ref 0.3–1.2)
Total Protein: 7.7 g/dL (ref 6.5–8.1)

## 2021-05-11 LAB — CBC WITH DIFFERENTIAL/PLATELET
Abs Immature Granulocytes: 0.05 10*3/uL (ref 0.00–0.07)
Basophils Absolute: 0 10*3/uL (ref 0.0–0.1)
Basophils Relative: 0 %
Eosinophils Absolute: 0.1 10*3/uL (ref 0.0–0.5)
Eosinophils Relative: 1 %
HCT: 46.2 % (ref 39.0–52.0)
Hemoglobin: 15.7 g/dL (ref 13.0–17.0)
Immature Granulocytes: 0 %
Lymphocytes Relative: 12 %
Lymphs Abs: 1.4 10*3/uL (ref 0.7–4.0)
MCH: 30.7 pg (ref 26.0–34.0)
MCHC: 34 g/dL (ref 30.0–36.0)
MCV: 90.4 fL (ref 80.0–100.0)
Monocytes Absolute: 0.9 10*3/uL (ref 0.1–1.0)
Monocytes Relative: 8 %
Neutro Abs: 9.5 10*3/uL — ABNORMAL HIGH (ref 1.7–7.7)
Neutrophils Relative %: 79 %
Platelets: 186 10*3/uL (ref 150–400)
RBC: 5.11 MIL/uL (ref 4.22–5.81)
RDW: 12.9 % (ref 11.5–15.5)
WBC: 11.9 10*3/uL — ABNORMAL HIGH (ref 4.0–10.5)
nRBC: 0 % (ref 0.0–0.2)

## 2021-05-11 LAB — PROTIME-INR
INR: 1.3 — ABNORMAL HIGH (ref 0.8–1.2)
Prothrombin Time: 16.2 seconds — ABNORMAL HIGH (ref 11.4–15.2)

## 2021-05-11 LAB — HEPARIN LEVEL (UNFRACTIONATED): Heparin Unfractionated: >1.1 IU/mL — ABNORMAL HIGH (ref 0.30–0.70)

## 2021-05-11 LAB — APTT: aPTT: 34 seconds (ref 24–36)

## 2021-05-11 MED ORDER — SODIUM CHLORIDE 0.9 % IV SOLN: INTRAVENOUS | Status: DC

## 2021-05-11 MED ORDER — ACETAMINOPHEN 325 MG PO TABS: 650.0000 mg | ORAL_TABLET | Freq: Four times a day (QID) | ORAL | Status: DC | PRN

## 2021-05-11 MED ORDER — HEPARIN (PORCINE) 25000 UT/250ML-% IV SOLN
1250.0000 [IU]/h | INTRAVENOUS | Status: DC
Start: 2021-05-11 — End: 2021-05-12
  Administered 2021-05-11: 21:00:00 1100 [IU]/h via INTRAVENOUS
  Filled 2021-05-11: qty 250

## 2021-05-11 MED ORDER — ENOXAPARIN SODIUM 40 MG/0.4ML IJ SOSY: 40.0000 mg | PREFILLED_SYRINGE | INTRAMUSCULAR | Status: DC

## 2021-05-11 MED ORDER — ONDANSETRON HCL 4 MG PO TABS: 4.0000 mg | ORAL_TABLET | Freq: Four times a day (QID) | ORAL | Status: DC | PRN

## 2021-05-11 MED ORDER — ONDANSETRON HCL 4 MG/2ML IJ SOLN
4.0000 mg | Freq: Four times a day (QID) | INTRAMUSCULAR | Status: DC | PRN
  Filled 2021-05-11: qty 2

## 2021-05-11 MED ORDER — TRAZODONE HCL 50 MG PO TABS: 25.0000 mg | ORAL_TABLET | Freq: Every evening | ORAL | Status: DC | PRN

## 2021-05-11 MED ORDER — MORPHINE SULFATE (PF) 2 MG/ML IV SOLN
2.0000 mg | INTRAVENOUS | Status: DC | PRN
  Administered 2021-05-12: 19:00:00 2 mg via INTRAVENOUS
  Filled 2021-05-11: qty 1

## 2021-05-11 MED ORDER — MAGNESIUM HYDROXIDE 400 MG/5ML PO SUSP: 30.0000 mL | Freq: Every day | ORAL | Status: DC | PRN

## 2021-05-11 MED ORDER — ACETAMINOPHEN 650 MG RE SUPP: 650.0000 mg | Freq: Four times a day (QID) | RECTAL | Status: DC | PRN

## 2021-05-11 MED ORDER — HEPARIN BOLUS VIA INFUSION
2100.0000 [IU] | Freq: Once | INTRAVENOUS | Status: AC
  Administered 2021-05-11: 21:00:00 2100 [IU] via INTRAVENOUS
  Filled 2021-05-11: qty 2100

## 2021-05-11 NOTE — Progress Notes (Signed)
ANTICOAGULATION CONSULT NOTE - Initial Consult  Pharmacy Consult for IV heparin Indication:  limb ischemia  Allergies  Allergen Reactions   Hydrocodone Swelling    Patient Measurements: Height: 5\' 8"  (172.7 cm) Weight: 70.3 kg (155 lb) IBW/kg (Calculated) : 68.4  Vital Signs: Temp: 98.3 F (36.8 C) (12/28 1710) Temp Source: Oral (12/28 1710) BP: 142/81 (12/28 1821) Pulse Rate: 72 (12/28 1821)  Labs: Recent Labs    05/06/2021 1723  HGB 15.7  HCT 46.2  PLT 186  LABPROT 16.2*  INR 1.3*  CREATININE 0.88    Estimated Creatinine Clearance: 81 mL/min (by C-G formula based on SCr of 0.88 mg/dL).   Medical History: Past Medical History:  Diagnosis Date   Collagen vascular disease (HCC)    Hyperlipidemia    Hypertension    Peripheral vascular disease (HCC)      Assessment: 65YOM cc pain and decreased circulation to right foot. PMH PVD s/p bypass  On anticoagulation at home with apixaban 5 mg BID. Last dose of Eliquis today (12/28) @1300 . Last dose within 12 hours.  Goal of Therapy:  aPTT 66-102 seconds Monitor platelets by anticoagulation protocol: Yes    Plan:  Give heparin bolus 2100 units Start heparin infusion at 1100 units/hr aPTT and HL in 6 hours. Titrating via aPTT until HL correlates. Checking HL at least daily. Daily CBC.    06-28-1978, PharmD Pharmacy Resident  04/16/2021 6:57 PM

## 2021-05-11 NOTE — ED Provider Notes (Signed)
Emergency Medicine Provider Triage Evaluation Note  CODEE TUTSON , a 65 y.o. male  was evaluated in triage.  Pt complains of pain and decreased circulation to right foot. Foot feels cold and is more pale than the other.  History of PVD.   Review of Systems  Positive: Decreased circulation to right foot Negative: Fever, wound  Physical Exam  BP 140/75 (BP Location: Right Arm)    Pulse 75    Temp 98.3 F (36.8 C) (Oral)    Resp 18    SpO2 96%  Gen:   Awake, no distress   Resp:  Normal effort  MSK:   Moves extremities without difficulty  Other:    Medical Decision Making  Medically screening exam initiated at 5:16 PM.  Appropriate orders placed.  Jacquelynn Cree was informed that the remainder of the evaluation will be completed by another provider, this initial triage assessment does not replace that evaluation, and the importance of remaining in the ED until their evaluation is complete.  No DP or PT pulse with bedside doppler.    Chinita Pester, FNP 04/21/2021 1726    Merwyn Katos, MD 05/05/2021 2116

## 2021-05-11 NOTE — ED Triage Notes (Signed)
First nurse Note:  C/O right leg pain and foot cool to touch since last night.  States had fem/pop bypass 5 years ago and stent placed.

## 2021-05-11 NOTE — ED Triage Notes (Signed)
Pt c/o right foot/leg pain that started last with tingling. Had stent placed before. States was pale last night. Right foot cool. Unable to find pedal pulses with doppler  Cari NP MSE in triage

## 2021-05-11 NOTE — Telephone Encounter (Signed)
Received a message from Travis Salas's wife who states they need advice on whether or not Cala Bradford should take Travis Salas to the ED as he is having a circulation issue in his foot again.

## 2021-05-11 NOTE — Telephone Encounter (Signed)
I called the patient and reiterated that he needed to go to the ED for evaluation as was told to him by Dr. Wyn Quaker last night. Patient seemed reluctant but understood.

## 2021-05-11 NOTE — H&P (Signed)
[place]   PATIENT NAME: Travis Salas    MR#:  [id number]  DATE OF BIRTH:  07-15-1955  DATE OF ADMISSION:  Travis Salas 22, 2023  PRIMARY CARE PHYSICIAN: Travis Shaggy, MD   Patient is coming from: Home  REQUESTING/REFERRING PHYSICIAN: Willy Eddy, MD  CHIEF COMPLAINT:   Chief Complaint  Patient presents with   Leg Pain    HISTORY OF PRESENT ILLNESS:  Travis Salas is a 65 y.o. male with medical history significant for hypertension, dyslipidemia, peripheral vascular disease status post femoral-popliteal bypass on Eliquis and collagen vascular disease, who presented to the ER with acute onset of right leg and foot pain that started last night.  She noted her right foot was colder to touch and paler than usual.  No leg or foot discoloration.  She has been compliant with her medications and Eliquis.  She denied any chest pain or palpitations.  No cough or wheezing or hemoptysis.  No fever or chills.  The patient is an ex-smoker since 2016.  ED Course: Upon presentation to the emergency room, vital signs were within normal.  Labs revealed mild leukocytosis of 11.9 with neutrophilia and unremarkable CMP.  INR was 1.3 and PT 16.2.    EKG as reviewed by me : EKG showed normal sinus rhythm with a rate of 84 with prolonged QT interval with QTC of 484 MS and poor R wave progression.  Dr. Wyn Quaker was contacted and the patient is expected to have an angiography in a.m.  He was started on IV heparin.  He will be admitted to a medical bed for further evaluation and management.  PAST MEDICAL HISTORY:   Past Medical History:  Diagnosis Date   Collagen vascular disease (HCC)    Hyperlipidemia    Hypertension    Peripheral vascular disease (HCC)     PAST SURGICAL HISTORY:   Past Surgical History:  Procedure Laterality Date   BYPASS GRAFT ANGIOGRAPHY Right 11/21/2019   Procedure: BYPASS GRAFT ANGIOGRAPHY;  Surgeon: Griffith Citron, MD;  Location: [facility];  Service:  Cardiovascular;  Laterality: Right;   CENTRAL LINE INSERTION Left 01/07/2020   Procedure: CENTRAL LINE INSERTION;  Surgeon: Annice Needy, MD;  Location: [facility];  Service: Vascular;  Laterality: Left;   ENDARTERECTOMY FEMORAL Right 12/23/2015   Procedure: COMMON, FEMORAL, SUPERFICIAL, AND PROFUNDA ENDARTERECTOMY;  Surgeon: Annice Needy, MD;  Location: [facility];  Service: Vascular;  Laterality: Right;   FEMORAL-TIBIAL BYPASS GRAFT Right 12/23/2015   Procedure: BYPASS GRAFT FEMORAL-TIBIAL ARTERY, COMMON FEMORAL AND PROFUNDIS FEMORAL WITH PATCH ANGIOPLASTY, POPLITEAL ANTERIOR ARTERY BYPASS, ILIOFEMORAL ANGIOGRAM VIABHAM STENT 8 MM DIAMETER X 5 CM LENGTH;  Surgeon: Annice Needy, MD;  Location: [facility];  Service: Vascular;  Laterality: Right;   INSERTION OF ILIAC STENT Right 01/07/2020   Procedure: INSERTION OF ILIAC STENT;  Surgeon: Annice Needy, MD;  Location: [facility];  Service: Vascular;  Laterality: Right;   LOWER EXTREMITY ANGIOGRAM Right 01/07/2020   Procedure: LOWER EXTREMITY ANGIOGRAM  insertion of infusion catheter ;  Surgeon: Annice Needy, MD;  Location: [facility];  Service: Vascular;  Laterality: Right;   LOWER EXTREMITY ANGIOGRAPHY Right 06/19/2019   Procedure: LOWER EXTREMITY ANGIOGRAPHY;  Surgeon: Annice Needy, MD;  Location: [facility];  Service: Cardiovascular;  Laterality: Right;   LOWER EXTREMITY ANGIOGRAPHY Right 06/20/2019   Procedure: Lower Extremity Angiography;  Surgeon: Annice Needy, MD;  Location: [facility];  Service: Cardiovascular;  Laterality: Right;   LOWER EXTREMITY ANGIOGRAPHY Right 01/08/2020   Procedure: Lower Extremity Angiography;  Surgeon: Annice Needy, MD;  Location: [facility];  Service: Cardiovascular;  Laterality: Right;   PERIPHERAL VASCULAR CATHETERIZATION Right 11/08/2015   Procedure: Lower Extremity Angiography;  Surgeon: Annice Needy, MD;  Location: [facility];  Service: Cardiovascular;  Laterality: Right;   PERIPHERAL VASCULAR  CATHETERIZATION Right 12/13/2015   Procedure: Lower Extremity Angiography;  Surgeon: Annice Needy, MD;  Location: [facility];  Service: Cardiovascular;  Laterality: Right;   PERIPHERAL VASCULAR THROMBECTOMY Right 11/21/2019   Procedure: PERIPHERAL VASCULAR THROMBECTOMY;  Surgeon: Griffith Citron, MD;  Location: [facility];  Service: Cardiovascular;  Laterality: Right;   PERIPHERAL VASCULAR THROMBECTOMY Right 11/22/2019   Procedure: PERIPHERAL VASCULAR THROMBECTOMY;  Surgeon: Griffith Citron, MD;  Location: [facility];  Service: Cardiovascular;  Laterality: Right;    SOCIAL HISTORY:   Social History   Tobacco Use   Smoking status: Former    Packs/day: 1.50    Years: 46.00    Pack years: 69.00    Types: Cigarettes    Quit date: 02/05/2015    Years since quitting: 6.2   Smokeless tobacco: Never  Substance Use Topics   Alcohol use: Yes    Alcohol/week: 7.0 standard drinks    Types: 7 Cans of beer per week    FAMILY HISTORY:   Family History  Problem Relation Age of Onset   Diabetes Mother    Hypertension Brother     DRUG ALLERGIES:   Allergies  Allergen Reactions   Hydrocodone Swelling    REVIEW OF SYSTEMS:   ROS As per history of present illness. All pertinent systems were reviewed above. Constitutional, HEENT, cardiovascular, respiratory, GI, GU, musculoskeletal, neuro, psychiatric, endocrine, integumentary and hematologic systems were reviewed and are otherwise negative/unremarkable except for positive findings mentioned above in the HPI.   MEDICATIONS AT HOME:   Prior to Admission medications   Medication Sig Start Date End Date Taking? Authorizing Provider  acetaminophen (TYLENOL) 500 MG tablet Take 500 mg by mouth every 6 (six) hours as needed for mild pain or moderate pain.   Yes [provider]  amLODipine-benazepril (LOTREL) 5-20 MG capsule Take 1 capsule by mouth 2 (two) times daily. 08/16/20  Yes [provider]  apixaban  (ELIQUIS) 5 MG TABS tablet Take 1 tablet (5 mg total) by mouth 2 (two) times daily. 02/25/21  Yes Georgiana Spinner, NP  furosemide (LASIX) 20 MG tablet TK 1 T PO QD PRN. 12/04/16  Yes [provider]  Multiple Vitamin (MULTIVITAMIN WITH MINERALS) TABS tablet Take 1 tablet by mouth every evening.    Yes [provider]  rosuvastatin (CRESTOR) 20 MG tablet Take 20 mg by mouth daily.   Yes [provider]  sertraline (ZOLOFT) 100 MG tablet TK 1 T PO QD 11/15/17  Yes [provider]  tamsulosin (FLOMAX) 0.4 MG CAPS capsule Take 0.4 mg by mouth daily. 06/05/19  Yes [provider]  aspirin EC 81 MG tablet Take 1 tablet (81 mg total) by mouth daily. Swallow whole. Patient not taking: Reported on 06/06/2021 01/09/20   Stegmayer, Cala Bradford A, PA-C  ciclopirox (PENLAC) 8 % solution APPLY OVER NAIL AND SURROUNDING SKIN DAILY AT BEDTIME OVER PREVIOUS COAT. AFTER 7 DAYS, MAY REMOVE WITH ALCOHOL AND CONTINUE CYCLE Patient not taking: Reported on 2021/06/06 10/24/19   Candelaria Stagers, DPM  gabapentin (NEURONTIN) 300 MG capsule TAKE  1 CAPSULE BY MOUTH AT BEDTIME Patient not taking: Reported on 04/25/2021 11/06/16   Stegmayer, Cala Bradford A, PA-C  Omega-3 Fatty Acids (FISH OIL) 1200 MG CAPS Take 2,400 mg by mouth 2 (two) times daily. Patient not taking: Reported on 04/20/2021    [provider]  oxyCODONE (OXY IR/ROXICODONE) 5 MG immediate release tablet Take 1 tablet (5 mg total) by mouth every 6 (six) hours as needed for severe pain. Patient not taking: Reported on 05/06/2021 01/09/20   Stegmayer, Ranae Plumber, PA-C  Specialty Vitamins Products (ONE-A-DAY PROSTATE PO) Take 1 tablet by mouth daily. "Super Beta Prostate"  Patient not taking: Reported on 04/24/2021    [provider]  Zn-Pyg Afri-Nettle-Saw Palmet (SAW PALMETTO COMPLEX PO) Take 2 tablets by mouth daily.     [provider]      VITAL SIGNS:  Blood pressure (!) 142/81, pulse 72,  temperature 98.3 F (36.8 C), temperature source Oral, resp. rate 18, height  (1.727 m), weight 70.3 kg, SpO2 97 %.  PHYSICAL EXAMINATION:  Physical Exam  GENERAL:  65 y.o.-year-old Caucasian male patient lying in the bed with no acute distress.  EYES: Pupils equal, round, reactive to light and accommodation. No scleral icterus. Extraocular muscles intact.  HEENT: Head atraumatic, normocephalic. Oropharynx and nasopharynx clear.  NECK:  Supple, no jugular venous distention. No thyroid enlargement, no tenderness.  LUNGS: Normal breath sounds bilaterally, no wheezing, rales,rhonchi or crepitation. No use of accessory muscles of respiration.  CARDIOVASCULAR: Regular rate and rhythm, S1, S2 normal. No murmurs, rubs, or gallops.  Right lower extremity is cool to touch below the ankle with absent PT and DP pulses and earlier Doppler signals.  There was positive delayed capillary refill. ABDOMEN: Soft, nondistended, nontender. Bowel sounds present. No organomegaly or mass.  EXTREMITIES: No pedal edema, cyanosis, or clubbing.  NEUROLOGIC: Cranial nerves II through XII are intact. Muscle strength 5/5 in all extremities. Sensation intact. Gait not checked.  PSYCHIATRIC: The patient is alert and oriented x 3.  Normal affect and good eye contact. SKIN: No obvious rash, lesion, or ulcer.   LABORATORY PANEL:   CBC Recent Labs  Lab 05/04/2021 1723  WBC 11.9*  HGB 15.7  HCT 46.2  PLT 186   ------------------------------------------------------------------------------------------------------------------  Chemistries  Recent Labs  Lab 04/25/2021 1723  NA 137  K 4.0  CL 102  CO2 23  GLUCOSE 107*  BUN 14  CREATININE 0.88  CALCIUM 9.8  AST 37  ALT 30  ALKPHOS 85  BILITOT 0.8   ------------------------------------------------------------------------------------------------------------------  Cardiac Enzymes No results for input(s): TROPONINI in the last 168  hours. ------------------------------------------------------------------------------------------------------------------  RADIOLOGY:  No results found.    IMPRESSION AND PLAN:  Principal Problem:   Pain of right lower extremity due to ischemia  1.  Critical right lower extremity limb ischemia with severe peripheral vascular disease status post femoropopliteal bypass, with subsequent intermittent claudications. - The patient will be admitted to a medical bed. - We will continue him on IV heparin drip, holding Eliquis. - Vascular surgery consult to be obtained. - Dr. Wyn Quaker was notified and is aware about the patient. - He will plan on angiography in AM. - Pain management to be provided.  2.  Essential hypertension. - We will continue Lotrel.  3.  Dyslipidemia. - We will continue statin therapy.  4.  Depression. - We will continue Zoloft.  5.  BPH. - We will continue Flomax.  6.  Peripheral neuropathy. - We will continue Neurontin.  DVT prophylaxis:  IV heparin. Code Status: full code. Family Communication:  The plan of care was discussed in details with the patient (and family). I answered all questions. The patient agreed to proceed with the above mentioned plan. Further management will depend upon hospital course. Disposition Plan: Back to previous home environment Consults called: Vascular surgery. All the records are reviewed and case discussed with ED provider.  Status is: Inpatient At the time of the admission, it appears that the appropriate admission status for this patient is inpatient.  This is judged to be reasonable and necessary in order to provide the required intensity of service to ensure the patient's safety given the presenting symptoms, physical exam findings and initial radiographic and laboratory data in the context of comorbid conditions.  The patient requires inpatient status due to high intensity of service, high risk of further deterioration and high  frequency of surveillance required.  I certify that at the time of admission, it is my clinical judgment that the patient will require inpatient hospital care extending more than 2 midnights.    Dispo: The patient is from: Home              Anticipated d/c is to: Home              Patient currently is not medically stable to d/c.              Difficult to place patient: No     Hannah Beat M.D on 18-Travis Salas-2023 at 8:08 PM  [facility]   From 7 PM-7 AM, contact night-coverage [email or web address]  CC: Primary care physician; Travis Shaggy, MD

## 2021-05-11 NOTE — ED Provider Notes (Signed)
[facility] Emergency Department Provider Note    Event Date/Time   First MD Initiated Contact with Patient 06/02/2021 1752     (approximate)  I have reviewed the triage vital signs and the nursing notes.   HISTORY  Chief Complaint Leg Pain    HPI Travis Salas is a 65 y.o. male with extensive history of PVD status post femoropopliteal bypass on Eliquis presents to the ER for acute pain of his right lower leg and foot starting last night.  Noticed that his right foot was colder to touch and was paler than usual.  He does not currently smoke.  Has been compliant with his medications.  Denies any injury.  No fevers chills or recent illness.  Past Medical History:  Diagnosis Date   Collagen vascular disease (HCC)    Hyperlipidemia    Hypertension    Peripheral vascular disease (HCC)    Family History  Problem Relation Age of Onset   Diabetes Mother    Hypertension Brother    Past Surgical History:  Procedure Laterality Date   BYPASS GRAFT ANGIOGRAPHY Right 11/21/2019   Procedure: BYPASS GRAFT ANGIOGRAPHY;  Surgeon: Griffith Citron, MD;  Location: [facility];  Service: Cardiovascular;  Laterality: Right;   CENTRAL LINE INSERTION Left 01/07/2020   Procedure: CENTRAL LINE INSERTION;  Surgeon: Annice Needy, MD;  Location: [facility];  Service: Vascular;  Laterality: Left;   ENDARTERECTOMY FEMORAL Right 12/23/2015   Procedure: COMMON, FEMORAL, SUPERFICIAL, AND PROFUNDA ENDARTERECTOMY;  Surgeon: Annice Needy, MD;  Location: [facility];  Service: Vascular;  Laterality: Right;   FEMORAL-TIBIAL BYPASS GRAFT Right 12/23/2015   Procedure: BYPASS GRAFT FEMORAL-TIBIAL ARTERY, COMMON FEMORAL AND PROFUNDIS FEMORAL WITH PATCH ANGIOPLASTY, POPLITEAL ANTERIOR ARTERY BYPASS, ILIOFEMORAL ANGIOGRAM VIABHAM STENT 8 MM DIAMETER X 5 CM LENGTH;  Surgeon: Annice Needy, MD;  Location: [facility];  Service: Vascular;  Laterality: Right;   INSERTION OF ILIAC STENT Right 01/07/2020    Procedure: INSERTION OF ILIAC STENT;  Surgeon: Annice Needy, MD;  Location: [facility];  Service: Vascular;  Laterality: Right;   LOWER EXTREMITY ANGIOGRAM Right 01/07/2020   Procedure: LOWER EXTREMITY ANGIOGRAM  insertion of infusion catheter ;  Surgeon: Annice Needy, MD;  Location: [facility];  Service: Vascular;  Laterality: Right;   LOWER EXTREMITY ANGIOGRAPHY Right 06/19/2019   Procedure: LOWER EXTREMITY ANGIOGRAPHY;  Surgeon: Annice Needy, MD;  Location: [facility];  Service: Cardiovascular;  Laterality: Right;   LOWER EXTREMITY ANGIOGRAPHY Right 06/20/2019   Procedure: Lower Extremity Angiography;  Surgeon: Annice Needy, MD;  Location: [facility];  Service: Cardiovascular;  Laterality: Right;   LOWER EXTREMITY ANGIOGRAPHY Right 01/08/2020   Procedure: Lower Extremity Angiography;  Surgeon: Annice Needy, MD;  Location: [facility];  Service: Cardiovascular;  Laterality: Right;   PERIPHERAL VASCULAR CATHETERIZATION Right 11/08/2015   Procedure: Lower Extremity Angiography;  Surgeon: Annice Needy, MD;  Location: [facility];  Service: Cardiovascular;  Laterality: Right;   PERIPHERAL VASCULAR CATHETERIZATION Right 12/13/2015   Procedure: Lower Extremity Angiography;  Surgeon: Annice Needy, MD;  Location: [facility];  Service: Cardiovascular;  Laterality: Right;   PERIPHERAL VASCULAR THROMBECTOMY Right 11/21/2019   Procedure: PERIPHERAL VASCULAR THROMBECTOMY;  Surgeon: Griffith Citron, MD;  Location: [facility];  Service: Cardiovascular;  Laterality: Right;   PERIPHERAL VASCULAR THROMBECTOMY Right 11/22/2019   Procedure: PERIPHERAL VASCULAR THROMBECTOMY;  Surgeon: Griffith Citron, MD;  Location: [facility];  Service: Cardiovascular;  Laterality: Right;   Patient Active Problem List   Diagnosis Date Noted   Ischemia of extremity 01/07/2020   Lower limb ischemia 11/22/2019   Limb ischemia 06/19/2019   Hypertension 12/20/2016   Hyperlipidemia  06/13/2016   Swelling of limb 06/13/2016   Atherosclerosis of native arteries of extremity with intermittent claudication (HCC) 06/13/2016   Ischemic leg 12/13/2015      Prior to Admission medications   Medication Sig Start Date End Date Taking? Authorizing Provider  amLODipine-benazepril (LOTREL) 5-20 MG capsule Take 1 capsule by mouth 2 (two) times daily. 08/16/20   [provider]  apixaban (ELIQUIS) 5 MG TABS tablet Take 1 tablet (5 mg total) by mouth 2 (two) times daily. 02/25/21   Georgiana Spinner, NP  aspirin EC 81 MG tablet Take 1 tablet (81 mg total) by mouth daily. Swallow whole. 01/09/20   Stegmayer, Cala Bradford A, PA-C  ciclopirox (PENLAC) 8 % solution APPLY OVER NAIL AND SURROUNDING SKIN DAILY AT BEDTIME OVER PREVIOUS COAT. AFTER 7 DAYS, MAY REMOVE WITH ALCOHOL AND CONTINUE CYCLE 10/24/19   Candelaria Stagers, DPM  furosemide (LASIX) 20 MG tablet TK 1 T PO QD PRN. 12/04/16   [provider]  gabapentin (NEURONTIN) 300 MG capsule TAKE 1 CAPSULE BY MOUTH AT BEDTIME 11/06/16   Stegmayer, Cala Bradford A, PA-C  Multiple Vitamin (MULTIVITAMIN WITH MINERALS) TABS tablet Take 1 tablet by mouth every evening.     [provider]  Omega-3 Fatty Acids (FISH OIL) 1200 MG CAPS Take 2,400 mg by mouth 2 (two) times daily.    [provider]  oxyCODONE (OXY IR/ROXICODONE) 5 MG immediate release tablet Take 1 tablet (5 mg total) by mouth every 6 (six) hours as needed for severe pain. 01/09/20   Stegmayer, Cala Bradford A, PA-C  rosuvastatin (CRESTOR) 20 MG tablet Take 20 mg by mouth daily.    [provider]  sertraline (ZOLOFT) 100 MG tablet TK 1 T PO QD 11/15/17   [provider]  Specialty Vitamins Products (ONE-A-DAY PROSTATE PO) Take 1 tablet by mouth daily. "Super Beta Prostate"     [provider]  tamsulosin (FLOMAX) 0.4 MG CAPS capsule Take 0.4 mg by mouth daily. 06/05/19   [provider]  Zn-Pyg Afri-Nettle-Saw Palmet (SAW PALMETTO COMPLEX  PO) Take 2 tablets by mouth daily.     [provider]    Allergies Hydrocodone    Social History Social History   Tobacco Use   Smoking status: Former    Packs/day: 1.50    Years: 46.00    Pack years: 69.00    Types: Cigarettes    Quit date: 02/05/2015    Years since quitting: 6.2   Smokeless tobacco: Never  Vaping Use   Vaping Use: Never used  Substance Use Topics   Alcohol use: Yes    Alcohol/week: 7.0 standard drinks    Types: 7 Cans of beer per week   Drug use: Yes    Types: Marijuana    Comment: occasional    Review of Systems Patient denies headaches, rhinorrhea, blurry vision, numbness, shortness of breath, chest pain, edema, cough, abdominal pain, nausea, vomiting, diarrhea, dysuria, fevers, rashes or hallucinations unless otherwise stated above in HPI. ____________________________________________   PHYSICAL EXAM:  VITAL SIGNS: Vitals:   04/17/2021 1710 05/05/2021 1821  BP: 140/75 (!) 142/81  Pulse: 75 72  Resp: 18 18  Temp: 98.3 F (36.8 C)   SpO2: 96% 97%  Constitutional: Alert and oriented.  Eyes: Conjunctivae are normal.  Head: Atraumatic. Nose: No congestion/rhinnorhea. Mouth/Throat: Mucous membranes are moist.   Neck: No stridor. Painless ROM.  Cardiovascular: Normal rate, regular rhythm. Grossly normal heart sounds.  Good peripheral circulation. Respiratory: Normal respiratory effort.  No retractions. Lungs CTAB. Gastrointestinal: Soft and nontender. No distention. No abdominal bruits. No CVA tenderness. Genitourinary:  Musculoskeletal: Right lower extremity is cool to touch below the ankle with absent PT or DP Doppler signals.  More proximal extremity is warm.  Delayed cap refill.  No groin swelling or pulsatile mass. Neurologic:  Normal speech and language. No gross focal neurologic deficits are appreciated. No facial droop Skin:  Skin is warm, dry and intact. No rash noted. Psychiatric: Mood and affect are normal. Speech and  behavior are normal.  ____________________________________________   LABS (all labs ordered are listed, but only abnormal results are displayed)  Results for orders placed or performed during the hospital encounter of 05/10/2021 (from the past 24 hour(s))  Comprehensive metabolic panel     Status: Abnormal   Collection Time: 04/22/2021  5:23 PM  Result Value Ref Range   Sodium 137 135 - 145 mmol/L   Potassium 4.0 3.5 - 5.1 mmol/L   Chloride 102 98 - 111 mmol/L   CO2 23 22 - 32 mmol/L   Glucose, Bld 107 (H) 70 - 99 mg/dL   BUN 14 8 - 23 mg/dL   Creatinine, Ser 1.61 0.61 - 1.24 mg/dL   Calcium 9.8 8.9 - 09.6 mg/dL   Total Protein 7.7 6.5 - 8.1 g/dL   Albumin 4.8 3.5 - 5.0 g/dL   AST 37 15 - 41 U/L   ALT 30 0 - 44 U/L   Alkaline Phosphatase 85 38 - 126 U/L   Total Bilirubin 0.8 0.3 - 1.2 mg/dL   GFR, Estimated >04 >54 mL/min   Anion gap 12 5 - 15  CBC with Differential     Status: Abnormal   Collection Time: 05/10/2021  5:23 PM  Result Value Ref Range   WBC 11.9 (H) 4.0 - 10.5 K/uL   RBC 5.11 4.22 - 5.81 MIL/uL   Hemoglobin 15.7 13.0 - 17.0 g/dL   HCT 09.8 11.9 - 14.7 %   MCV 90.4 80.0 - 100.0 fL   MCH 30.7 26.0 - 34.0 pg   MCHC 34.0 30.0 - 36.0 g/dL   RDW 82.9 56.2 - 13.0 %   Platelets 186 150 - 400 K/uL   nRBC 0.0 0.0 - 0.2 %   Neutrophils Relative % 79 %   Neutro Abs 9.5 (H) 1.7 - 7.7 K/uL   Lymphocytes Relative 12 %   Lymphs Abs 1.4 0.7 - 4.0 K/uL   Monocytes Relative 8 %   Monocytes Absolute 0.9 0.1 - 1.0 K/uL   Eosinophils Relative 1 %   Eosinophils Absolute 0.1 0.0 - 0.5 K/uL   Basophils Relative 0 %   Basophils Absolute 0.0 0.0 - 0.1 K/uL   Immature Granulocytes 0 %   Abs Immature Granulocytes 0.05 0.00 - 0.07 K/uL  Protime-INR     Status: Abnormal   Collection Time: 04/24/2021  5:23 PM  Result Value Ref Range   Prothrombin Time 16.2 (H) 11.4 - 15.2 seconds   INR 1.3 (H) 0.8 - 1.2   ____________________________________________  EKG My review and personal  interpretation at Time: 18:53   Indication: limb ischemia  Rate: 85  Rhythm: sinus Axis: normal Other: normal intervals, no stemi, nonspecific st abn c/w  previous ____________________________________________  RADIOLOGY   ____________________________________________   PROCEDURES  Procedure(s) performed:  .Critical Care Performed by: Willy Eddy, MD Authorized by: Willy Eddy, MD   Critical care provider statement:    Critical care time (minutes):  35   Critical care was necessary to treat or prevent imminent or life-threatening deterioration of the following conditions:  Circulatory failure   Critical care was time spent personally by me on the following activities:  Ordering and performing treatments and interventions, ordering and review of laboratory studies, ordering and review of radiographic studies, pulse oximetry, re-evaluation of patient's condition, review of old charts, obtaining history from patient or surrogate, examination of patient, evaluation of patient's response to treatment, discussions with primary provider, discussions with consultants and development of treatment plan with patient or surrogate    Critical Care performed: yes ____________________________________________   INITIAL IMPRESSION / ASSESSMENT AND PLAN / ED COURSE  Pertinent labs & imaging results that were available during my care of the patient were reviewed by me and considered in my medical decision making (see chart for details).   DDX: Claudication, critical limb ischemia rest pain, dissection, aneurysm, embolic phenomenon  Travis Salas is a 65 y.o. who presents to the ED with presents to the ER with rest pain consistent with critical limb ischemia and patient with extensive history of PVD status post bypass.  Pain is only mild but patient does have poor perfusion to the right lower extremity.  Will consult with vascular surgery.  Plan to heparinize.  patient will require  hospitalization  Clinical Course as of May 19, 2021 1904  Wed 05-19-2021  1840 Case discussed in consultation with Dr. Wyn Quaker who is familiar with patient was expecting him to come in we will plan for angiogram, agrees with plan for heparinization.  Will discuss with hospitalist for admission.  Patient agreeable to plan. [PR]    Clinical Course User Index [PR] Willy Eddy, MD    The patient was evaluated in Emergency Department today for the symptoms described in the history of present illness. He/she was evaluated in the context of the global COVID-19 pandemic, which necessitated consideration that the patient might be at risk for infection with the SARS-CoV-2 virus that causes COVID-19. Institutional protocols and algorithms that pertain to the evaluation of patients at risk for COVID-19 are in a state of rapid change based on information released by regulatory bodies including the CDC and federal and state organizations. These policies and algorithms were followed during the patient's care in the ED.  As part of my medical decision making, I reviewed the following data within the electronic MEDICAL RECORD NUMBER Nursing notes reviewed and incorporated, Labs reviewed, notes from prior ED visits and [place] Controlled Substance Database   ____________________________________________   FINAL CLINICAL IMPRESSION(S) / ED DIAGNOSES  Final diagnoses:  Critical limb ischemia of right lower extremity with rest pain (HCC)      NEW MEDICATIONS STARTED DURING THIS VISIT:  New Prescriptions   No medications on file     Note:  This document was prepared using Dragon voice recognition software and may include unintentional dictation errors.    Willy Eddy, MD 05-19-21 [phone]

## 2021-05-12 ENCOUNTER — Encounter: Payer: Self-pay | Admitting: Internal Medicine

## 2021-05-12 ENCOUNTER — Encounter: Admission: EM | Disposition: E | Payer: Self-pay | Source: Home / Self Care | Attending: Internal Medicine

## 2021-05-12 DIAGNOSIS — I70221 Atherosclerosis of native arteries of extremities with rest pain, right leg: Secondary | ICD-10-CM

## 2021-05-12 DIAGNOSIS — I70211 Atherosclerosis of native arteries of extremities with intermittent claudication, right leg: Secondary | ICD-10-CM

## 2021-05-12 DIAGNOSIS — I998 Other disorder of circulatory system: Secondary | ICD-10-CM

## 2021-05-12 HISTORY — PX: LOWER EXTREMITY INTERVENTION: CATH118252

## 2021-05-12 HISTORY — PX: LOWER EXTREMITY ANGIOGRAPHY: CATH118251

## 2021-05-12 LAB — CBC
HCT: 39.8 % (ref 39.0–52.0)
Hemoglobin: 13.6 g/dL (ref 13.0–17.0)
MCH: 30.8 pg (ref 26.0–34.0)
MCHC: 34.2 g/dL (ref 30.0–36.0)
MCV: 90.2 fL (ref 80.0–100.0)
Platelets: 159 10*3/uL (ref 150–400)
RBC: 4.41 MIL/uL (ref 4.22–5.81)
RDW: 12.7 % (ref 11.5–15.5)
WBC: 8.5 10*3/uL (ref 4.0–10.5)
nRBC: 0 % (ref 0.0–0.2)

## 2021-05-12 LAB — BASIC METABOLIC PANEL
Anion gap: 7 (ref 5–15)
BUN: 15 mg/dL (ref 8–23)
CO2: 26 mmol/L (ref 22–32)
Calcium: 9 mg/dL (ref 8.9–10.3)
Chloride: 106 mmol/L (ref 98–111)
Creatinine, Ser: 0.92 mg/dL (ref 0.61–1.24)
GFR, Estimated: >60 mL/min (ref >60)
Glucose, Bld: 100 mg/dL — ABNORMAL HIGH (ref 70–99)
Potassium: 3.7 mmol/L (ref 3.5–5.1)
Sodium: 139 mmol/L (ref 135–145)

## 2021-05-12 LAB — RESP PANEL BY RT-PCR (FLU A&B, COVID) ARPGX2
Influenza A by PCR: NEGATIVE
Influenza B by PCR: NEGATIVE
SARS Coronavirus 2 by RT PCR: NEGATIVE

## 2021-05-12 LAB — HIV ANTIBODY (ROUTINE TESTING W REFLEX): HIV Screen 4th Generation wRfx: NONREACTIVE

## 2021-05-12 LAB — APTT: aPTT: 57 seconds — ABNORMAL HIGH (ref 24–36)

## 2021-05-12 LAB — MRSA NEXT GEN BY PCR, NASAL: MRSA by PCR Next Gen: NOT DETECTED

## 2021-05-12 LAB — GLUCOSE, CAPILLARY: Glucose-Capillary: 139 mg/dL — ABNORMAL HIGH (ref 70–99)

## 2021-05-12 LAB — HEPARIN LEVEL (UNFRACTIONATED): Heparin Unfractionated: >1.1 IU/mL — ABNORMAL HIGH (ref 0.30–0.70)

## 2021-05-12 SURGERY — LOWER EXTREMITY ANGIOGRAPHY
Anesthesia: Moderate Sedation | Laterality: Right

## 2021-05-12 SURGERY — LOWER EXTREMITY INTERVENTION
Anesthesia: Moderate Sedation | Laterality: Right

## 2021-05-12 MED ORDER — ADULT MULTIVITAMIN W/MINERALS CH
1.0000 | ORAL_TABLET | Freq: Every evening | ORAL | Status: DC
  Administered 2021-05-12: 22:00:00 1 via ORAL
  Filled 2021-05-12: qty 1

## 2021-05-12 MED ORDER — FENTANYL CITRATE (PF) 100 MCG/2ML IJ SOLN
INTRAMUSCULAR | Status: DC | PRN
  Administered 2021-05-12: 50 ug via INTRAVENOUS

## 2021-05-12 MED ORDER — HEPARIN SODIUM (PORCINE) 1000 UNIT/ML IJ SOLN
INTRAMUSCULAR | Status: DC | PRN
  Administered 2021-05-12: 5000 [IU] via INTRAVENOUS

## 2021-05-12 MED ORDER — SODIUM CHLORIDE 0.9 % IV SOLN: INTRAVENOUS | Status: DC

## 2021-05-12 MED ORDER — CHLORHEXIDINE GLUCONATE CLOTH 2 % EX PADS
6.0000 | MEDICATED_PAD | Freq: Every day | CUTANEOUS | Status: DC
  Administered 2021-05-12 – 2021-05-13 (×2): 6 via TOPICAL

## 2021-05-12 MED ORDER — AMLODIPINE BESYLATE 5 MG PO TABS
5.0000 mg | ORAL_TABLET | Freq: Every day | ORAL | Status: DC
  Administered 2021-05-12: 22:00:00 5 mg via ORAL
  Filled 2021-05-12: qty 1

## 2021-05-12 MED ORDER — FENTANYL CITRATE PF 50 MCG/ML IJ SOSY
PREFILLED_SYRINGE | INTRAMUSCULAR | Status: AC
  Filled 2021-05-12: qty 1

## 2021-05-12 MED ORDER — SERTRALINE HCL 100 MG PO TABS
100.0000 mg | ORAL_TABLET | Freq: Every day | ORAL | Status: DC
  Administered 2021-05-12: 22:00:00 100 mg via ORAL
  Filled 2021-05-12 (×2): qty 1

## 2021-05-12 MED ORDER — BENAZEPRIL HCL 20 MG PO TABS
20.0000 mg | ORAL_TABLET | Freq: Every day | ORAL | Status: DC
  Administered 2021-05-12: 22:00:00 20 mg via ORAL
  Filled 2021-05-12 (×2): qty 1

## 2021-05-12 MED ORDER — MIDAZOLAM HCL 2 MG/2ML IJ SOLN
INTRAMUSCULAR | Status: AC
  Filled 2021-05-12: qty 2

## 2021-05-12 MED ORDER — ROSUVASTATIN CALCIUM 20 MG PO TABS
20.0000 mg | ORAL_TABLET | Freq: Every day | ORAL | Status: DC
  Administered 2021-05-12: 22:00:00 20 mg via ORAL
  Filled 2021-05-12 (×2): qty 1

## 2021-05-12 MED ORDER — CEFAZOLIN SODIUM-DEXTROSE 2-4 GM/100ML-% IV SOLN
2.0000 g | INTRAVENOUS | Status: AC
  Administered 2021-05-12: 11:00:00 2 g via INTRAVENOUS

## 2021-05-12 MED ORDER — MIDAZOLAM HCL 2 MG/2ML IJ SOLN
INTRAMUSCULAR | Status: DC | PRN
  Administered 2021-05-12 (×2): 2 mg via INTRAVENOUS

## 2021-05-12 MED ORDER — CEFAZOLIN SODIUM-DEXTROSE 2-4 GM/100ML-% IV SOLN
INTRAVENOUS | Status: AC
  Filled 2021-05-12: qty 200

## 2021-05-12 MED ORDER — MIDAZOLAM HCL 2 MG/2ML IJ SOLN
INTRAMUSCULAR | Status: DC | PRN
  Administered 2021-05-12: 2 mg via INTRAVENOUS

## 2021-05-12 MED ORDER — TAMSULOSIN HCL 0.4 MG PO CAPS
0.4000 mg | ORAL_CAPSULE | Freq: Every day | ORAL | Status: DC
  Administered 2021-05-12: 22:00:00 0.4 mg via ORAL
  Filled 2021-05-12: qty 1

## 2021-05-12 MED ORDER — HEPARIN BOLUS VIA INFUSION
1000.0000 [IU] | Freq: Once | INTRAVENOUS | Status: AC
  Administered 2021-05-12: 06:00:00 1000 [IU] via INTRAVENOUS
  Filled 2021-05-12: qty 1000

## 2021-05-12 MED ORDER — ALTEPLASE 2 MG IJ SOLR
INTRAMUSCULAR | Status: DC | PRN
  Administered 2021-05-12: 8 mg

## 2021-05-12 MED ORDER — ALTEPLASE 2 MG IJ SOLR
INTRAMUSCULAR | Status: AC
  Filled 2021-05-12: qty 8

## 2021-05-12 MED ORDER — HEPARIN (PORCINE) 25000 UT/250ML-% IV SOLN
INTRAVENOUS | Status: AC
  Administered 2021-05-12: 12:00:00 800 [IU]/h via INTRAVENOUS
  Filled 2021-05-12: qty 250

## 2021-05-12 MED ORDER — HEPARIN (PORCINE) 25000 UT/250ML-% IV SOLN: 800.0000 [IU]/h | INTRAVENOUS | Status: DC

## 2021-05-12 MED ORDER — APIXABAN 5 MG PO TABS: 5.0000 mg | ORAL_TABLET | Freq: Two times a day (BID) | ORAL | Status: DC

## 2021-05-12 MED ORDER — HEPARIN SODIUM (PORCINE) 1000 UNIT/ML IJ SOLN
INTRAMUSCULAR | Status: DC | PRN
  Administered 2021-05-12: 3000 [IU] via INTRAVENOUS

## 2021-05-12 MED ORDER — FENTANYL CITRATE (PF) 100 MCG/2ML IJ SOLN
INTRAMUSCULAR | Status: DC | PRN
  Administered 2021-05-12 (×2): 50 ug via INTRAVENOUS

## 2021-05-12 MED ORDER — HEPARIN SODIUM (PORCINE) 1000 UNIT/ML IJ SOLN
INTRAMUSCULAR | Status: AC
  Filled 2021-05-12: qty 10

## 2021-05-12 MED ORDER — SODIUM CHLORIDE 0.9 % IV SOLN
2.0000 mg/h | INTRAVENOUS | Status: DC
  Administered 2021-05-12: 13:00:00 2 mg/h
  Filled 2021-05-12 (×6): qty 10

## 2021-05-12 MED ORDER — ASPIRIN EC 81 MG PO TBEC: 81.0000 mg | DELAYED_RELEASE_TABLET | Freq: Every day | ORAL | Status: DC

## 2021-05-12 MED ORDER — CEFAZOLIN SODIUM-DEXTROSE 1-4 GM/50ML-% IV SOLN
INTRAVENOUS | Status: AC | PRN
  Administered 2021-05-12: 2 g via INTRAVENOUS

## 2021-05-12 MED ORDER — HEPARIN SODIUM (PORCINE) 10000 UNIT/ML IJ SOLN
INTRAMUSCULAR | Status: AC
  Filled 2021-05-12: qty 1

## 2021-05-12 SURGICAL SUPPLY — 16 items
CATH BEACON 5 .035 65 KMP TIP (CATHETERS) ×1 IMPLANT
CATH INFUS 135CMX50CM (CATHETERS) ×1 IMPLANT
CATH NAVICROSS ANGLED 135CM (MICROCATHETER) ×1 IMPLANT
CATH PIG 70CM (CATHETERS) ×1 IMPLANT
CATH VERT 5X100 (CATHETERS) ×1 IMPLANT
COVER PROBE U/S 5X48 (MISCELLANEOUS) ×1 IMPLANT
DEVICE TORQUE .025-.038 (MISCELLANEOUS) ×1 IMPLANT
GLIDEWIRE ADV .035X260CM (WIRE) ×1 IMPLANT
GUIDEWIRE PFTE-COATED .018X300 (WIRE) ×1 IMPLANT
PACK ANGIOGRAPHY (CUSTOM PROCEDURE TRAY) ×2 IMPLANT
SHEATH DESTIN RDC 6FR 45 (SHEATH) ×1 IMPLANT
SHEATH PINNACLE 5F 10CM (SHEATH) ×1 IMPLANT
SUT PROLENE 0 CT 1 30 (SUTURE) ×1 IMPLANT
SYR MEDRAD MARK 7 150ML (SYRINGE) ×1 IMPLANT
TUBING CONTRAST HIGH PRESS 72 (TUBING) ×1 IMPLANT
WIRE GUIDERIGHT .035X150 (WIRE) ×1 IMPLANT

## 2021-05-12 SURGICAL SUPPLY — 17 items
BALLN ARMADA 3.0X200X150 (BALLOONS) ×1
BALLN ARMADA 3X200X150 (BALLOONS) ×1
BALLN ULTRVRSE 3X150X150 (BALLOONS)
BALLN ULTRVRSE 6X250X130 (BALLOONS) ×2
BALLOON ARMADA 3X200X150 (BALLOONS) IMPLANT
BALLOON ULTRVRSE 3X150X150 (BALLOONS) IMPLANT
BALLOON ULTRVRSE 6X250X130 (BALLOONS) IMPLANT
CANISTER PENUMBRA ENGINE (MISCELLANEOUS) ×1 IMPLANT
CATH INDIGO CAT6 KIT (CATHETERS) ×1 IMPLANT
CATH VERT 5FR 125CM (CATHETERS) ×1 IMPLANT
DEVICE STARCLOSE SE CLOSURE (Vascular Products) ×1 IMPLANT
KIT ENCORE 26 ADVANTAGE (KITS) ×1 IMPLANT
PACK ANGIOGRAPHY (CUSTOM PROCEDURE TRAY) ×1 IMPLANT
SHEATH BALKIN 7FR (SHEATH) ×1 IMPLANT
STENT VIABAHN 7X25X120 (Permanent Stent) ×1 IMPLANT
WIRE G V18X300CM (WIRE) ×1 IMPLANT
WIRE GUIDERIGHT .035X150 (WIRE) ×1 IMPLANT

## 2021-05-12 NOTE — Progress Notes (Signed)
PROGRESS NOTE    [name]  ONG:[id number] DOB: 11/20/1955 DOA: 04/29/2021 PCP: Jaclyn Shaggy, MD   Brief Narrative: Taken from H&P. Travis Salas is a 65 y.o. male with medical history significant for hypertension, dyslipidemia, peripheral vascular disease status post femoral-popliteal bypass on Eliquis and collagen vascular disease, who presented to the ER with acute onset of right leg and foot pain that started last night.  Admitted for acute on chronic right lower extremity ischemia.  Vascular surgery was consulted and he was taken to the OR, found to have occluded graft, tPA catheter was placed and he was transferred to ICU for tPA.  Subjective: Patient was seen and examined today.  Stating that pain is controlled with pain medications.  He was very concerned about losing his foot.  Waiting for his angiography.  Assessment & Plan:   Principal Problem:   Pain of right lower extremity due to ischemia Active Problems:   Lower limb ischemia  Acute on chronic right lower extremity ischemia.  History of severe peripheral arterial disease s/p femoral-popliteal bypass, which was found to be occluded during angiography. Please see the full report under procedure or Dr. Driscilla Grammes note. tPA catheter was placed and he will be admitted to ICU to complete his tPA. -Continue with pain management -TPN management per vascular surgery  Essential hypertension.  Blood pressure within goal. -Continue home dose of amlodipine and benazepril  Dyslipidemia. -Continue home dose of statin  Depression. -Continue home dose of Zoloft  BPH. -Continue home dose of Flomax  Peripheral neuropathy. -Continue home dose of Neurontin  Objective: Vitals:   04/22/2021 1300 04/22/2021 1315 05/08/2021 1330 05/07/2021 1345  BP: 125/75 124/70 (!) 146/84 137/72  Pulse: 72 72 68 75  Resp: 12 17 13 20   Temp:      TempSrc:      SpO2: 95% 94% 95% 94%  Weight:      Height:        Intake/Output Summary (Last  24 hours) at 04/30/2021 1357 Last data filed at 05/14/2021 0640 Gross per 24 hour  Intake --  Output 125 ml  Net -125 ml   Filed Weights   05/08/2021 1717 04/18/2021 1020  Weight: 70.3 kg 70.3 kg    Examination:  General exam: Appears calm and comfortable  Respiratory system: Clear to auscultation. Respiratory effort normal. Cardiovascular system: S1 & S2 heard, RRR.  Gastrointestinal system: Soft, nontender, nondistended, bowel sounds positive. Central nervous system: Alert and oriented. No focal neurological deficits. Extremities: No edema, no palpable pulses on right, foot was warm with slight discoloration. Psychiatry: Judgement and insight appear normal.     DVT prophylaxis: Currently on tPA Code Status: Full Family Communication:  Disposition Plan:  Status is: Inpatient  Remains inpatient appropriate because: Severity of illness   Level of care: ICU  All the records are reviewed and case discussed with Care Management/Social Worker. Management plans discussed with the patient, nursing and they are in agreement.  Consultants:  Vascular surgery  Procedures:  Antimicrobials:   Data Reviewed: I have personally reviewed following labs and imaging studies  CBC: Recent Labs  Lab 05/10/2021 1723 05/07/2021 0500  WBC 11.9* 8.5  NEUTROABS 9.5*  --   HGB 15.7 13.6  HCT 46.2 39.8  MCV 90.4 90.2  PLT 186 159   Basic Metabolic Panel: Recent Labs  Lab 04/17/2021 1723 04/21/2021 0500  NA 137 139  K 4.0 3.7  CL 102 106  CO2 23 26  GLUCOSE 107* 100*  BUN 14 15  CREATININE 0.88 0.92  CALCIUM 9.8 9.0   GFR: Estimated Creatinine Clearance: 77.4 mL/min (by C-G formula based on SCr of 0.92 mg/dL). Liver Function Tests: Recent Labs  Lab 04/26/2021 1723  AST 37  ALT 30  ALKPHOS 85  BILITOT 0.8  PROT 7.7  ALBUMIN 4.8   No results for input(s): LIPASE, AMYLASE in the last 168 hours. No results for input(s): AMMONIA in the last 168 hours. Coagulation Profile: Recent  Labs  Lab 05/10/2021 1723  INR 1.3*   Cardiac Enzymes: No results for input(s): CKTOTAL, CKMB, CKMBINDEX, TROPONINI in the last 168 hours. BNP (last 3 results) No results for input(s): PROBNP in the last 8760 hours. HbA1C: No results for input(s): HGBA1C in the last 72 hours. CBG: No results for input(s): GLUCAP in the last 168 hours. Lipid Profile: No results for input(s): CHOL, HDL, LDLCALC, TRIG, CHOLHDL, LDLDIRECT in the last 72 hours. Thyroid Function Tests: No results for input(s): TSH, T4TOTAL, FREET4, T3FREE, THYROIDAB in the last 72 hours. Anemia Panel: No results for input(s): VITAMINB12, FOLATE, FERRITIN, TIBC, IRON, RETICCTPCT in the last 72 hours. Sepsis Labs: No results for input(s): PROCALCITON, LATICACIDVEN in the last 168 hours.  No results found for this or any previous visit (from the past 240 hour(s)).   Radiology Studies: PERIPHERAL VASCULAR CATHETERIZATION  Result Date: 05/08/2021 See surgical note for result.   Scheduled Meds:  fentaNYL       fentaNYL       heparin       heparin sodium (porcine)       midazolam       midazolam       Continuous Infusions:  sodium chloride 75 mL/hr at 05/02/2021 6967   alteplase (LIMB ISCHEMIA) 10 mg in normal saline (0.02 mg/mL) infusion 2 mg/hr (04/22/2021 1239)   heparin 800 Units/hr (04/30/2021 1213)     LOS: 1 day   Time spent: 40 minutes. More than 50% of the time was spent in counseling/coordination of care  Arnetha Courser, MD [facility]  If 7PM-7AM, please contact night-coverage [email or web address]  05/08/2021, 1:57 PM   This record has been created using Conservation officer, historic buildings. Errors have been sought and corrected,but may not always be located. Such creation errors do not reflect on the standard of care.

## 2021-05-12 NOTE — ED Notes (Addendum)
Awake and alert. Orientated x 4. No complaints of pain at this time. Heparin and NS infusing. Unable to doppler dorsalis pedis and posterior tibial pulses of R leg. VS reassessed. R foot cool to touch. Dusky color, cap refill > 3 sec.

## 2021-05-12 NOTE — Progress Notes (Signed)
ANTICOAGULATION CONSULT NOTE - Initial Consult  Pharmacy Consult for IV heparin Indication:  limb ischemia  Allergies  Allergen Reactions   Hydrocodone Swelling    Patient Measurements: Height: 5\' 8"  (172.7 cm) Weight: 70.3 kg (155 lb) IBW/kg (Calculated) : 68.4  Vital Signs: Temp: 98 F (36.7 C) (12/29 0250) Temp Source: Oral (12/29 0250) BP: 156/125 (12/29 0250) Pulse Rate: 72 (12/29 0250)  Labs: Recent Labs    04/26/2021 1723 05/05/2021 2117 05/03/2021 0500  HGB 15.7  --  13.6  HCT 46.2  --  39.8  PLT 186  --  159  APTT  --  34 57*  LABPROT 16.2*  --   --   INR 1.3*  --   --   HEPARINUNFRC  --  >1.10* >1.10*  CREATININE 0.88  --  0.92     Estimated Creatinine Clearance: 77.4 mL/min (by C-G formula based on SCr of 0.92 mg/dL).   Medical History: Past Medical History:  Diagnosis Date   Collagen vascular disease (HCC)    Hyperlipidemia    Hypertension    Peripheral vascular disease (HCC)      Assessment: 65YOM cc pain and decreased circulation to right foot. PMH PVD s/p bypass  On anticoagulation at home with apixaban 5 mg BID. Last dose of Eliquis today (12/28) @1300 . Last dose within 12 hours.  Goal of Therapy:  aPTT 66-102 seconds Monitor platelets by anticoagulation protocol: Yes   12/29 0500 aPTT 57 subtherapeutic, HL >1.1  Plan:  Give heparin bolus 1000 units Increase heparin infusion to 1250 units/hr Recheck aPTT in 6 hours. Titrating via aPTT until HL correlates. Checking HL at least daily. Daily CBC.  , PharmD, [facility] 04/25/2021 5:58 AM

## 2021-05-12 NOTE — Op Note (Signed)
[facility]  Percutaneous Study/Intervention Procedural Note   Date of Surgery: 05/07/2021  Surgeon(s):Venba Zenner    Assistants:none  Pre-operative Diagnosis: PAD with rest pain right lower extremity, acute on chronic ischemia  Post-operative diagnosis:  Same  Procedure(s) Performed:             1.  Ultrasound guidance for vascular access left femoral artery             2.  Catheter placement into right posterior tibial artery from left femoral approach             3.  Aortogram and selective right lower extremity angiogram             4.  Placement of a thrombolytic catheter from the origin of the femoral to tibioperoneal trunk bypass and in the proximal posterior tibial artery using a 135 cm total length, 50 cm working length catheter and instilling 8 mg of tPA             EBL: 5 cc  Contrast: 30 cc  Fluoro Time: 5 minutes  Moderate Conscious Sedation Time: approximately 37 minutes using 4 mg of Versed and 100 mcg of Fentanyl              Indications:  Patient is a 65 y.o.male with acute on chronic ischemia of the right lower extremity for about 36 hours.  He has a long history of PAD with previous bypass surgery and interventions on the right leg. The patient is brought in for angiography for further evaluation and potential treatment.  Due to the limb threatening nature of the situation, angiogram was performed for attempted limb salvage. The patient is aware that if the procedure fails, amputation would be expected.  The patient also understands that even with successful revascularization, amputation may still be required due to the severity of the situation.  Risks and benefits are discussed and informed consent is obtained.   Procedure:  The patient was identified and appropriate procedural time out was performed.  The patient was then placed supine on the table and prepped and draped in the usual sterile fashion. Moderate conscious sedation was  administered during a face to face encounter with the patient throughout the procedure with my supervision of the RN administering medicines and monitoring the patient's vital signs, pulse oximetry, telemetry and mental status throughout from the start of the procedure until the patient was taken to the recovery room. Ultrasound was used to evaluate the left common femoral artery.  It was patent but diseased.  A digital ultrasound image was acquired.  A Seldinger needle was used to access the left common femoral artery under direct ultrasound guidance and a permanent image was performed.  A 0.035 J wire was advanced without resistance and a 5Fr sheath was placed.  Pigtail catheter was placed into the aorta and an AP aortogram was performed. This demonstrated that the renal arteries appeared normal.  The aorta was quite irregular but not significantly stenotic.  The previously placed right iliac stents were patent.  The left common iliac artery is widely patent.  The left external iliac artery appeared somewhat narrowed distally. I then crossed the aortic bifurcation and advanced to the right femoral head. Selective right lower extremity angiogram was then performed. This demonstrated the common femoral artery and profunda femoris artery to be reasonably normal although the profunda femoris artery was a little small.  The SFA as well as the bypass were both occluded proximally  with only a short stump seen.  Distal reconstitution was extremely poor and on initial imaging, minimal flow seen distally. It was felt that it was in the patient's best interest to proceed with intervention after these images to avoid a second procedure and a larger amount of contrast and fluoroscopy based off of the findings from the initial angiogram. The patient was systemically heparinized and a 6 [place] destination sheath was then placed over the Terumo Advantage wire. I then used a Kumpe catheter and the advantage wire to get into the  bypass and easily cross the bypass and get down into the tibioperoneal trunk where it terminated.  There was a second bypass from the tibioperoneal trunk to the anterior tibial artery that was also occluded.  I was then able to get down into the posterior tibial artery and confirmed that this was patent beyond thrombus in the proximal portion of the posterior tibial artery.  With the extensive amount of thrombus, I thought the only chance of salvaging this bypass graft would be a continuous infusion of thrombolytic therapy.  Over the advantage wire, I then placed a 135 cm total length 50 cm working length catheter from the origin of the bypass graft analogous to the proximal SFA down across the distal bypass anastomosis and into the posterior tibial artery.  8 mg of tPA were then instilled through the thrombolytic catheter and the catheter and the sheath were secured to the leg with a Prolene suture and continuous thrombolytic therapy will be run until he is brought back for second look angiography. I elected to terminate the procedure. The patient was taken to the recovery room in stable condition having tolerated the procedure well.  Findings:               Aortogram: Renal arteries appeared normal.  The aorta was quite irregular but not significantly stenotic.  The previously placed right iliac stents were patent.  The left common iliac artery is widely patent.  The left external iliac artery appeared somewhat narrowed distally.             Right lower Extremity:  This demonstrated the common femoral artery and profunda femoris artery to be reasonably normal although the profunda femoris artery was a little small.  The SFA as well as the bypass were both occluded proximally with only a short stump seen.  Distal reconstitution was extremely poor and on initial imaging, minimal flow seen distally.   Disposition: Patient was taken to the recovery room in stable condition having tolerated the procedure  well.  Complications: None  Travis Salas 05/11/2021 12:03 PM   This note was created with Dragon Medical transcription system. Any errors in dictation are purely unintentional.

## 2021-05-12 NOTE — Consult Note (Signed)
[facility] Vascular Consult Note  MRN : [id number]  Travis Salas is a 65 y.o. (1956/01/12) male who presents with chief complaint of  Chief Complaint  Patient presents with   Leg Pain  .  History of Present Illness: I am asked to see the patient by Dr. Roxan Hockey for right lower extremity ischemia.  The patient is well-known to our practice and has had multiple previous interventions and surgery to the right leg.  His last intervention was about a year and a half ago.  He reports approximately 24-hour history of pain and pallor to the right foot and lower leg.  He was not having any major issues before this.  No trauma, injury, or inciting event that started the symptoms.  Has chronic claudication symptoms of the left leg which are unchanged.  Current Facility-Administered Medications  Medication Dose Route Frequency Provider Last Rate Last Admin   0.9 %  sodium chloride infusion   Intravenous Continuous Annice Needy, MD 75 mL/hr at 05/14/2021 3244 New Bag at 04/25/2021 0632   [MAR Hold] acetaminophen (TYLENOL) tablet 650 mg  650 mg Oral Q6H PRN Annice Needy, MD       Or   Mitzi Hansen Hold] acetaminophen (TYLENOL) suppository 650 mg  650 mg Rectal Q6H PRN Annice Needy, MD       alteplase (LIMB ISCHEMIA) 10 mg in normal saline (0.02 mg/mL) infusion  2 mg/hr Intracatheter Continuous Annice Needy, MD 100 mL/hr at 05/01/2021 1239 2 mg/hr at 04/14/2021 1239   fentaNYL (SUBLIMAZE) 50 MCG/ML injection            fentaNYL (SUBLIMAZE) 50 MCG/ML injection            heparin 01027 UNIT/ML injection            heparin ADULT infusion 100 units/mL (25000 units/230mL)  800 Units/hr Intravenous Continuous Annice Needy, MD 8 mL/hr at 04/29/2021 1213 800 Units/hr at 04/18/2021 1213   heparin sodium (porcine) 1000 UNIT/ML injection            [MAR Hold] magnesium hydroxide (MILK OF MAGNESIA) suspension 30 mL  30 mL Oral Daily PRN Annice Needy, MD       midazolam (VERSED) 2 MG/2ML injection             midazolam (VERSED) 2 MG/2ML injection            [MAR Hold] morphine 2 MG/ML injection 2 mg  2 mg Intravenous Q4H PRN Annice Needy, MD       [MAR Hold] ondansetron (ZOFRAN) tablet 4 mg  4 mg Oral Q6H PRN Annice Needy, MD       Or   Mitzi Hansen Hold] ondansetron (ZOFRAN) injection 4 mg  4 mg Intravenous Q6H PRN Annice Needy, MD       [MAR Hold] traZODone (DESYREL) tablet 25 mg  25 mg Oral QHS PRN Annice Needy, MD        Past Medical History:  Diagnosis Date   Collagen vascular disease (HCC)    Hyperlipidemia    Hypertension    Peripheral vascular disease (HCC)     Past Surgical History:  Procedure Laterality Date   BYPASS GRAFT ANGIOGRAPHY Right 11/21/2019   Procedure: BYPASS GRAFT ANGIOGRAPHY;  Surgeon: Griffith Citron, MD;  Location: [facility];  Service: Cardiovascular;  Laterality: Right;   CENTRAL LINE INSERTION Left 01/07/2020   Procedure: CENTRAL LINE INSERTION;  Surgeon: Annice Needy,  MD;  Location: [facility];  Service: Vascular;  Laterality: Left;   ENDARTERECTOMY FEMORAL Right 12/23/2015   Procedure: COMMON, FEMORAL, SUPERFICIAL, AND PROFUNDA ENDARTERECTOMY;  Surgeon: Annice Needy, MD;  Location: [facility];  Service: Vascular;  Laterality: Right;   FEMORAL-TIBIAL BYPASS GRAFT Right 12/23/2015   Procedure: BYPASS GRAFT FEMORAL-TIBIAL ARTERY, COMMON FEMORAL AND PROFUNDIS FEMORAL WITH PATCH ANGIOPLASTY, POPLITEAL ANTERIOR ARTERY BYPASS, ILIOFEMORAL ANGIOGRAM VIABHAM STENT 8 MM DIAMETER X 5 CM LENGTH;  Surgeon: Annice Needy, MD;  Location: [facility];  Service: Vascular;  Laterality: Right;   INSERTION OF ILIAC STENT Right 01/07/2020   Procedure: INSERTION OF ILIAC STENT;  Surgeon: Annice Needy, MD;  Location: [facility];  Service: Vascular;  Laterality: Right;   LOWER EXTREMITY ANGIOGRAM Right 01/07/2020   Procedure: LOWER EXTREMITY ANGIOGRAM  insertion of infusion catheter ;  Surgeon: Annice Needy, MD;  Location: [facility];  Service: Vascular;  Laterality: Right;   LOWER EXTREMITY ANGIOGRAPHY  Right 06/19/2019   Procedure: LOWER EXTREMITY ANGIOGRAPHY;  Surgeon: Annice Needy, MD;  Location: [facility];  Service: Cardiovascular;  Laterality: Right;   LOWER EXTREMITY ANGIOGRAPHY Right 06/20/2019   Procedure: Lower Extremity Angiography;  Surgeon: Annice Needy, MD;  Location: [facility];  Service: Cardiovascular;  Laterality: Right;   LOWER EXTREMITY ANGIOGRAPHY Right 01/08/2020   Procedure: Lower Extremity Angiography;  Surgeon: Annice Needy, MD;  Location: [facility];  Service: Cardiovascular;  Laterality: Right;   PERIPHERAL VASCULAR CATHETERIZATION Right 11/08/2015   Procedure: Lower Extremity Angiography;  Surgeon: Annice Needy, MD;  Location: [facility];  Service: Cardiovascular;  Laterality: Right;   PERIPHERAL VASCULAR CATHETERIZATION Right 12/13/2015   Procedure: Lower Extremity Angiography;  Surgeon: Annice Needy, MD;  Location: [facility];  Service: Cardiovascular;  Laterality: Right;   PERIPHERAL VASCULAR THROMBECTOMY Right 11/21/2019   Procedure: PERIPHERAL VASCULAR THROMBECTOMY;  Surgeon: Griffith Citron, MD;  Location: [facility];  Service: Cardiovascular;  Laterality: Right;   PERIPHERAL VASCULAR THROMBECTOMY Right 11/22/2019   Procedure: PERIPHERAL VASCULAR THROMBECTOMY;  Surgeon: Griffith Citron, MD;  Location: [facility];  Service: Cardiovascular;  Laterality: Right;     Social History   Tobacco Use   Smoking status: Former    Packs/day: 1.50    Years: 46.00    Pack years: 69.00    Types: Cigarettes    Quit date: 02/05/2015    Years since quitting: 6.2   Smokeless tobacco: Never  Vaping Use   Vaping Use: Never used  Substance Use Topics   Alcohol use: Yes    Alcohol/week: 7.0 standard drinks    Types: 7 Cans of beer per week   Drug use: Yes    Types: Marijuana    Comment: occasional     Family History  Problem Relation Age of Onset   Diabetes Mother    Hypertension Brother   No bleeding or  clotting disorders Brother has PAD as well  Allergies  Allergen Reactions   Hydrocodone Swelling     REVIEW OF SYSTEMS (Negative unless checked)  Constitutional: [] Weight loss  [] Fever  [] Chills Cardiac: [] Chest pain   [] Chest pressure   [] Palpitations   [] Shortness of breath when laying flat   [] Shortness of breath at rest   [] Shortness of breath with exertion. Vascular:  [] Pain in legs with walking   [] Pain in legs at rest   [x] Pain in legs when laying flat   [x] Claudication   []   Pain in feet when walking  [x] Pain in feet at rest  [] Pain in feet when laying flat   [] History of DVT   [] Phlebitis   [] Swelling in legs   [] Varicose veins   [] Non-healing ulcers Pulmonary:   [] Uses home oxygen   [] Productive cough   [] Hemoptysis   [] Wheeze  [] COPD   [] Asthma Neurologic:  [] Dizziness  [] Blackouts   [] Seizures   [] History of stroke   [] History of TIA  [] Aphasia   [] Temporary blindness   [] Dysphagia   [] Weakness or numbness in arms   [] Weakness or numbness in legs Musculoskeletal:  [] Arthritis   [] Joint swelling   [] Joint pain   [] Low back pain Hematologic:  [] Easy bruising  [] Easy bleeding   [] Hypercoagulable state   [] Anemic  [] Hepatitis Gastrointestinal:  [] Blood in stool   [] Vomiting blood  [] Gastroesophageal reflux/heartburn   [] Difficulty swallowing. Genitourinary:  [] Chronic kidney disease   [] Difficult urination  [] Frequent urination  [] Burning with urination   [] Blood in urine Skin:  [] Rashes   [] Ulcers   [] Wounds Psychological:  [] History of anxiety   []  History of major depression.  Physical Examination  Vitals:   04/23/2021 1108 04/27/2021 1215 04/19/2021 1230 04/29/2021 1245  BP:  (!) 148/64 130/64 132/72  Pulse:  71 71 74  Resp:  15 13 14   Temp:      TempSrc:      SpO2: 98% 92% 93% 96%  Weight:      Height:       Body mass index is 23.57 kg/m. Gen:  WD/WN, NAD Head: [place]/AT, No temporalis wasting.  Ear/Nose/Throat: Hearing grossly intact, nares w/o erythema or drainage, oropharynx  w/o Erythema/Exudate Eyes: Sclera non-icteric, conjunctiva clear Neck: Trachea midline.  No JVD.  Pulmonary:  Good air movement, respirations not labored, equal bilaterally.  Cardiac: RRR, normal S1, S2. Vascular:  Vessel Right Left  Radial Palpable Palpable                          PT Not palpable Not palpable  DP Not palpable Not palpable   Gastrointestinal: soft, non-tender/non-distended. No guarding/reflex.  Musculoskeletal: M/S 5/5 throughout.  Right foot with markedly diminished capillary refill and is cool.  Left foot has sluggish capillary refill as well but is relatively warm. Neurologic: Sensation grossly intact in extremities.  Symmetrical.  Speech is fluent. Motor exam as listed above. Psychiatric: Judgment intact, Mood & affect appropriate for pt's clinical situation. Dermatologic: No rashes or ulcers noted.  No cellulitis or open wounds.      CBC Lab Results  Component Value Date   WBC 8.5 04/29/2021   HGB 13.6 05/05/2021   HCT 39.8 04/20/2021   MCV 90.2 04/24/2021   PLT 159 04/26/2021    BMET    Component Value Date/Time   NA 139 05/14/2021 0500   K 3.7 05/06/2021 0500   CL 106 04/19/2021 0500   CO2 26 05/14/2021 0500   GLUCOSE 100 (H) 04/24/2021 0500   BUN 15 05/05/2021 0500   CREATININE 0.92 05/10/2021 0500   CALCIUM 9.0 04/20/2021 0500   GFRNONAA >60 04/17/2021 0500   GFRAA >60 01/09/2020 0508   Estimated Creatinine Clearance: 77.4 mL/min (by C-G formula based on SCr of 0.92 mg/dL).  COAG Lab Results  Component Value Date   INR 1.3 (H) 04/20/2021   INR 1.2 01/07/2020   INR 1.3 (H) 11/22/2019    Radiology PERIPHERAL VASCULAR CATHETERIZATION  Result Date: 05/07/2021 See surgical note for result.  Assessment/Plan 1.  Acute ischemia of the right lower extremity.  Patient with extensive vascular history and his bypass clinically appears to be occluded.  On anticoagulation.  Will need angiography today with attempt at limb salvage.   This is a critical and limb threatening situation 2.  PAD.  Has chronic issues with claudication on the left leg is required interventions and surgery on the right leg. 3.  Hypertension.  Stable on outpatient medication and blood pressure control important in reducing the progression of atherosclerotic disease. On appropriate oral medications. 4.  Hyperlipidemia. lipid control important in reducing the progression of atherosclerotic disease. Continue statin therapy    Festus Barren, MD  04/25/2021 1:05 PM    This note was created with Dragon medical transcription system.  Any error is purely unintentional

## 2021-05-12 NOTE — Op Note (Signed)
[place] [facility]  Percutaneous Study/Intervention Procedural Note   Date of Surgery: 05/01/2021  Surgeon(s):Zelda Reames    Assistants:none  Pre-operative Diagnosis: PAD with rest right lower extremity, acute on chronic ischemia, status post thrombolytic therapy throughout the day  Post-operative diagnosis:  Same  Procedure(s) Performed:             1.  Right lower extremity angiogram             2.  Mechanical thrombectomy using the penumbra CAT 6 device to the right femoral to distal bypass and the right tibioperoneal trunk and posterior tibial arteries             3.  Percutaneous transluminal angioplasty of the right posterior tibial artery and the distal bypass graft anastomosis with 3 mm diameter by 20 cm length angioplasty balloon             4.  Percutaneous transluminal angioplasty of jump graft from the main bypass to the anterior tibial artery in the proximal to mid anterior tibial artery with 3 mm diameter by 20 cm length angioplasty balloon             5.  Viabahn stent placement to the proximal portion of the right femoral to distal bypass with a 7 mm diameter by 25 cm length stent  6.  StarClose closure device left femoral artery  EBL: 400 cc  Contrast: 65 cc  Fluoro Time: 7.6 minutes  Moderate Conscious Sedation Time: approximately 65 minutes using 2 mg of Versed and 50 mcg of Fentanyl              Indications:  Patient is a 65 y.o.male with rest Salas. The patient has been running tpa throughout the afternoon and is brought back for second look angiogram. The patient is brought in for angiography for further evaluation and potential treatment.  Due to the limb threatening nature of the situation, angiogram was performed for attempted limb salvage. The patient is aware that if the procedure fails, amputation would be expected.  The patient also understands that even with successful revascularization, amputation may still be required due to the severity of  the situation.  Risks and benefits are discussed and informed consent is obtained.   Procedure:  The patient was identified and appropriate procedural time out was performed.  The patient was then placed supine on the table and prepped and draped in the usual sterile fashion. Moderate conscious sedation was administered during a face to face encounter with the patient throughout the procedure with my supervision of the RN administering medicines and monitoring the patient's vital signs, pulse oximetry, telemetry and mental status throughout from the start of the procedure until the patient was taken to the recovery room.  The V 18 wire was placed through the existing thrombolytic catheter and parked in the distal posterior tibial artery and the thrombolytic catheter was removed.  Selective right lower extremity angiogram was then performed through the existing sheath. This demonstrated Continued thrombosis of the right femoral to distal bypass although there was now some flow through the bypass with incomplete thrombosis.  The jump graft from the initial bypass to the anterior tibial artery was now also seen although there was significant stenosis at the distal anastomosis of this to the anterior tibial artery.  There is also significant stenosis of the distal bypass anastomosis to the tibioperoneal trunk and in the proximal portion of the posterior tibial artery.  This in conjunction with the  continued thrombus within the graft required intervention. It was felt that it was in the patient's best interest to proceed with intervention after these images to attempt clearance of the bypass and limb salvage rather than continued thrombolytic therapy.  The penumbra CAT 6 device was then brought onto the field.  Multiple passes were made throughout the length of the femoral to distal bypass and down into the posterior tibial artery across the distal bypass anastomosis.  Multiple large chunks of thrombus were removed.   The areas of narrowing at the distal bypass anastomosis in the tibial vessels remained.  There were 2 large areas of thrombus that were refractory to thrombectomy.  1 was at the initial portion of the bypass graft just above the previously placed stents in the second was in the previously placed stents at the distal edge.  These are separated by about 15 to 20 cm.  I was able to cover both of these areas with a 7 mm diameter by 25 cm length Viabahn stent after upsizing to a 7 [place] sheath.  This was postdilated with a 6 mm diameter angioplasty balloon with excellent angiographic completion result and resolution of the thrombus and less than 10% residual stenosis.  I then turned my attention to the distal runoff.  The posterior tibial artery and the proximal portion as well as the distal bypass anastomosis were then addressed with a 3 mm diameter by 20 cm length angioplasty balloon inflated to 8 atm for 1 minute.  Completion imaging showed marked improvement with less than 20% residual stenosis.  I then used a Kumpe catheter and the V 18 wire to cannulate the jump graft from the initial bypass down to the anterior tibial artery and cross this stenosis in the anterior tibial artery and at the distal portion of the bypass anastomosis that was greater than 80%.  The wire was parked at the ankle.  A 3 mm diameter by 20 cm length angioplasty balloon was then inflated to 10 atm in the anterior tibial artery and across the bypass from the jump graft to the anterior tibial artery.  Completion imaging showed marked improvement with less than 20% residual stenosis.  At this point, he now two-vessel runoff and no significant residual thrombus.  His flow had been restored as best possible.  I elected to terminate the procedure. The sheath was removed and StarClose closure device was deployed in the left femoral artery with excellent hemostatic result. The patient was taken to the recovery room in stable condition having  tolerated the procedure well.  Findings:                            Right Lower Extremity: Continued thrombosis of the right femoral to distal bypass although there was now some flow through the bypass with incomplete thrombosis.  The jump graft from the initial bypass to the anterior tibial artery was now also seen although there was significant stenosis at the distal anastomosis of this to the anterior tibial artery.  There is also significant stenosis of the distal bypass anastomosis to the tibioperoneal trunk and in the proximal portion of the posterior tibial artery.  This in conjunction with the continued thrombus within the graft required intervention.   Disposition: Patient was taken to the recovery room in stable condition having tolerated the procedure well.  Complications: None  Travis Salas 04/22/2021 5:18 PM   This note was created with Dragon Medical transcription system.  Any errors in dictation are purely unintentional.

## 2021-05-13 ENCOUNTER — Encounter: Admission: EM | Disposition: E | Payer: Self-pay | Source: Home / Self Care | Attending: Internal Medicine

## 2021-05-13 ENCOUNTER — Encounter: Payer: Self-pay | Admitting: Vascular Surgery

## 2021-05-13 ENCOUNTER — Encounter: Payer: Self-pay | Admitting: Anesthesiology

## 2021-05-13 DIAGNOSIS — I739 Peripheral vascular disease, unspecified: Secondary | ICD-10-CM

## 2021-05-13 DIAGNOSIS — I998 Other disorder of circulatory system: Secondary | ICD-10-CM

## 2021-05-13 DIAGNOSIS — M79604 Pain in right leg: Secondary | ICD-10-CM

## 2021-05-13 DIAGNOSIS — I10 Essential (primary) hypertension: Secondary | ICD-10-CM

## 2021-05-13 DIAGNOSIS — I70221 Atherosclerosis of native arteries of extremities with rest pain, right leg: Secondary | ICD-10-CM

## 2021-05-13 HISTORY — PX: LOWER EXTREMITY INTERVENTION: CATH118252

## 2021-05-13 LAB — BASIC METABOLIC PANEL
Anion gap: 9 (ref 5–15)
BUN: 12 mg/dL (ref 8–23)
CO2: 24 mmol/L (ref 22–32)
Calcium: 8.7 mg/dL — ABNORMAL LOW (ref 8.9–10.3)
Chloride: 104 mmol/L (ref 98–111)
Creatinine, Ser: 0.87 mg/dL (ref 0.61–1.24)
GFR, Estimated: >60 mL/min (ref >60)
Glucose, Bld: 107 mg/dL — ABNORMAL HIGH (ref 70–99)
Potassium: 3.4 mmol/L — ABNORMAL LOW (ref 3.5–5.1)
Sodium: 137 mmol/L (ref 135–145)

## 2021-05-13 LAB — CBC
HCT: 34.8 % — ABNORMAL LOW (ref 39.0–52.0)
Hemoglobin: 11.8 g/dL — ABNORMAL LOW (ref 13.0–17.0)
MCH: 30.4 pg (ref 26.0–34.0)
MCHC: 33.9 g/dL (ref 30.0–36.0)
MCV: 89.7 fL (ref 80.0–100.0)
Platelets: 136 10*3/uL — ABNORMAL LOW (ref 150–400)
RBC: 3.88 MIL/uL — ABNORMAL LOW (ref 4.22–5.81)
RDW: 12.7 % (ref 11.5–15.5)
WBC: 8.5 10*3/uL (ref 4.0–10.5)
nRBC: 0 % (ref 0.0–0.2)

## 2021-05-13 SURGERY — LOWER EXTREMITY ANGIOGRAPHY
Anesthesia: General | Laterality: Right

## 2021-05-13 SURGERY — LOWER EXTREMITY INTERVENTION
Anesthesia: Moderate Sedation | Laterality: Right

## 2021-05-13 MED ORDER — ONDANSETRON HCL 4 MG/2ML IJ SOLN
INTRAMUSCULAR | Status: DC | PRN
  Administered 2021-05-13: 4 mg via INTRAVENOUS

## 2021-05-13 MED ORDER — CEFAZOLIN SODIUM-DEXTROSE 2-4 GM/100ML-% IV SOLN: 2.0000 g | INTRAVENOUS | Status: DC

## 2021-05-13 MED ORDER — ONDANSETRON HCL 4 MG/2ML IJ SOLN
INTRAMUSCULAR | Status: AC
  Filled 2021-05-13: qty 2

## 2021-05-13 MED ORDER — MIDAZOLAM HCL 2 MG/2ML IJ SOLN
INTRAMUSCULAR | Status: AC
  Filled 2021-05-13: qty 2

## 2021-05-13 MED ORDER — FENTANYL CITRATE (PF) 100 MCG/2ML IJ SOLN
INTRAMUSCULAR | Status: DC | PRN
  Administered 2021-05-13: 50 ug via INTRAVENOUS
  Administered 2021-05-13 (×2): 25 ug via INTRAVENOUS

## 2021-05-13 MED ORDER — TIROFIBAN (AGGRASTAT) BOLUS VIA INFUSION
25.0000 ug/kg | Freq: Once | INTRAVENOUS | Status: AC
  Administered 2021-05-13: 07:00:00 1707.5 ug via INTRAVENOUS
  Filled 2021-05-13: qty 35

## 2021-05-13 MED ORDER — ALTEPLASE 1 MG/ML SYRINGE FOR VASCULAR PROCEDURE
INTRAMUSCULAR | Status: DC | PRN
  Administered 2021-05-13: 6 mg via INTRA_ARTERIAL

## 2021-05-13 MED ORDER — SODIUM CHLORIDE 0.9 % IV SOLN: INTRAVENOUS | Status: DC

## 2021-05-13 MED ORDER — ETOMIDATE 2 MG/ML IV SOLN
INTRAVENOUS | Status: AC
  Filled 2021-05-13: qty 20

## 2021-05-13 MED ORDER — HEPARIN (PORCINE) 25000 UT/250ML-% IV SOLN
INTRAVENOUS | Status: AC
  Filled 2021-05-13: qty 250

## 2021-05-13 MED ORDER — MIDAZOLAM HCL 2 MG/2ML IJ SOLN
INTRAMUSCULAR | Status: DC | PRN
  Administered 2021-05-13: 2 mg via INTRAVENOUS
  Administered 2021-05-13 (×2): 1 mg via INTRAVENOUS

## 2021-05-13 MED ORDER — TIROFIBAN HCL IN NACL 5-0.9 MG/100ML-% IV SOLN
0.1500 ug/kg/min | INTRAVENOUS | Status: DC
  Administered 2021-05-13: 07:00:00 0.15 ug/kg/min via INTRAVENOUS
  Filled 2021-05-13 (×3): qty 100

## 2021-05-13 MED ORDER — IODIXANOL 320 MG/ML IV SOLN
INTRAVENOUS | Status: DC | PRN
  Administered 2021-05-13: 10:00:00 30 mL via INTRA_ARTERIAL

## 2021-05-13 MED ORDER — FENTANYL CITRATE (PF) 100 MCG/2ML IJ SOLN
INTRAMUSCULAR | Status: AC
  Filled 2021-05-13: qty 2

## 2021-05-13 MED ORDER — HEPARIN SODIUM (PORCINE) 1000 UNIT/ML IJ SOLN
INTRAMUSCULAR | Status: AC
  Filled 2021-05-13: qty 10

## 2021-05-13 MED ORDER — ALTEPLASE 2 MG IJ SOLR
INTRAMUSCULAR | Status: AC
  Filled 2021-05-13: qty 6

## 2021-05-13 MED ORDER — HEPARIN SODIUM (PORCINE) 1000 UNIT/ML IJ SOLN
INTRAMUSCULAR | Status: DC | PRN
  Administered 2021-05-13: 4000 [IU] via INTRAVENOUS

## 2021-05-13 MED ORDER — SUCCINYLCHOLINE CHLORIDE 200 MG/10ML IV SOSY
PREFILLED_SYRINGE | INTRAVENOUS | Status: AC
  Filled 2021-05-13: qty 10

## 2021-05-13 MED ORDER — CEFAZOLIN SODIUM-DEXTROSE 2-4 GM/100ML-% IV SOLN
INTRAVENOUS | Status: AC
  Filled 2021-05-13: qty 100

## 2021-05-13 SURGICAL SUPPLY — 14 items
BIOPATCH RED 1 DISK 7.0 (GAUZE/BANDAGES/DRESSINGS) ×1 IMPLANT
CANISTER PENUMBRA ENGINE (MISCELLANEOUS) ×1 IMPLANT
CATH INFUS 135CMX50CM (CATHETERS) ×1 IMPLANT
CATH LIGHTNING 7 XTORQ 130 (CATHETERS) ×1 IMPLANT
CATH TEMPO 5F RIM 65CM (CATHETERS) ×1 IMPLANT
CATH VERT 5X100 (CATHETERS) ×1 IMPLANT
COVER PROBE U/S 5X48 (MISCELLANEOUS) ×1 IMPLANT
GLIDEWIRE ADV .035X260CM (WIRE) ×1 IMPLANT
INTRODUCER 7FR 23CM (INTRODUCER) ×1 IMPLANT
SHEATH BALKIN 7FR (SHEATH) ×1 IMPLANT
SHEATH PINNACLE 5F 10CM (SHEATH) ×1 IMPLANT
SUT SILK 0 FSL (SUTURE) ×1 IMPLANT
WIRE G V18X300CM (WIRE) ×1 IMPLANT
WIRE GUIDERIGHT .035X150 (WIRE) ×1 IMPLANT

## 2021-05-15 NOTE — Progress Notes (Signed)
[facility]  Daily Progress Note   Subjective  -   Patient was doing well until the last hour or so when he began having pain in the right foot.  It was noted by nursing staff that he lost his pulse that had been previously easily dopplerable.  Objective Vitals:   04/19/2021 0100 04/15/2021 0200 04/30/2021 0300 04/17/2021 0500  BP: 127/82 115/60 119/60 (!) 120/57  Pulse: 76 71 72 73  Resp: 15 17 17  (!) 21  Temp:      TempSrc:      SpO2: 95% 94% 96% 95%  Weight:      Height:        Intake/Output Summary (Last 24 hours) at 05/03/2021 0809 Last data filed at 04/24/2021 0655 Gross per 24 hour  Intake 1412.5 ml  Output 700 ml  Net 712.5 ml    PULM  CTAB CV  RRR VASC  right foot is cool and no Doppler signals can be heard in either the dorsalis pedis or the posterior tibial location that were previously present  Laboratory CBC    Component Value Date/Time   WBC 8.5 04/25/2021 0434   HGB 11.8 (L) 05/07/2021 0434   HCT 34.8 (L) 05/11/2021 0434   PLT 136 (L) 04/25/2021 0434    BMET    Component Value Date/Time   NA 137 04/19/2021 0434   K 3.4 (L) 05/09/2021 0434   CL 104 05/02/2021 0434   CO2 24 04/29/2021 0434   GLUCOSE 107 (H) 04/16/2021 0434   BUN 12 04/19/2021 0434   CREATININE 0.87 04/29/2021 0434   CALCIUM 8.7 (L) 05/06/2021 0434   GFRNONAA >60 04/29/2021 0434   GFRAA >60 01/09/2020 01/11/2020    Assessment/Planning: POD #1 s/p percutaneous revascularization of the right lower extremity  Was doing well until early this morning when he began having some pain and lost his pulse in the right foot This is clearly a critical and limb threatening situation with a high risk of limb loss We will try to get him back down to the angiogram suite today and see if we can restore blood flow through his bypass to perfuse the right lower extremity If we are unable to get the bypass open, he likely be facing an amputation     [id number]  04/16/2021, 8:09  AM

## 2021-05-15 NOTE — Progress Notes (Signed)
Patient came out from vascular lab in distress. Patient put into bay 8. Leads attached and vitals taken. Patient pale, diaphoretic and minimally responsive. Placed in trandelanberg position. Patient stated that he could not stand it. Becoming less responsive and color got worse. Lips were white. Pads placed on patient and hooked up to zoll. Patient becoming agitated and confused. Urinated some, foley placed. Patient states he is going to vomit. Turned to his side. Patient states he is dying. Code Blue called at this time. Patient clenched his jaw, began to foam at mouth. Tried to use suction but could not open mouth. Patient then became unresponsive. No pulse detected, CPR started. Staff from cath lab, vascular and specials at bedside. ER response team then responded. Attempted resusitation for approximately 25 minutes. TOD pronounced at 1029AM.

## 2021-05-15 NOTE — Progress Notes (Signed)
°   04/25/2021 1050  Clinical Encounter Type  Visited With Family  Visit Type Initial  Referral From Nurse  Consult/Referral To Chaplain  Spiritual Encounters  Spiritual Needs Emotional;Grief support   Travis Salas attended ICU consult room where spouse was waiting. Chaplain Burris provifded a non-anxious presence and remained available for spouse during meeting with Dr. Wyn Quaker who disclosed that the Pt had passed. Chaplain Burris provided hospitality, assisted Travis Salas to the chapel, and helped her to process the news and offered assistance to cope with shock. Travis Salas urged her to seek out a friend and request their presence for additional support. Chaplain Burris communicated with staff to discuss viewing of the body and to assist with other needs.

## 2021-05-15 NOTE — Progress Notes (Signed)
°   05/12/2021 1230  Clinical Encounter Type  Visited With Family;Health care provider  Visit Type Follow-up;Death  Referral From Nurse  Consult/Referral To Chaplain  Spiritual Encounters  Spiritual Needs Prayer;Other (Comment)   Once notified by nursing staff, Chaplain Burris assisted Pt's spouse and friend to cath lab procedure area to view Pt's body and to have a time of prayer and closure.  Aurther Loft, RN was able to share some meaningful last words of the Pt and to offer some compassionate care to the bereaved spouse. Chaplain Burris stayed with this nurse to allow her to debrief and to offer appreciation and staff support.

## 2021-05-15 NOTE — Op Note (Signed)
[facility]  Percutaneous Study/Intervention Procedural Note   Date of Surgery: May 22, 2021  Surgeon(s):Kearie Mennen    Assistants:none  Pre-operative Diagnosis: PAD with rest pain, acute on chronic ischemia after revascularization the day before  Post-operative diagnosis:  Same  Procedure(s) Performed:             1.  Ultrasound guidance for vascular access left femoral artery             2.  Catheter placement into right tibioperoneal trunk from left femoral approach             3.  Selective right lower extremity angiogram             4.  Mechanical thrombectomy to the right femoral to distal bypass with the penumbra CAT 7 catheter             5.  Catheter directed thrombolytic therapy with 6 mg of tPA to the right femoral to distal bypass and placement of a 135 cm total length 50 cm working length thrombolytic catheter    EBL: 50 cc  Contrast: 30 cc  Fluoro Time: 7 minutes  Moderate Conscious Sedation Time: approximately 43 minutes using 4 mg of Versed and 100 mcg of Fentanyl              Indications:  Patient is a 66 y.o.male with recurrent acute on chronic ischemia of the right lower extremity.  He underwent revascularization yesterday, but lost his pulse this morning has rest pain.  The patient is brought in for angiography for further evaluation and potential treatment.  Due to the limb threatening nature of the situation, angiogram was performed for attempted limb salvage. The patient is aware that if the procedure fails, amputation would be expected.  The patient also understands that even with successful revascularization, amputation may still be required due to the severity of the situation.  Risks and benefits are discussed and informed consent is obtained.   Procedure:  The patient was identified and appropriate procedural time out was performed.  The patient was then placed supine on the table and prepped and draped in the usual sterile fashion.  Moderate conscious sedation was administered during a face to face encounter with the patient throughout the procedure with my supervision of the RN administering medicines and monitoring the patient's vital signs, pulse oximetry, telemetry and mental status throughout from the start of the procedure until the patient was taken to the recovery room. Ultrasound was used to evaluate the left common femoral artery.  It was patent .  A digital ultrasound image was acquired.  A Seldinger needle was used to access the left common femoral artery under direct ultrasound guidance and a permanent image was performed.  A 0.035 J wire was advanced without resistance and a 5Fr sheath was placed. I then crossed the aortic bifurcation and advanced to the right femoral head. Selective right lower extremity angiogram was then performed. This demonstrated recurrent occlusion of the femoral distal bypass extremely sluggish flow distally. It was felt that it was in the patient's best interest to proceed with intervention after these images to avoid a second procedure and a larger amount of contrast and fluoroscopy based off of the findings from the initial angiogram. The patient was systemically heparinized and a 7 [place] Balk[place] sheath was then placed over the [employer] wire. I then used a Kumpe catheter and the advantage wire to navigate into the bypass without difficulty and get down  to the tibioperoneal trunk where imaging showed thrombus in both the posterior tibial artery runoff as well as thrombosed jump graft to the anterior tibial artery.  We instilled 6 mg of tPA throughout the bypass and allowed this to dwell.  We then proceeded with mechanical thrombectomy within the bypass down to the tibioperoneal trunk.  2 passes were made with large chunks of thrombus removed.  Shortly into the procedure, the patient became increasingly agitated and it was clear we were not going to be able to continue at this time.  I elected to  place a thrombolytic catheter with hopes of bringing him back for angiogram later in the day.  We exchanged for a shorter sheath and placed a 135 cm total length 50 cm working length catheter from the origin of the bypass at the common femoral artery down to the tibioperoneal trunk at the distal bypass anastomosis.  This was secured in place with silk suture as was the sheath. I elected to terminate the procedure. The patient was taken to the recovery room   Findings:                         Right lower Extremity: Recurrent occlusion of the femoral to distal bypass as well as thrombus in the distal bypass anastomosis, the tibioperoneal trunk and proximal posterior tibial artery, and thrombosis of the jump graft to the anterior tibial artery as well   Disposition: Patient was taken to the recovery room  Complications: None  Festus Barren 10-Jun-2021 10:45 AM   This note was created with Dragon Medical transcription system. Any errors in dictation are purely unintentional.

## 2021-05-15 NOTE — Progress Notes (Signed)
Wedding ring given to patient's spouse Selena Batten by this nurse.

## 2021-05-15 NOTE — Progress Notes (Signed)
°   04/21/2021 1010  Clinical Encounter Type  Visited With Patient not available  Visit Type Code  Spiritual Encounters  Spiritual Needs Other (Comment)   Chaplain Burris responded to Code Blue in special procedures. Chaplain remained out side as care team worked to provide emergent treatment. Will f/u with family as needed; will await further instructions of updates regarding status and needs.

## 2021-05-15 NOTE — ED Provider Notes (Signed)
[facility] Department of Emergency Medicine   Code Blue CONSULT NOTE  Chief Complaint: Cardiac arrest/unresponsive   Level V Caveat: Unresponsive  History of present illness: I was contacted by the hospital for a CODE BLUE cardiac arrest upstairs and presented to the patient's bedside.  Upon presentation CPR in progress.  Brief history obtained from staff including admission for ischemic leg status post catheterization/angio.  Per nurse report patient had become nauseated and anxious prior to losing pulses and CPR was started immediately.   ROS: Unable to obtain, Level V caveat  Scheduled Meds:  [MAR Hold] amLODipine  5 mg Oral Daily   And   [MAR Hold] benazepril  20 mg Oral Daily   [MAR Hold] apixaban  5 mg Oral BID   [MAR Hold] Chlorhexidine Gluconate Cloth  6 each Topical Q0600   fentaNYL       heparin sodium (porcine)       midazolam       midazolam       [MAR Hold] multivitamin with minerals  1 tablet Oral QPM   ondansetron       ondansetron       [MAR Hold] rosuvastatin  20 mg Oral Daily   [MAR Hold] sertraline  100 mg Oral Daily   [MAR Hold] tamsulosin  0.4 mg Oral Daily   Continuous Infusions:  sodium chloride Stopped (05/14/2021 0846)   sodium chloride 20 mL/hr at 05/09/2021 0844   ceFAZolin      ceFAZolin (ANCEF) IV     heparin     tirofiban 0.15 mcg/kg/min (04/29/2021 0642)   PRN Meds:.[MAR Hold] acetaminophen **OR** [MAR Hold] acetaminophen, alteplase, fentaNYL, heparin sodium (porcine), iodixanol, [MAR Hold] magnesium hydroxide, midazolam, [MAR Hold]  morphine injection, [MAR Hold] ondansetron **OR** [MAR Hold] ondansetron (ZOFRAN) IV, ondansetron (ZOFRAN) IV, [MAR Hold] traZODone Past Medical History:  Diagnosis Date   Collagen vascular disease (HCC)    Hyperlipidemia    Hypertension    Peripheral vascular disease (HCC)    Past Surgical History:  Procedure Laterality Date   BYPASS GRAFT ANGIOGRAPHY Right 11/21/2019   Procedure: BYPASS  GRAFT ANGIOGRAPHY;  Surgeon: Griffith Citron, MD;  Location: [facility];  Service: Cardiovascular;  Laterality: Right;   CENTRAL LINE INSERTION Left 01/07/2020   Procedure: CENTRAL LINE INSERTION;  Surgeon: Annice Needy, MD;  Location: [facility] OR[facility];  Service: Vascular;  Laterality: Left;   ENDARTERECTOMY FEMORAL Right 12/23/2015   Procedure: COMMON, FEMORAL, SUPERFICIAL, AND PROFUNDA ENDARTERECTOMY;  Surgeon: Annice Needy, MD;  Location: [facility] OR[facility];  Service: Vascular;  Laterality: Right;   FEMORAL-TIBIAL BYPASS GRAFT Right 12/23/2015   Procedure: BYPASS GRAFT FEMORAL-TIBIAL ARTERY, COMMON FEMORAL AND PROFUNDIS FEMORAL WITH PATCH ANGIOPLASTY, POPLITEAL ANTERIOR ARTERY BYPASS, ILIOFEMORAL ANGIOGRAM VIABHAM STENT 8 MM DIAMETER X 5 CM LENGTH;  Surgeon: Annice Needy, MD;  Location: [facility];  Service: Vascular;  Laterality: Right;   INSERTION OF ILIAC STENT Right 01/07/2020   Procedure: INSERTION OF ILIAC STENT;  Surgeon: Annice Needy, MD;  Location: [facility];  Service: Vascular;  Laterality: Right;   LOWER EXTREMITY ANGIOGRAM Right 01/07/2020   Procedure: LOWER EXTREMITY ANGIOGRAM  insertion of infusion catheter ;  Surgeon: Annice Needy, MD;  Location: [facility];  Service: Vascular;  Laterality: Right;   LOWER EXTREMITY ANGIOGRAPHY Right 06/19/2019   Procedure: LOWER EXTREMITY ANGIOGRAPHY;  Surgeon: Annice Needy, MD;  Location: [facility];  Service: Cardiovascular;  Laterality: Right;   LOWER EXTREMITY ANGIOGRAPHY Right 06/20/2019  Procedure: Lower Extremity Angiography;  Surgeon: Annice Needy, MD;  Location: [facility];  Service: Cardiovascular;  Laterality: Right;   LOWER EXTREMITY ANGIOGRAPHY Right 01/08/2020   Procedure: Lower Extremity Angiography;  Surgeon: Annice Needy, MD;  Location: [facility];  Service: Cardiovascular;  Laterality: Right;   LOWER EXTREMITY ANGIOGRAPHY Right 05/04/2021   Procedure: Lower Extremity Angiography;  Surgeon: Annice Needy, MD;  Location: [facility]  INV[facility]IVE CV [facility];  Service: Cardiovascular;  Laterality: Right;   LOWER EXTREMITY INTERVENTION Right 04/16/2021   Procedure: LOWER EXTREMITY INTERVENTION;  Surgeon: Annice Needy, MD;  Location: [facility];  Service: Cardiovascular;  Laterality: Right;   PERIPHERAL VASCULAR CATHETERIZATION Right 11/08/2015   Procedure: Lower Extremity Angiography;  Surgeon: Annice Needy, MD;  Location: [facility];  Service: Cardiovascular;  Laterality: Right;   PERIPHERAL VASCULAR CATHETERIZATION Right 12/13/2015   Procedure: Lower Extremity Angiography;  Surgeon: Annice Needy, MD;  Location: [facility];  Service: Cardiovascular;  Laterality: Right;   PERIPHERAL VASCULAR THROMBECTOMY Right 11/21/2019   Procedure: PERIPHERAL VASCULAR THROMBECTOMY;  Surgeon: Griffith Citron, MD;  Location: [facility];  Service: Cardiovascular;  Laterality: Right;   PERIPHERAL VASCULAR THROMBECTOMY Right 11/22/2019   Procedure: PERIPHERAL VASCULAR THROMBECTOMY;  Surgeon: Griffith Citron, MD;  Location: [facility];  Service: Cardiovascular;  Laterality: Right;   Social History   Socioeconomic History   Marital status: Married    Spouse name: Kim   Number of children: Not on file   Years of education: Not on file   Highest education level: Not on file  Occupational History   Not on file  Tobacco Use   Smoking status: Former    Packs/day: 1.50    Years: 46.00    Pack years: 69.00    Types: Cigarettes    Quit date: 02/05/2015    Years since quitting: 6.2   Smokeless tobacco: Never  Vaping Use   Vaping Use: Never used  Substance and Sexual Activity   Alcohol use: Yes    Alcohol/week: 7.0 standard drinks    Types: 7 Cans of beer per week   Drug use: Yes    Types: Marijuana    Comment: occasional   Sexual activity: Not on file  Other Topics Concern   Not on file  Social History Narrative   Lives at home with wife in private residence   Social Determinants of Health   Financial  Resource Strain: Not on file  Food Insecurity: Not on file  Transportation Needs: Not on file  Physical Activity: Not on file  Stress: Not on file  Social Connections: Not on file  Intimate Partner Violence: Not on file   Allergies  Allergen Reactions   Hydrocodone Swelling    Last set of Vital Signs (not current) Vitals:   May 30, 2021 0815 2021-05-30 0834  BP:  140/76  Pulse: 84 76  Resp: 10 11  Temp:  98.3 F (36.8 C)  SpO2: 98% 96%      Physical Exam  Gen: unresponsive Cardiovascular: pulseless  Resp: apneic. Breath sounds equal bilaterally with bagging  Abd: nondistended  Neuro: GCS 3, unresponsive to pain  HEENT: No blood in posterior pharynx, gag reflex absent  Neck: No crepitus  Musculoskeletal: No deformity  Skin: warm  Procedures (when applicable, including Critical Care time): Procedures  Cardiopulmonary Resuscitation (CPR) Procedure Note Directed/Performed by: Minna Antis I personally directed ancillary staff and/or performed CPR in an effort  to regain return of spontaneous circulation and to maintain cardiac, neuro and systemic perfusion.   INTUBATION Performed by: Minna Antis  Required items: required blood products, implants, devices, and special equipment available Patient identity confirmed: provided demographic data and hospital-assigned identification number Time out: Immediately prior to procedure a "time out" was called to verify the correct patient, procedure, equipment, support staff and site/side marked as required.  Indications: Cardiac arrest  Intubation method: S4 Glidescope Laryngoscopy   Preoxygenation: 100% BVM  Sedatives: No medication Paralytic: No medication  Tube Size: 8.0 cuffed  Post-procedure assessment: chest rise and ETCO2 monitor Breath sounds: equal and absent over the epigastrium Tube secured with: ETT holder  CRITICAL CARE Performed by: Minna Antis   Total critical care time: 30  minutes  Critical care time was exclusive of separately billable procedures and treating other patients.  Critical care was necessary to treat or prevent imminent or life-threatening deterioration.  Critical care was time spent personally by me on the following activities: development of treatment plan with patient and/or surrogate as well as nursing, discussions with consultants, evaluation of patient's response to treatment, examination of patient, obtaining history from patient or surrogate, ordering and performing treatments and interventions, ordering and review of laboratory studies, ordering and review of radiographic studies, pulse oximetry and re-evaluation of patient's condition.      MDM / Assessment and Plan Arrived patient's bedside CPR in progress.  Patient was being bagged and chest compressions were being administered.  I was able to intubate the patient with no medications.  Continued CPR for approximately 25 minutes total with multiple rounds of epinephrine given as well as sodium bicarb.  Patient maintained PEA arrest throughout attempted resuscitation.  Bedside ultrasound showed a negative FAST exam.  Minimal cardiac activity, and confirmed no pulse with Doppler.  After approximately 25 minutes of CPR without pulses time of death was called at 10:29 AM.    Minna Antis, MD June 12, 2021 1041

## 2021-05-15 NOTE — Plan of Care (Signed)
Continuing with plan of care. 

## 2021-05-15 NOTE — Progress Notes (Signed)
At 4 hour neurovascular check patient's pedal pulse was absent on right foot. Tibial pulse is sluggish. Right foot is cool with an ashen color. Patient denies any pain.

## 2021-05-15 NOTE — Progress Notes (Signed)
Informed Dr.Dew of current findings, received orders to administer an aggrastat bolus and continuous drip.

## 2021-05-15 NOTE — Death Summary Note (Signed)
Death Summary  Travis Salas Salas:[id number] DOB: March 28, 1956 DOA: 05-20-2021  PCP: Jaclyn Shaggy, MD  Admit date: May 20, 2021 Date of Death: 22-May-2021  Final Diagnoses:  Principal Problem:   Pain of right lower extremity due to ischemia Active Problems:   Lower limb ischemia     History of present illness:  HPI was taken from Dr. Arville Care: Travis Salas is a 66 y.o. male with medical history significant for hypertension, dyslipidemia, peripheral vascular disease status post femoral-popliteal bypass on Eliquis and collagen vascular disease, who presented to the ER with acute onset of right leg and foot pain that started last night.  She noted her right foot was colder to touch and paler than usual.  No leg or foot discoloration.  She has been compliant with her medications and Eliquis.  She denied any chest pain or palpitations.  No cough or wheezing or hemoptysis.  No fever or chills.  The patient is an ex-smoker since 2016.  ED Course: Upon presentation to the emergency room, vital signs were within normal.  Labs revealed mild leukocytosis of 11.9 with neutrophilia and unremarkable CMP.  INR was 1.3 and PT 16.2.     EKG as reviewed by me : EKG showed normal sinus rhythm with a rate of 84 with prolonged QT interval with QTC of 484 MS and poor R wave progression.   Dr. Wyn Quaker was contacted and the patient is expected to have an angiography in a.m.  He was started on IV heparin.  He will be admitted to a medical bed for further evaluation and management.   As per Dr. Nelson Chimes: Travis Salas is a 66 y.o. male with medical history significant for hypertension, dyslipidemia, peripheral vascular disease status post femoral-popliteal bypass on Eliquis and collagen vascular disease, who presented to the ER with acute onset of right leg and foot pain that started last night.  Admitted for acute on chronic right lower extremity ischemia.  Vascular surgery was consulted and he was taken to the OR,  found to have occluded graft, tPA catheter was placed and he was transferred to ICU for tPA.  As per Dr. Mayford Knife 05-22-2021: This physician never saw the pt as by the time I started rounding, the pt was already in the OR. Pt was evidently having pain in RLE and had lost of his pulse in the right foot this morning. Pt was taken back to the OR  by vascular surg for: Right lower extremity angiogram. Mechanical thrombectomy using the penumbra CAT 6 device to the right femoral to distal bypass and the right tibioperoneal trunk and posterior tibial arteries. Percutaneous transluminal angioplasty of the right posterior tibial artery and the distal bypass graft anastomosis with 3 mm diameter by 20 cm length angioplasty balloon. Percutaneous transluminal angioplasty of jump graft from the main bypass to the anterior tibial artery in the proximal to mid anterior tibial artery with 3 mm diameter by 20 cm length angioplasty balloon. Viabahn stent placement to the proximal portion of the right femoral to distal bypass with a 7 mm diameter by 25 cm length stent. StarClose closure device left femoral artery. Sometime after, a code blue was called and CPR was done but unfortunately pt passed away. Please see previous progress/consult notes for more information.      Hospital Course:   Acute on chronic right lower extremity ischemia:  w/ hx of PAD s/p femoral-popliteal bypass, which was found to be occluded during angiography. See above. Continue on eliquis as per  vascular surg    HTN: continue on home dose of amlodipine, benazepril     HLD: continue on home dose of statin   Depression: severity unknown. Continue on home dose of zoloft  BPH: continue on home dose of flomax   Peripheral neuropathy: continue on home dose of gabapentin    Time: < 30 mins Time of death: 10:29AM  Signed:  Charise Killian  [facility] 05/11/2021, 3:18 PM

## 2021-05-15 NOTE — Progress Notes (Signed)
Report given to receiving RN in specials, transport arrived and transferred the patient in hospital bed, patient in stable condition.

## 2021-05-15 DEATH — deceased

## 2021-05-17 ENCOUNTER — Encounter: Payer: Self-pay | Admitting: Vascular Surgery

## 2021-08-23 ENCOUNTER — Encounter (INDEPENDENT_AMBULATORY_CARE_PROVIDER_SITE_OTHER): Payer: Medicare Other

## 2021-08-23 ENCOUNTER — Ambulatory Visit (INDEPENDENT_AMBULATORY_CARE_PROVIDER_SITE_OTHER): Payer: Medicare Other | Admitting: Vascular Surgery
